# Patient Record
Sex: Female | Born: 1995 | Race: White | Hispanic: No | Marital: Single | Smoking: Never smoker
Health system: Southern US, Community
[De-identification: ages and names within clinical notes are randomized; demographics above are authoritative.]

## PROBLEM LIST (undated history)

## (undated) DIAGNOSIS — J3081 Allergic rhinitis due to animal (cat) (dog) hair and dander: Secondary | ICD-10-CM

## (undated) DIAGNOSIS — E119 Type 2 diabetes mellitus without complications: Secondary | ICD-10-CM

## (undated) DIAGNOSIS — I10 Essential (primary) hypertension: Secondary | ICD-10-CM

## (undated) DIAGNOSIS — J45909 Unspecified asthma, uncomplicated: Secondary | ICD-10-CM

## (undated) DIAGNOSIS — T7840XA Allergy, unspecified, initial encounter: Secondary | ICD-10-CM

## (undated) DIAGNOSIS — F32A Depression, unspecified: Secondary | ICD-10-CM

## (undated) DIAGNOSIS — F329 Major depressive disorder, single episode, unspecified: Secondary | ICD-10-CM

## (undated) HISTORY — DX: Major depressive disorder, single episode, unspecified: F32.9

## (undated) HISTORY — DX: Essential (primary) hypertension: I10

## (undated) HISTORY — DX: Allergy, unspecified, initial encounter: T78.40XA

## (undated) HISTORY — DX: Allergic rhinitis due to animal (cat) (dog) hair and dander: J30.81

## (undated) HISTORY — PX: ADENOIDECTOMY: SUR15

## (undated) HISTORY — DX: Depression, unspecified: F32.A

## (undated) HISTORY — DX: Unspecified asthma, uncomplicated: J45.909

---
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
## 2013-08-05 ENCOUNTER — Ambulatory Visit: Payer: Self-pay | Admitting: Unknown Physician Specialty

## 2013-09-15 ENCOUNTER — Encounter: Payer: Self-pay | Admitting: Internal Medicine

## 2013-09-15 ENCOUNTER — Encounter: Payer: Self-pay | Admitting: *Deleted

## 2013-09-15 ENCOUNTER — Ambulatory Visit (INDEPENDENT_AMBULATORY_CARE_PROVIDER_SITE_OTHER): Payer: BC Managed Care – PPO | Admitting: Internal Medicine

## 2013-09-15 ENCOUNTER — Other Ambulatory Visit (INDEPENDENT_AMBULATORY_CARE_PROVIDER_SITE_OTHER): Payer: BC Managed Care – PPO

## 2013-09-15 VITALS — BP 130/78 | HR 69 | Ht 65.0 in | Wt 236.0 lb

## 2013-09-15 DIAGNOSIS — R05 Cough: Secondary | ICD-10-CM

## 2013-09-15 DIAGNOSIS — J45909 Unspecified asthma, uncomplicated: Secondary | ICD-10-CM

## 2013-09-15 LAB — CBC WITH DIFFERENTIAL/PLATELET
Basophils Relative: 0.6 % (ref 0.0–3.0)
Eosinophils Relative: 1.2 % (ref 0.0–5.0)
HCT: 36.6 % (ref 36.0–46.0)
Lymphocytes Relative: 36.8 % (ref 12.0–46.0)
MCHC: 33.2 g/dL (ref 30.0–36.0)
Monocytes Absolute: 0.3 10*3/uL (ref 0.1–1.0)
Neutro Abs: 3.4 10*3/uL (ref 1.4–7.7)
Neutrophils Relative %: 55.7 % (ref 43.0–77.0)
RBC: 4.91 Mil/uL (ref 3.87–5.11)
RDW: 17.7 % — ABNORMAL HIGH (ref 11.5–14.6)
WBC: 6 10*3/uL (ref 4.5–10.5)

## 2013-09-15 MED ORDER — BECLOMETHASONE DIPROPIONATE 80 MCG/ACT IN AERS: 2.0000 | INHALATION_SPRAY | Freq: Two times a day (BID) | RESPIRATORY_TRACT | Status: DC

## 2013-09-15 NOTE — Progress Notes (Signed)
  Subjective:    [name], female    DOB: Mar 13, 1996  MRN: [id number]  HPI  63 yowf never smoker with lots of cough and sinus problems esp in spring and fall  as infant seen by [facility] allergy ENT pos tests but never shots some better after adenoids out and rx with nasal sprays and eventually stopped them but ever since then in spring and fall lots of problems with nasal congestion and lots of sneezing and fall of 2014  started with shortness of breath, cough and wheezing better on inhalers and referred 09/15/2013   09/15/2013 1st [place] [facility] office visit/ Sherene Sires / on [facility] Chief Complaint  Patient presents with  . Pulmonary Consult    Self referral.  Sob w/ exertion  does ok at rest and lying down but variable doe depending on speed and incline x months, some better p saba  inhalers but has never rechallenged.  Give aerospan but not using consistently  No obvious pattern in  day to day or daytime variabilty or assoc chronic cough or cp or chest tightness, subjective wheeze overt   hb symptoms. No unusual exp hx or h/o childhood pna/ asthma or knowledge of premature birth.  Sleeping ok without nocturnal  or early am exacerbation  of respiratory  c/o's or need for noct saba. Also denies any obvious fluctuation of symptoms with weather or environmental changes or other aggravating or alleviating factors except as outlined above   Current Medications, Allergies, Complete Past Medical History, Past Surgical History, Family History, and Social History were reviewed in [employer] record.   .       Review of Systems  Constitutional: Negative for fever and unexpected weight change.  HENT: Positive for congestion, sinus pressure and sneezing. Negative for dental problem, ear pain, nosebleeds, postnasal drip, rhinorrhea, sore throat and trouble swallowing.   Eyes: Negative for redness and itching.  Respiratory: Positive for cough, chest tightness, shortness  of breath and wheezing.   Cardiovascular: Negative for palpitations and leg swelling.  Gastrointestinal: Negative for nausea and vomiting.  Genitourinary: Negative for dysuria.  Musculoskeletal: Negative for joint swelling.  Skin: Positive for rash.  Neurological: Positive for headaches.  Hematological: Does not bruise/bleed easily.  Psychiatric/Behavioral: Positive for dysphoric mood. The patient is nervous/anxious.        Objective:   Physical Exam  amb obese wf nad Wt Readings from Last 3 Encounters:  09/15/13 236 lb (107.049 kg) (99%*, Z = 2.37)   * Growth percentiles are based on CDC 2-20 Years data.      HEENT: nl dentition, turbinates, and orophanx. Nl external ear canals without cough reflex   NECK :  without JVD/Nodes/TM/ nl carotid upstrokes bilaterally   LUNGS: no acc muscle use, clear to A and P bilaterally without cough on insp or exp maneuvers   CV:  RRR  no s3 or murmur or increase in P2, no edema   ABD:  soft and nontender with nl excursion in the supine position. No bruits or organomegaly, bowel sounds nl  MS:  warm without deformities, calf tenderness, cyanosis or clubbing  SKIN: warm and dry without lesions    NEURO:  alert, approp, no deficits    Spirometry 09/05/13 wnl     Assessment & Plan:

## 2013-09-15 NOTE — Patient Instructions (Signed)
Start Qvar 80 Take 2 puffs first thing in am and then another 2 puffs about 12 hours later.   Only use your albuterol as a rescue medication to be used if you can't catch your breath by resting or doing a relaxed purse lip breathing pattern.  - The less you use it, the better it will work when you need it. - Ok to use up to every 4 hours if you must but call for immediate appointment if use goes up over your usual need - Don't leave home without it !!  (think of it like your spare tire for your car)   You appear to have mild chronic asthma which should respond to appropriate maintenance inhalers like qvar but may need to be adjusted at your next visit in 4 weeks until we get the prescription right for you.  In the meantime you may be limited in terms of exertion especially up hills or in the cold and your teachers need to be aware of your limitations caused by asthma.  Please remember to go to the lab   department downstairs for your tests - we will call you with the results when they are available.  Please schedule a follow up office visit in 4 weeks, sooner if needed

## 2013-09-16 ENCOUNTER — Other Ambulatory Visit: Payer: Self-pay | Admitting: Internal Medicine

## 2013-09-16 ENCOUNTER — Encounter: Payer: Self-pay | Admitting: Internal Medicine

## 2013-09-16 ENCOUNTER — Telehealth: Payer: Self-pay | Admitting: Internal Medicine

## 2013-09-16 ENCOUNTER — Other Ambulatory Visit (INDEPENDENT_AMBULATORY_CARE_PROVIDER_SITE_OTHER): Payer: BC Managed Care – PPO

## 2013-09-16 ENCOUNTER — Ambulatory Visit (INDEPENDENT_AMBULATORY_CARE_PROVIDER_SITE_OTHER)
Admission: RE | Admit: 2013-09-16 | Discharge: 2013-09-16 | Disposition: A | Discharge status: Home / Self Care | Payer: BC Managed Care – PPO | Source: Ambulatory Visit | Attending: Internal Medicine | Admitting: Internal Medicine

## 2013-09-16 DIAGNOSIS — R791 Abnormal coagulation profile: Secondary | ICD-10-CM

## 2013-09-16 DIAGNOSIS — R0600 Dyspnea, unspecified: Secondary | ICD-10-CM | POA: Insufficient documentation

## 2013-09-16 DIAGNOSIS — R0609 Other forms of dyspnea: Secondary | ICD-10-CM

## 2013-09-16 DIAGNOSIS — R7989 Other specified abnormal findings of blood chemistry: Secondary | ICD-10-CM

## 2013-09-16 LAB — ALLERGY PROFILE REGION II-DC, DE, MD, ~~LOC~~, VA
Allergen, D pternoyssinus,d7: 18.1 kU/L — ABNORMAL HIGH
Aspergillus fumigatus, m3: <0.1 kU/L
Bermuda Grass: 12.5 kU/L — ABNORMAL HIGH
Box Elder IgE: <0.1 kU/L
Cladosporium Herbarum: <0.1 kU/L
Cockroach: <0.1 kU/L
Common Ragweed: <0.1 kU/L
Lamb's Quarters: 0.15 kU/L — ABNORMAL HIGH
Meadow Grass: 38.8 kU/L — ABNORMAL HIGH
Oak: <0.1 kU/L

## 2013-09-16 LAB — BASIC METABOLIC PANEL
CO2: 23 mEq/L (ref 19–32)
Chloride: 106 mEq/L (ref 96–112)
Glucose, Bld: 120 mg/dL — ABNORMAL HIGH (ref 70–99)
Potassium: 4.2 mEq/L (ref 3.5–5.1)
Sodium: 136 mEq/L (ref 135–145)

## 2013-09-16 MED ORDER — IOHEXOL 350 MG/ML SOLN
80.0000 mL | Freq: Once | INTRAVENOUS | Status: AC | PRN
  Administered 2013-09-16: 80 mL via INTRAVENOUS

## 2013-09-16 NOTE — Telephone Encounter (Signed)
Letter printed and signed giving to pt. Nothing further needed

## 2013-09-16 NOTE — Telephone Encounter (Signed)
Pt was made aware of normal CT results.

## 2013-09-16 NOTE — Progress Notes (Signed)
Quick Note:  Spoke with the pt's mother and notified of recs per MW  She verbalized understanding  Order sent to [facility]- per Almyra Free the VQ can not be done today, so cta ordered ______

## 2013-09-16 NOTE — Telephone Encounter (Signed)
Chest ct today Amy Olson

## 2013-09-16 NOTE — Progress Notes (Signed)
Quick Note:  LMTCB on pt's cell  Called number listed for mother and it has been d/c'ed  WCB ______

## 2013-09-16 NOTE — Progress Notes (Signed)
Quick Note:  Spoke with pt and notified of results per Dr. Wert. Pt verbalized understanding and denied any questions.  ______ 

## 2013-09-16 NOTE — Telephone Encounter (Signed)
I called and spoke with pt mother. She reports she had a phone call from [place]. She thinks it was to scheduled pt CTa scan. Please advise PCC's thanks

## 2013-09-16 NOTE — Assessment & Plan Note (Addendum)
-   Spirometry wnl 09/15/2013 including fef 25-75 s rx x > 12 hours   Symptoms are markedly disproportionate to objective findings and not clear this is a lung problem but pt does appear to have difficult airway management issues. DDX of  difficult airways managment all start with A and  include Adherence, Ace Inhibitors, Acid Reflux, Active Sinus Disease, Alpha 1 Antitripsin deficiency, Anxiety masquerading as Airways dz,  ABPA,  allergy(esp in young), Aspiration (esp in elderly), Adverse effects of DPI,  Active smokers, plus two Bs  = Bronchiectasis and Beta blocker use..and one C= CHF  In this case Adherence is the biggest issue and starts with  inability to use HFA effectively and also  understand that SABA treats the symptoms but doesn't get to the underlying problem (inflammation).  I used  the analogy of putting steroid cream on a rash to help explain the meaning of topical therapy and the need to get the drug to the target tissue.   - try qvar 80 2bid and see if this reduces saba use - The proper method of use, as well as anticipated side effects, of a metered-dose inhaler are discussed and demonstrated to the patient. Improved effectiveness after extensive coaching during this visit to a level of approximately  75%   ? Anxiety / obesity/ deconditioning all on list also >  Dx of exclusion  BCP use raises issue of occult PE and d dimer is slt elevated so needs to be exlcuded; also puts her at risk of Acid and non - acid gerd  See instructions for specific recommendations which were reviewed directly with the patient who was given a copy with highlighter outlining the key components.

## 2013-10-09 ENCOUNTER — Institutional Professional Consult (permissible substitution): Payer: Self-pay | Admitting: Pulmonary Disease

## 2013-10-13 ENCOUNTER — Ambulatory Visit (INDEPENDENT_AMBULATORY_CARE_PROVIDER_SITE_OTHER): Payer: No Typology Code available for payment source | Admitting: Internal Medicine

## 2013-10-13 ENCOUNTER — Encounter: Payer: Self-pay | Admitting: Internal Medicine

## 2013-10-13 VITALS — BP 126/80 | HR 90 | Temp 99.0°F | Ht 66.0 in | Wt 240.0 lb

## 2013-10-13 DIAGNOSIS — J45909 Unspecified asthma, uncomplicated: Secondary | ICD-10-CM

## 2013-10-13 MED ORDER — BECLOMETHASONE DIPROPIONATE 80 MCG/ACT IN AERS: 2.0000 | INHALATION_SPRAY | Freq: Two times a day (BID) | RESPIRATORY_TRACT | Status: DC

## 2013-10-13 NOTE — Progress Notes (Signed)
Subjective:    [name], female    DOB: Jun 05, 1996  MRN: [id number]  HPI  72 yowf never smoker with lots of cough and sinus problems esp in spring and fall  as infant seen by [facility] allergy ENT pos tests but never shots some better after adenoids out and rx with nasal sprays and eventually stopped them but ever since then in spring and fall lots of problems with nasal congestion and lots of sneezing and fall of 2014  started with shortness of breath, cough and wheezing better on inhalers and referred 09/15/2013   09/15/2013 1st [place] [facility] office visit/ Sherene Sires / on [facility] Chief Complaint  Patient presents with  . Pulmonary Consult    Self referral.  Sob w/ exertion  does ok at rest and lying down but variable doe depending on speed and incline x months, some better p saba  inhalers but has never rechallenged.  Give aerospan but not using consistently. rec Start Qvar 80 Take 2 puffs first thing in am and then another 2 puffs about 12 hours later.  Only use your albuterol   You appear to have mild chronic asthma    10/13/2013 f/u ov/[place] re: asthma Chief Complaint  Patient presents with  . Follow-up    Pt states her breathing is doing much better since last visit. Has only used rescue inhaler x 1 since her last visit. No new co's today.     Now able to jog.  No obvious day to day or daytime variabilty or assoc chronic cough or cp or chest tightness, subjective wheeze overt sinus or hb symptoms. No unusual exp hx or h/o childhood pna/ asthma or knowledge of premature birth.  Sleeping ok without nocturnal  or early am exacerbation  of respiratory  c/o's or need for noct saba. Also denies any obvious fluctuation of symptoms with weather or environmental changes or other aggravating or alleviating factors except as outlined above   Current Medications, Allergies, Complete Past Medical History, Past Surgical History, Family History, and Social History were reviewed in  [employer] record.  ROS  The following are not active complaints unless bolded sore throat, dysphagia, dental problems, itching, sneezing,  nasal congestion or excess/ purulent secretions, ear ache,   fever, chills, sweats, unintended wt loss, pleuritic or exertional cp, hemoptysis,  orthopnea pnd or leg swelling, presyncope, palpitations, heartburn, abdominal pain, anorexia, nausea, vomiting, diarrhea  or change in bowel or urinary habits, change in stools or urine, dysuria,hematuria,  rash, arthralgias, visual complaints, headache, numbness weakness or ataxia or problems with walking or coordination,  change in mood/affect or memory.           .              Objective:   Physical Exam  amb obese wf nad  Wt Readings from Last 3 Encounters:  10/13/13 240 lb (108.863 kg) (99%*, Z = 2.41)  09/15/13 236 lb (107.049 kg) (99%*, Z = 2.37)   * Growth percentiles are based on CDC 2-20 Years data.     HEENT: nl dentition, turbinates, and orophanx. Nl external ear canals without cough reflex   NECK :  without JVD/Nodes/TM/ nl carotid upstrokes bilaterally   LUNGS: no acc muscle use, clear to A and P bilaterally without cough on insp or exp maneuvers   CV:  RRR  no s3 or murmur or increase in P2, no edema   ABD:  soft and nontender with nl excursion  in the supine position. No bruits or organomegaly, bowel sounds nl  MS:  warm without deformities, calf tenderness, cyanosis or clubbing  SKIN: warm and dry without lesions      CTa 09/16/13 Negative for pulmonary embolus.  Trace right pleural effusion.  Fatty infiltration of the liver.     Spirometry 09/05/13 wnl     Assessment & Plan:

## 2013-10-13 NOTE — Patient Instructions (Addendum)
continue qvar 80 Take 2 puffs first thing in am and then another 2 puffs about 12 hours later.    Please schedule a follow up visit in 3 months but call sooner if needed

## 2013-10-13 NOTE — Assessment & Plan Note (Signed)
-   Spirometry wnl 09/15/2013 including fef 25-75 s rx x > 12 hours  - Allergy profile 09/15/13 > IgE 119.7 grass, cat dog dust and pecan trees  The proper method of use, as well as anticipated side effects, of a metered-dose inhaler are discussed and demonstrated to the patient. Improved effectiveness after extensive coaching during this visit to a level of approximately  90%  All goals of chronic asthma control met including optimal function and elimination of symptoms with minimal need for rescue therapy.  Contingencies discussed in full including contacting this office immediately if not controlling the symptoms using the rule of two's.     See instructions for specific recommendations which were reviewed directly with the patient who was given a copy with highlighter outlining the key components.

## 2013-12-14 ENCOUNTER — Encounter (HOSPITAL_COMMUNITY): Payer: Self-pay | Admitting: Emergency Medicine

## 2013-12-14 ENCOUNTER — Emergency Department (HOSPITAL_COMMUNITY)
Admission: EM | Admit: 2013-12-14 | Discharge: 2013-12-14 | Disposition: A | Discharge status: Home / Self Care | Payer: No Typology Code available for payment source | Source: Home / Self Care | Attending: Family Medicine | Admitting: Family Medicine

## 2013-12-14 DIAGNOSIS — J329 Chronic sinusitis, unspecified: Secondary | ICD-10-CM

## 2013-12-14 MED ORDER — IPRATROPIUM BROMIDE 0.06 % NA SOLN: 2.0000 | Freq: Four times a day (QID) | NASAL | Status: DC

## 2013-12-14 MED ORDER — PREDNISONE 10 MG PO TABS: 30.0000 mg | ORAL_TABLET | Freq: Every day | ORAL | Status: DC

## 2013-12-14 MED ORDER — AMOXICILLIN-POT CLAVULANATE 875-125 MG PO TABS: 1.0000 | ORAL_TABLET | Freq: Two times a day (BID) | ORAL | Status: DC

## 2013-12-14 NOTE — Discharge Instructions (Signed)
Thank you for coming in today. Take Augmentin twice daily for one week. Use of prednisone daily for 5 days. Use Atrovent nasal spray as needed. Take up to 2 Aleve twice daily for pain fevers or chills. Call or go to the emergency room if you get worse, have trouble breathing, have chest pains, or palpitations.   Sinusitis Sinusitis is redness, soreness, and swelling (inflammation) of the paranasal sinuses. Paranasal sinuses are air pockets within the bones of your face (beneath the eyes, the middle of the forehead, or above the eyes). In healthy paranasal sinuses, mucus is able to drain out, and air is able to circulate through them by way of your nose. However, when your paranasal sinuses are inflamed, mucus and air can become trapped. This can allow bacteria and other germs to grow and cause infection. Sinusitis can develop quickly and last only a short time (acute) or continue over a long period (chronic). Sinusitis that lasts for more than 12 weeks is considered chronic.  CAUSES  Causes of sinusitis include:  Allergies.  Structural abnormalities, such as displacement of the cartilage that separates your nostrils (deviated septum), which can decrease the air flow through your nose and sinuses and affect sinus drainage.  Functional abnormalities, such as when the small hairs (cilia) that line your sinuses and help remove mucus do not work properly or are not present. SYMPTOMS  Symptoms of acute and chronic sinusitis are the same. The primary symptoms are pain and pressure around the affected sinuses. Other symptoms include:  Upper toothache.  Earache.  Headache.  Bad breath.  Decreased sense of smell and taste.  A cough, which worsens when you are lying flat.  Fatigue.  Fever.  Thick drainage from your nose, which often is green and may contain pus (purulent).  Swelling and warmth over the affected sinuses. DIAGNOSIS  Your caregiver will perform a physical exam. During the  exam, your caregiver may:  Look in your nose for signs of abnormal growths in your nostrils (nasal polyps).  Tap over the affected sinus to check for signs of infection.  View the inside of your sinuses (endoscopy) with a special imaging device with a light attached (endoscope), which is inserted into your sinuses. If your caregiver suspects that you have chronic sinusitis, one or more of the following tests may be recommended:  Allergy tests.  Nasal culture A sample of mucus is taken from your nose and sent to a lab and screened for bacteria.  Nasal cytology A sample of mucus is taken from your nose and examined by your caregiver to determine if your sinusitis is related to an allergy. TREATMENT  Most cases of acute sinusitis are related to a viral infection and will resolve on their own within 10 days. Sometimes medicines are prescribed to help relieve symptoms (pain medicine, decongestants, nasal steroid sprays, or saline sprays).  However, for sinusitis related to a bacterial infection, your caregiver will prescribe antibiotic medicines. These are medicines that will help kill the bacteria causing the infection.  Rarely, sinusitis is caused by a fungal infection. In theses cases, your caregiver will prescribe antifungal medicine. For some cases of chronic sinusitis, surgery is needed. Generally, these are cases in which sinusitis recurs more than 3 times per year, despite other treatments. HOME CARE INSTRUCTIONS   Drink plenty of water. Water helps thin the mucus so your sinuses can drain more easily.  Use a humidifier.  Inhale steam 3 to 4 times a day (for example, sit in  the bathroom with the shower running).  Apply a warm, moist washcloth to your face 3 to 4 times a day, or as directed by your caregiver.  Use saline nasal sprays to help moisten and clean your sinuses.  Take over-the-counter or prescription medicines for pain, discomfort, or fever only as directed by your  caregiver. SEEK IMMEDIATE MEDICAL CARE IF:  You have increasing pain or severe headaches.  You have nausea, vomiting, or drowsiness.  You have swelling around your face.  You have vision problems.  You have a stiff neck.  You have difficulty breathing. MAKE SURE YOU:   Understand these instructions.  Will watch your condition.  Will get help right away if you are not doing well or get worse. Document Released: 10/09/2005 Document Revised: 01/01/2012 Document Reviewed: 10/24/2011 [facility] Patient Information 2014 [employer] [place].

## 2013-12-14 NOTE — ED Provider Notes (Signed)
Amy Olson is a 18 y.o. female who presents to Urgent Care today for left facial pain and pressure with nasal discharge. This is been mildly present for the past 2 weeks however she was improving until about 2 days ago when she became rapidly worse. She's tried some over-the-counter medications which have not helped much. She denies any trouble breathing fever chills nausea vomiting or diarrhea. She has a history of sinusitis in her current symptoms are consistent with prior episodes of sinus infections.   Past Medical History  Diagnosis Date  . allergies    History  Substance Use Topics  . Smoking status: Never Smoker   . Smokeless tobacco: Not on file  . Alcohol Use: No   ROS as above Medications: No current facility-administered medications for this encounter.   Current Outpatient Prescriptions  Medication Sig Dispense Refill  . albuterol (PROVENTIL HFA;VENTOLIN HFA) 108 (90 BASE) MCG/ACT inhaler Inhale 2 puffs into the lungs every 6 (six) hours as needed for wheezing or shortness of breath.      . beclomethasone (QVAR) 80 MCG/ACT inhaler Inhale 2 puffs into the lungs 2 (two) times daily. Take 2 puffs first thing in am and then another 2 puffs about 12 hours later.  1 Inhaler  3  . desogestrel-ethinyl estradiol (APRI) 0.15-30 MG-MCG tablet Take 1 tablet by mouth daily.      Marland Kitchen. escitalopram (LEXAPRO) 10 MG tablet Take 10 mg by mouth daily.      . metFORMIN (GLUCOPHAGE) 500 MG tablet Take 500 mg by mouth 2 (two) times daily with a meal.      . amoxicillin-clavulanate (AUGMENTIN) 875-125 MG per tablet Take 1 tablet by mouth every 12 (twelve) hours.  14 tablet  0  . ipratropium (ATROVENT) 0.06 % nasal spray Place 2 sprays into both nostrils 4 (four) times daily.  15 mL  1  . predniSONE (DELTASONE) 10 MG tablet Take 3 tablets (30 mg total) by mouth daily.  15 tablet  0    Exam:  BP 143/86  Pulse 90  Temp(Src) 98.5 F (36.9 C) (Oral)  Resp 16  SpO2 100%  LMP 12/11/2013 Gen: Well  NAD HEENT: EOMI,  MMM tender palpation left maxillary sinus. Tympanic membranes are normal bilaterally as is posterior pharynx. Clear nasal discharge present Lungs: Normal work of breathing. CTABL Heart: RRR no MRG Abd: NABS, Soft. NT, ND Exts: Brisk capillary refill, warm and well perfused.    Assessment and Plan: 18 y.o. female with sinusitis. Plan to treat with prednisone Augmentin and Atrovent nasal spray. Continue NSAIDs as needed for pain  Discussed warning signs or symptoms. Please see discharge instructions. Patient expresses understanding.    Rodolph BongEvan S Arsal Tappan, MD 12/14/13 [phone]

## 2013-12-14 NOTE — ED Notes (Signed)
C/o sinus pressure and pain.  Post nasal drip.  Fever.  Sneezing.  Green mucus.  And nausea.  Symptoms present x 2 wks.  Denies v/d.     Mild relief with otc meds.

## 2013-12-30 ENCOUNTER — Ambulatory Visit (INDEPENDENT_AMBULATORY_CARE_PROVIDER_SITE_OTHER): Payer: No Typology Code available for payment source | Admitting: Internal Medicine

## 2013-12-30 ENCOUNTER — Encounter: Payer: Self-pay | Admitting: Internal Medicine

## 2013-12-30 VITALS — BP 130/84 | HR 87 | Temp 98.7°F | Ht 66.0 in | Wt 244.8 lb

## 2013-12-30 DIAGNOSIS — J45909 Unspecified asthma, uncomplicated: Secondary | ICD-10-CM

## 2013-12-30 NOTE — Patient Instructions (Addendum)
continue qvar 80 Take 2 puffs first thing in am and then another 2 puffs about 12 hours later- ok to drop off pm doses after spring if doing great.   Please schedule a follow up visit in 12 months but call sooner if needed

## 2013-12-30 NOTE — Assessment & Plan Note (Signed)
-   Spirometry wnl 09/15/2013 including fef 25-75 s rx x > 12 hours  - Allergy profile 09/15/13 > IgE 119.7 grass, cat dog dust and pecan trees - 90% 10/13/2013   Adequate control on present rx, reviewed > no change in rx needed  Except ok to reduce the qvar to 80 2 puffs each am once spring over

## 2013-12-30 NOTE — Progress Notes (Signed)
Subjective:    [name], female    DOB: 19-Sep-1996  MRN: [id number]    Brief patient profile:  [age] yowf never smoker with lots of cough and sinus problems esp in spring and fall  as infant seen by [place] allergy ENT pos tests but never shots some better after adenoids out and rx with nasal sprays and eventually stopped them but ever since then in spring and fall lots of problems with nasal congestion and lots of sneezing and fall of 2014  started with shortness of breath, cough and wheezing better on inhalers and referred 09/15/2013    History of Present Illness  09/15/2013 1st [place] [name]  Patient presents with  . Pulmonary Consult    Self referral.  Sob w/ exertion  does ok at rest and lying down but variable doe depending on speed and incline x months, some better p saba  inhalers but has never rechallenged.  Give aerospan but not using consistently. rec Start Qvar 80 Take 2 puffs first thing in am and then another 2 puffs about 12 hours later.  Only use your albuterol   You appear to have mild chronic asthma    10/13/2013 f/u ov/[place] re: asthma Chief Complaint  Patient presents with  . Follow-up    Pt states her breathing is doing much better since last visit. Has only used rescue inhaler x 1 since her last visit. No new co's today.   Now able to jog rec continue qvar 80 Take 2 puffs first thing in am and then another 2 puffs about 12 hours later    12/30/2013 f/u ov/[facility] re: asthma well controlled on qvar 80 2bid  Chief Complaint  Patient presents with  . Follow-up    Pt states her breathing is doing well. She has used rescue inhaler x 1 only since last visit.      Not limited by breathing from desired activities    No obvious day to day or daytime variabilty or assoc chronic cough or cp or chest tightness, subjective wheeze overt sinus or hb symptoms. No unusual exp hx or h/o childhood pna/ asthma or  knowledge of premature birth.  Sleeping ok without nocturnal  or early am exacerbation  of respiratory  c/o's or need for noct saba. Also denies any obvious fluctuation of symptoms with weather or environmental changes or other aggravating or alleviating factors except as outlined above   Current Medications, Allergies, Complete Past Medical History, Past Surgical History, Family History, and Social History were [facility].  ROS  The following are not active complaints unless bolded sore throat, dysphagia, dental problems, itching, sneezing,  nasal congestion or excess/ purulent secretions, ear ache,   fever, chills, sweats, unintended wt loss, pleuritic or exertional cp, hemoptysis,  orthopnea pnd or leg swelling, presyncope, palpitations, heartburn, abdominal pain, anorexia, nausea, vomiting, diarrhea  or change in bowel or urinary habits, change in stools or urine, dysuria,hematuria,  rash, arthralgias, visual complaints, headache, numbness weakness or ataxia or problems with walking or coordination,  change in mood/affect or memory.           .              Objective:   Physical Exam  amb obese wf nad  12/30/2013      245  Wt Readings from Last 3 Encounters:  10/13/13 240 lb (108.863 kg) (99%*, Z = 2.41)  09/15/13 236 lb (107.049 kg) (99%*, Z = 2.37)   * Growth percentiles are based on CDC 2-20 Years data.     HEENT: nl dentition, turbinates, and orophanx. Nl external ear canals without cough reflex   NECK :  without JVD/Nodes/TM/ nl carotid upstrokes bilaterally   LUNGS: no acc muscle use, clear to A and P bilaterally without cough on insp or exp maneuvers   CV:  RRR  no s3 or murmur or increase in P2, no edema   ABD:  soft and nontender with nl excursion in the supine position. No bruits or organomegaly, bowel sounds nl  MS:  warm without deformities, calf tenderness, cyanosis or clubbing  SKIN: warm and dry without lesions       CTa 09/16/13 Negative for pulmonary embolus.  Trace right pleural effusion.  Fatty infiltration of the liver.     Spirometry 09/05/13 wnl     Assessment & Plan:

## 2014-02-13 ENCOUNTER — Telehealth: Payer: Self-pay | Admitting: Internal Medicine

## 2014-02-13 MED ORDER — BECLOMETHASONE DIPROPIONATE 80 MCG/ACT IN AERS: INHALATION_SPRAY | RESPIRATORY_TRACT | Status: DC

## 2014-02-13 NOTE — Telephone Encounter (Signed)
Spoke with pt mother. Aware QVAR has been sent for 90 day supply. Nothing further needed

## 2014-03-24 ENCOUNTER — Telehealth: Payer: Self-pay | Admitting: Internal Medicine

## 2014-03-24 ENCOUNTER — Encounter: Payer: Self-pay | Admitting: *Deleted

## 2014-03-24 NOTE — Telephone Encounter (Signed)
I'm ok with statement  1) pt is compliant 2) historically has had very poorly controlled asthma (though note she was doing great last ov so can't say anything specific about her status after last ov) 3) She may be prone to missing school when asthma active 4) Her asthma may be triggered with atmospheric changes including living at higher elevations.  Her lawyer will probably want her record so they need to be aware that at her last ov the record reflects she was doing very well which won't help her case from that point forward and no phone calls documented subsequent to that ov to point otherwise.Marland Kitchen

## 2014-03-24 NOTE — Telephone Encounter (Signed)
Letter completed  Spoke with Toniann Fail and notified her of recs per MW She verbalized understanding  I have faxed the letter to Toniann Fail per her request to [phone]  Nothing further needed at this time
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
## 2014-08-11 ENCOUNTER — Telehealth: Payer: Self-pay | Admitting: Internal Medicine

## 2014-08-11 MED ORDER — ALBUTEROL SULFATE HFA 108 (90 BASE) MCG/ACT IN AERS: 2.0000 | INHALATION_SPRAY | Freq: Four times a day (QID) | RESPIRATORY_TRACT | Status: DC | PRN

## 2014-08-11 NOTE — Telephone Encounter (Signed)
Called and spoke to pt's mother. Mother requesting refill for albuterol hfa. Refill sent to preferred pharmacy. Nothing further needed.

## 2015-07-29 ENCOUNTER — Encounter: Payer: Self-pay | Admitting: *Deleted

## 2015-07-29 ENCOUNTER — Ambulatory Visit
Admission: EM | Admit: 2015-07-29 | Discharge: 2015-07-29 | Disposition: A | Discharge status: Home / Self Care | Payer: 59 | Attending: Family Medicine | Admitting: Family Medicine

## 2015-07-29 DIAGNOSIS — H6591 Unspecified nonsuppurative otitis media, right ear: Secondary | ICD-10-CM

## 2015-07-29 DIAGNOSIS — J0100 Acute maxillary sinusitis, unspecified: Secondary | ICD-10-CM

## 2015-07-29 HISTORY — DX: Type 2 diabetes mellitus without complications: E11.9

## 2015-07-29 MED ORDER — AZITHROMYCIN 250 MG PO TABS: ORAL_TABLET | ORAL | Status: DC

## 2015-07-29 NOTE — ED Provider Notes (Signed)
CSN: [id number]     Arrival date & time 07/29/15  1731 History   First MD Initiated Contact with Patient 07/29/15 1812     Chief Complaint  Patient presents with  . Facial Pain  . Headache   (Consider location/radiation/quality/duration/timing/severity/associated sxs/prior Treatment) HPI   This a 19 year old female accompanied by her mother who presents with a week to week and a half history of sinus pressure green mucus headache and a painful clogged right ear. She states that it all started with nasal drainage and now has a sore throat. She denies any fever or chills. She is here for weekend from [facility].  Past Medical History  Diagnosis Date  . allergies   . Diabetes mellitus without complication [facility])    Past Surgical History  Procedure Laterality Date  . Adenoidectomy      1998   Family History  Problem Relation Age of Onset  . Rheum arthritis Maternal Grandmother    Social History  Substance Use Topics  . Smoking status: Never Smoker   . Smokeless tobacco: None  . Alcohol Use: No   OB History    No data available     Review of Systems  Constitutional: Negative for fever, chills, diaphoresis and fatigue.  HENT: Positive for congestion, ear pain, postnasal drip, sinus pressure and sore throat.   Respiratory: Negative for cough, choking and shortness of breath.   Skin: Negative.   Allergic/Immunologic: Positive for environmental allergies and immunocompromised state.  All other systems reviewed and are negative.   Allergies  Augmentin  Home Medications   Prior to Admission medications   Medication Sig Start Date End Date Taking? Authorizing Provider  albuterol (PROVENTIL HFA;VENTOLIN HFA) 108 (90 BASE) MCG/ACT inhaler Inhale 2 puffs into the lungs every 6 (six) hours as needed for wheezing or shortness of breath. 08/11/14  Yes Nyoka Cowden, MD  beclomethasone (QVAR) 80 MCG/ACT inhaler Take 2 puffs first thing in am and then another 2  puffs about 12 hours later. 02/13/14  Yes Nyoka Cowden, MD  desogestrel-ethinyl estradiol (APRI) 0.15-30 MG-MCG tablet Take 1 tablet by mouth daily.   Yes Historical Provider, MD  escitalopram (LEXAPRO) 10 MG tablet Take 20 mg by mouth daily.    Yes Historical Provider, MD  Homeopathic Products (OSCILLOCOCCINUM PO) Take by mouth.   Yes Historical Provider, MD  ipratropium (ATROVENT) 0.06 % nasal spray Place 2 sprays into both nostrils 4 (four) times daily. 12/14/13  Yes Rodolph Bong, MD  lisdexamfetamine (VYVANSE) 20 MG capsule Take 20 mg by mouth daily.   Yes Historical Provider, MD  loratadine (CLARITIN) 10 MG tablet Take 10 mg by mouth daily.   Yes Historical Provider, MD  metFORMIN (GLUCOPHAGE) 500 MG tablet Take 500 mg by mouth 2 (two) times daily with a meal.   Yes Historical Provider, MD  azithromycin (ZITHROMAX Z-PAK) 250 MG tablet Use per package instructions 07/29/15   Lutricia Feil, PA-C   Meds Ordered and Administered this Visit  Medications - No data to display  BP 131/103 mmHg  Pulse 90  Temp(Src) 98.4 F (36.9 C) (Oral)  Ht  (1.676 m)  Wt 252 lb (114.306 kg)  BMI 40.69 kg/m2  SpO2 99%  LMP 04/28/2015 (Approximate) No data found.   Physical Exam  Constitutional: She is oriented to person, place, and time. She appears well-developed and well-nourished. No distress.  HENT:  Head: Normocephalic and atraumatic.  Examination of the left ear shows some injection but  no air-fluid levels. Examination of the right ear shows the right TM to be very erythematous with air-fluid level and some bulging. She has tenderness to 2 percussion over the maxillary sinuses.  Neck: Neck supple.  Pulmonary/Chest: Breath sounds normal. No respiratory distress. She has no wheezes. She has no rales.  Musculoskeletal: Normal range of motion. She exhibits no edema or tenderness.  Lymphadenopathy:    She has no cervical adenopathy.  Neurological: She is oriented to person, place, and time.   Skin: Skin is warm. She is not diaphoretic.  Psychiatric: She has a normal mood and affect. Her behavior is normal. Judgment and thought content normal.  Nursing note and vitals reviewed.   ED Course  Procedures (including critical care time)  Labs Review Labs Reviewed - No data to display  Imaging Review No results found.   Visual Acuity Review  Right Eye Distance:   Left Eye Distance:   Bilateral Distance:    Right Eye Near:   Left Eye Near:    Bilateral Near:         MDM   1. Acute maxillary sinusitis, recurrence not specified   2. OME (otitis media with effusion), right    New Prescriptions   AZITHROMYCIN (ZITHROMAX Z-PAK) 250 MG TABLET    Use per package instructions  Plan: 1. Diagnosis reviewed with patient 2. rx as per orders; risks, benefits, potential side effects reviewed with patient 3. Recommend supportive treatment with fluids,rest 4. F/u prn if symptoms worsen or don't improve     Lutricia Feil, PA-C 07/29/15 1842

## 2015-07-29 NOTE — Discharge Instructions (Signed)
Otitis Media, Adult  Otitis media is redness, soreness, and inflammation of the middle ear. Otitis media may be caused by allergies or, most commonly, by infection. Often it occurs as a complication of the common cold.  SIGNS AND SYMPTOMS  Symptoms of otitis media may include:   Earache.   Fever.   Ringing in your ear.   Headache.   Leakage of fluid from the ear.  DIAGNOSIS  To diagnose otitis media, your health care provider will examine your ear with an otoscope. This is an instrument that allows your health care provider to see into your ear in order to examine your eardrum. Your health care provider also will ask you questions about your symptoms.  TREATMENT   Typically, otitis media resolves on its own within 3-5 days. Your health care provider may prescribe medicine to ease your symptoms of pain. If otitis media does not resolve within 5 days or is recurrent, your health care provider may prescribe antibiotic medicines if he or she suspects that a bacterial infection is the cause.  HOME CARE INSTRUCTIONS    If you were prescribed an antibiotic medicine, finish it all even if you start to feel better.   Take medicines only as directed by your health care provider.   Keep all follow-up visits as directed by your health care provider.  SEEK MEDICAL CARE IF:   You have otitis media only in one ear, or bleeding from your nose, or both.   You notice a lump on your neck.   You are not getting better in 3-5 days.   You feel worse instead of better.  SEEK IMMEDIATE MEDICAL CARE IF:    You have pain that is not controlled with medicine.   You have swelling, redness, or pain around your ear or stiffness in your neck.   You notice that part of your face is paralyzed.   You notice that the bone behind your ear (mastoid) is tender when you touch it.  MAKE SURE YOU:    Understand these instructions.   Will watch your condition.   Will get help right away if you are not doing well or get worse.     This  information is not intended to replace advice given to you by your health care provider. Make sure you discuss any questions you have with your health care provider.     Document Released: 07/14/2004 Document Revised: 10/30/2014 Document Reviewed: 05/06/2013  Elsevier Interactive Patient Education 2016 Elsevier Inc.  Sinusitis, Adult  Sinusitis is redness, soreness, and inflammation of the paranasal sinuses. Paranasal sinuses are air pockets within the bones of your face. They are located beneath your eyes, in the middle of your forehead, and above your eyes. In healthy paranasal sinuses, mucus is able to drain out, and air is able to circulate through them by way of your nose. However, when your paranasal sinuses are inflamed, mucus and air can become trapped. This can allow bacteria and other germs to grow and cause infection.  Sinusitis can develop quickly and last only a short time (acute) or continue over a long period (chronic). Sinusitis that lasts for more than 12 weeks is considered chronic.  CAUSES  Causes of sinusitis include:   Allergies.   Structural abnormalities, such as displacement of the cartilage that separates your nostrils (deviated septum), which can decrease the air flow through your nose and sinuses and affect sinus drainage.   Functional abnormalities, such as when the small hairs (cilia) that   line your sinuses and help remove mucus do not work properly or are not present.  SIGNS AND SYMPTOMS  Symptoms of acute and chronic sinusitis are the same. The primary symptoms are pain and pressure around the affected sinuses. Other symptoms include:   Upper toothache.   Earache.   Headache.   Bad breath.   Decreased sense of smell and taste.   A cough, which worsens when you are lying flat.   Fatigue.   Fever.   Thick drainage from your nose, which often is green and may contain pus (purulent).   Swelling and warmth over the affected sinuses.  DIAGNOSIS  Your health care provider will  perform a physical exam. During your exam, your health care provider may perform any of the following to help determine if you have acute sinusitis or chronic sinusitis:   Look in your nose for signs of abnormal growths in your nostrils (nasal polyps).   Tap over the affected sinus to check for signs of infection.   View the inside of your sinuses using an imaging device that has a light attached (endoscope).  If your health care provider suspects that you have chronic sinusitis, one or more of the following tests may be recommended:   Allergy tests.   Nasal culture. A sample of mucus is taken from your nose, sent to a lab, and screened for bacteria.   Nasal cytology. A sample of mucus is taken from your nose and examined by your health care provider to determine if your sinusitis is related to an allergy.  TREATMENT  Most cases of acute sinusitis are related to a viral infection and will resolve on their own within 10 days. Sometimes, medicines are prescribed to help relieve symptoms of both acute and chronic sinusitis. These may include pain medicines, decongestants, nasal steroid sprays, or saline sprays.  However, for sinusitis related to a bacterial infection, your health care provider will prescribe antibiotic medicines. These are medicines that will help kill the bacteria causing the infection.  Rarely, sinusitis is caused by a fungal infection. In these cases, your health care provider will prescribe antifungal medicine.  For some cases of chronic sinusitis, surgery is needed. Generally, these are cases in which sinusitis recurs more than 3 times per year, despite other treatments.  HOME CARE INSTRUCTIONS   Drink plenty of water. Water helps thin the mucus so your sinuses can drain more easily.   Use a humidifier.   Inhale steam 3-4 times a day (for example, sit in the bathroom with the shower running).   Apply a warm, moist washcloth to your face 3-4 times a day, or as directed by your health care  provider.   Use saline nasal sprays to help moisten and clean your sinuses.   Take medicines only as directed by your health care provider.   If you were prescribed either an antibiotic or antifungal medicine, finish it all even if you start to feel better.  SEEK IMMEDIATE MEDICAL CARE IF:   You have increasing pain or severe headaches.   You have nausea, vomiting, or drowsiness.   You have swelling around your face.   You have vision problems.   You have a stiff neck.   You have difficulty breathing.     This information is not intended to replace advice given to you by your health care provider. Make sure you discuss any questions you have with your health care provider.     Document Released: 10/09/2005 Document Revised:   10/30/2014 Document Reviewed: 10/24/2011  Elsevier Interactive Patient Education 2016 Elsevier Inc.

## 2015-07-29 NOTE — ED Notes (Signed)
Pt states that she is having sinus pressure with headache and clogged right ear.  Started with nasal drainage and congestion last week

## 2015-09-01 ENCOUNTER — Telehealth: Payer: Self-pay | Admitting: Internal Medicine

## 2015-09-01 MED ORDER — ALBUTEROL SULFATE HFA 108 (90 BASE) MCG/ACT IN AERS: 2.0000 | INHALATION_SPRAY | Freq: Four times a day (QID) | RESPIRATORY_TRACT | Status: DC | PRN

## 2015-09-01 MED ORDER — BECLOMETHASONE DIPROPIONATE 80 MCG/ACT IN AERS: INHALATION_SPRAY | RESPIRATORY_TRACT | Status: DC

## 2015-09-01 NOTE — Telephone Encounter (Signed)
Spoke with the pt's mother  She states pt needing rx for Proair and Qvar  Pt has appt pending in Dec 2016  I advised we will refill meds, but must keep appt for refills  She states will make pt aware of this  Rxs sent

## 2015-10-12 ENCOUNTER — Encounter: Payer: Self-pay | Admitting: *Deleted

## 2015-10-12 ENCOUNTER — Encounter: Payer: Self-pay | Admitting: Internal Medicine

## 2015-10-12 ENCOUNTER — Ambulatory Visit (INDEPENDENT_AMBULATORY_CARE_PROVIDER_SITE_OTHER): Payer: No Typology Code available for payment source | Admitting: Internal Medicine

## 2015-10-12 VITALS — BP 126/74 | HR 98 | Ht 66.0 in | Wt 266.0 lb

## 2015-10-12 DIAGNOSIS — J4530 Mild persistent asthma, uncomplicated: Secondary | ICD-10-CM

## 2015-10-12 LAB — NITRIC OXIDE: NITRIC OXIDE: 86

## 2015-10-12 MED ORDER — PREDNISONE 10 MG PO TABS: ORAL_TABLET | ORAL | Status: DC

## 2015-10-12 MED ORDER — MONTELUKAST SODIUM 10 MG PO TABS: 10.0000 mg | ORAL_TABLET | Freq: Every day | ORAL | Status: DC

## 2015-10-12 MED ORDER — CEFDINIR 300 MG PO CAPS: 300.0000 mg | ORAL_CAPSULE | Freq: Two times a day (BID) | ORAL | Status: DC

## 2015-10-12 MED ORDER — BECLOMETHASONE DIPROPIONATE 80 MCG/ACT IN AERS: INHALATION_SPRAY | RESPIRATORY_TRACT | Status: DC
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
## 2015-10-12 NOTE — Patient Instructions (Addendum)
Prednisone 10 mg take  4 each am x 2 days,   2 each am x 2 days,  1 each am x 2 days and stop   singulair 10 mg one daily in pm  clariton 10 mg as needed   If mucus stays yellow rec omnicef 300 mg twice daily x 10 days and if nasal symptoms not better call here for sinus ct or see Dr Jenne CampusMcQueen   Keep the dog out of the bedroom   Please schedule a follow up office visit in 6 weeks, call sooner if needed

## 2015-10-13 DIAGNOSIS — Z6841 Body Mass Index (BMI) 40.0 and over, adult: Secondary | ICD-10-CM

## 2015-10-13 NOTE — Assessment & Plan Note (Addendum)
-   Spirometry wnl 09/15/2013 including fef 25-75 s rx x > 12 hours  - Allergy profile 09/15/13 > IgE 119.7 grass, cat dog dust and pecan trees - spirometry 10/12/2015 w/in one hour of saba FEV1 wnl including mid flows  - NO 10/12/2015 = 86  Symptoms have been difficult to control and are associated with active rhinitis with possible underlying sinusitis that was initially triggered by viral exposure but now may be triggered by additional antigens at home. My concern is that she is overusing albuterol by the rule of twos and we need to step up therapy at this point with multiple options discussed with the patient. Since her airflows are actually normal presently I'm going to recommend adding Singulair 10 mg daily but if this doesn't correct the problem and she needs step up therapy to [facility] or Symbicort.  To treat her acutely rec Prednisone 10 mg take  4 each am x 2 days,   2 each am x 2 days,  1 each am x 2 days and stop   If mucus remains discolored it may suggest underlying sinusitis and she can use Omnicef for 10 days for this and then sinus CT scan next if not cleared.  - The proper method of use, as well as anticipated side effects, of a metered-dose inhaler are discussed and demonstrated to the patient. Improved effectiveness after extensive coaching during this visit to a level of approximately 90 % from a baseline of 75 %   I had an extended discussion with the patient reviewing all relevant studies completed to date and  lasting 15 to 20 minutes of a 25 minute visit    Each maintenance medication was reviewed in detail including most importantly the difference between maintenance and prns and under what circumstances the prns are to be triggered using an action plan format that is not reflected in the computer generated alphabetically organized AVS.    Please see instructions for details which were reviewed in writing and the patient given a copy highlighting the part that I personally  wrote and discussed at today's ov.

## 2015-10-13 NOTE — Assessment & Plan Note (Signed)
Body mass index is 42.95    No results found for: TSH   Contributing to gerd risk / doe/reviewed the need and the process to achieve and maintain neg calorie balance > defer f/u primary care including intermittently monitoring thyroid status

## 2015-11-20 ENCOUNTER — Other Ambulatory Visit: Payer: Self-pay | Admitting: Internal Medicine

## 2015-11-23 ENCOUNTER — Ambulatory Visit: Payer: 59 | Admitting: Internal Medicine

## 2015-11-25 ENCOUNTER — Ambulatory Visit (INDEPENDENT_AMBULATORY_CARE_PROVIDER_SITE_OTHER): Payer: 59 | Admitting: Internal Medicine

## 2015-11-25 ENCOUNTER — Encounter: Payer: Self-pay | Admitting: Internal Medicine

## 2015-11-25 VITALS — BP 130/84 | HR 101 | Ht 66.0 in | Wt 269.6 lb

## 2015-11-25 DIAGNOSIS — J4530 Mild persistent asthma, uncomplicated: Secondary | ICD-10-CM | POA: Diagnosis not present
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
## 2015-11-25 NOTE — Patient Instructions (Signed)
If you are satisfied with your treatment plan,  let your doctor know and he/she can either refill your medications or you can return here when your prescription runs out.     If in any way you are not 100% satisfied,  please tell us.  If 100% better, tell your friends!  Pulmonary follow up is as needed   

## 2015-11-26 ENCOUNTER — Encounter: Payer: Self-pay | Admitting: Internal Medicine

## 2015-11-29 ENCOUNTER — Ambulatory Visit (INDEPENDENT_AMBULATORY_CARE_PROVIDER_SITE_OTHER): Payer: 59 | Admitting: Nurse Practitioner

## 2015-11-29 ENCOUNTER — Encounter: Payer: Self-pay | Admitting: Nurse Practitioner

## 2015-11-29 ENCOUNTER — Encounter: Payer: Self-pay | Admitting: Internal Medicine

## 2015-11-29 VITALS — BP 122/84 | HR 85 | Temp 97.9°F | Resp 14 | Ht 66.0 in | Wt 272.0 lb

## 2015-11-29 DIAGNOSIS — F418 Other specified anxiety disorders: Secondary | ICD-10-CM

## 2015-11-29 DIAGNOSIS — F32A Depression, unspecified: Secondary | ICD-10-CM

## 2015-11-29 DIAGNOSIS — F909 Attention-deficit hyperactivity disorder, unspecified type: Secondary | ICD-10-CM

## 2015-11-29 DIAGNOSIS — F329 Major depressive disorder, single episode, unspecified: Secondary | ICD-10-CM | POA: Diagnosis not present

## 2015-11-29 DIAGNOSIS — Z7689 Persons encountering health services in other specified circumstances: Secondary | ICD-10-CM

## 2015-11-29 DIAGNOSIS — J4530 Mild persistent asthma, uncomplicated: Secondary | ICD-10-CM

## 2015-11-29 DIAGNOSIS — Z7189 Other specified counseling: Secondary | ICD-10-CM | POA: Diagnosis not present

## 2015-11-29 MED ORDER — DESOGESTREL-ETHINYL ESTRADIOL 0.15-30 MG-MCG PO TABS: 1.0000 | ORAL_TABLET | Freq: Every day | ORAL | Status: DC

## 2015-11-29 NOTE — Assessment & Plan Note (Addendum)
I had an extended final summary discussion with the patient reviewing all relevant studies completed to date and  lasting 15 to 20 minutes of a 25 minute visit on the following issues:    All goals of chronic asthma control met including optimal function and elimination of symptoms with minimal need for rescue therapy.on qvar 80 2bid and singulair with atopy also controlled symptomatiically on this regimen  Contingencies discussed in full including contacting this office immediately if not controlling the symptoms using the rule of two's.     Each maintenance medication was reviewed in detail including most importantly the difference between maintenance and as needed and under what circumstances the prns are to be used.  Please see instructions for details which were reviewed in writing and the patient given a copy.

## 2015-11-29 NOTE — Assessment & Plan Note (Signed)
Body mass index is 43.54 trending up  No results found for: TSH   Contributing to gerd tendency/ doe/reviewed the need and the process to achieve and maintain neg calorie balance > defer f/u primary care including intermittently monitoring thyroid status  > Follow up per Primary Care planned

## 2015-11-29 NOTE — Progress Notes (Signed)
[name], female    DOB: 01-14-1996  Age: 20 y.o. MRN: [id number]  CC: Establish Care   HPI Amy Olson presents for establishing care and CC of   1) OCP refills-   May need refills- Apri  LMP- 3 months ago- takes for 3 months then has a period  Had OB/GYN at [facility]- Exercise- Joined a gym recently   Goal- 200 lbs, 3 days a week  Diet- Eats out often   3) ADHD-  Vyvanse- diagnosed in high school Lexapro- 10 mg daily   Depressed- stable, 3 years on lexapro, went to Dr. Dear  Used to see behavioral health   History Amy Olson has a past medical history of allergies; Diabetes mellitus without complication (HCC); Asthma; Depression; and Allergy.   Amy Olson has past surgical history that includes Adenoidectomy.   Amy Olson family history includes Arthritis in Amy Olson maternal grandmother; Diabetes in Amy Olson brother; Hyperlipidemia in Amy Olson maternal grandfather, maternal grandmother, and mother; Hypertension in Amy Olson maternal grandfather and maternal grandmother; Rheum arthritis in Amy Olson maternal grandmother.Amy Olson reports that Amy Olson has never smoked. Amy Olson does not have any smokeless tobacco history on file. Amy Olson reports that Amy Olson drinks alcohol. Amy Olson reports that Amy Olson does not use illicit drugs.  Outpatient Prescriptions Prior to Visit  Medication Sig Dispense Refill  . albuterol (PROAIR HFA) 108 (90 BASE) MCG/ACT inhaler Inhale 2 puffs into the lungs every 6 (six) hours as needed for wheezing or shortness of breath. 1 Inhaler 1  . beclomethasone (QVAR) 80 MCG/ACT inhaler Take 2 puffs first thing in am and then another 2 puffs about 12 hours later. 3 Inhaler 3  . escitalopram (LEXAPRO) 10 MG tablet Take 20 mg by mouth daily.     Marland Kitchen ipratropium (ATROVENT) 0.06 % nasal spray Place 2 sprays into both nostrils 4 (four) times daily. 15 mL 1  . lisdexamfetamine (VYVANSE) 20 MG capsule Take 20 mg by mouth daily.    Marland Kitchen loratadine (CLARITIN) 10 MG tablet Take 10 mg by mouth daily.    . metFORMIN  (GLUCOPHAGE) 500 MG tablet Take 500 mg by mouth 2 (two) times daily with a meal.    . montelukast (SINGULAIR) 10 MG tablet Take 1 tablet (10 mg total) by mouth at bedtime. 30 tablet 11  . desogestrel-ethinyl estradiol (APRI) 0.15-30 MG-MCG tablet Take 1 tablet by mouth daily.     No facility-administered medications prior to visit.    ROS Review of Systems  Constitutional: Negative for fever, chills, diaphoresis and fatigue.  Respiratory: Negative for chest tightness, shortness of breath and wheezing.   Cardiovascular: Negative for chest pain, palpitations and leg swelling.  Gastrointestinal: Negative for nausea, vomiting and diarrhea.  Skin: Negative for rash.  Neurological: Negative for dizziness, weakness, numbness and headaches.  Psychiatric/Behavioral: Positive for decreased concentration. Negative for suicidal ideas and sleep disturbance. The patient is nervous/anxious.     Objective:  BP 122/84 mmHg  Pulse 85  Temp(Src) 97.9 F (36.6 C) (Oral)  Resp 14  Ht  (1.676 m)  Wt 272 lb (123.378 kg)  BMI 43.92 kg/m2  SpO2 97%  LMP 08/30/2015  Physical Exam  Constitutional: Amy Olson is oriented to person, place, and time. Amy Olson appears well-developed and well-nourished. No distress.  HENT:  Head: Normocephalic and atraumatic.  Right Ear: External ear normal.  Left Ear: External ear normal.  Cardiovascular: Normal rate, regular rhythm and normal heart sounds.  Exam reveals no gallop and no friction rub.   No murmur  heard. Pulmonary/Chest: Effort normal and breath sounds normal. No respiratory distress. Amy Olson has no wheezes. Amy Olson has no rales. Amy Olson exhibits no tenderness.  Abdominal:  Obese  Neurological: Amy Olson is alert and oriented to person, place, and time. No cranial nerve deficit. Amy Olson exhibits normal muscle tone. Coordination normal.  Skin: Skin is warm and dry. No rash noted. Amy Olson is not diaphoretic.  Psychiatric: Amy Olson has a normal mood and affect. Amy Olson behavior is normal. Judgment  and thought content normal.   Assessment & Plan:   Amy Olson was seen today for establish care.  Diagnoses and all orders for this visit:  Depression -     Ambulatory referral to Psychology  Extrinsic asthma, mild persistent, uncomplicated  Morbid obesity, unspecified obesity type (HCC)  Encounter to establish care  Attention deficit hyperactivity disorder (ADHD), unspecified ADHD type  Depression with anxiety  Other orders -     desogestrel-ethinyl estradiol (APRI) 0.15-30 MG-MCG tablet; Take 1 tablet by mouth daily.   I am having Amy Olson maintain Amy Olson metFORMIN, escitalopram, ipratropium, lisdexamfetamine, loratadine, albuterol, montelukast, beclomethasone, and desogestrel-ethinyl estradiol.  Meds ordered this encounter  Medications  . desogestrel-ethinyl estradiol (APRI) 0.15-30 MG-MCG tablet    Sig: Take 1 tablet by mouth daily.    Dispense:  3 Package    Refill:  3    Order Specific Question:  Supervising Provider    Answer:  Sherlene Shams [[id number]]     Follow-up: Return in about 2 months (around 01/27/2016) for follow up .

## 2015-11-29 NOTE — Patient Instructions (Signed)
We will call you to get scheduled for a counselor.   Welcome to [facility]! Nice to meet you!

## 2015-12-04 DIAGNOSIS — F909 Attention-deficit hyperactivity disorder, unspecified type: Secondary | ICD-10-CM | POA: Insufficient documentation

## 2015-12-04 DIAGNOSIS — F418 Other specified anxiety disorders: Secondary | ICD-10-CM | POA: Insufficient documentation

## 2015-12-04 DIAGNOSIS — F32A Depression, unspecified: Secondary | ICD-10-CM | POA: Insufficient documentation

## 2015-12-04 DIAGNOSIS — Z0000 Encounter for general adult medical examination without abnormal findings: Secondary | ICD-10-CM | POA: Insufficient documentation

## 2015-12-04 NOTE — Assessment & Plan Note (Signed)
Discussed acute and chronic issues. Reviewed health maintenance measures, PFSHx, and immunizations. Obtain records from previous PCP

## 2015-12-04 NOTE — Assessment & Plan Note (Signed)
Need records prior to prescribing Stable on Vyvanse 20 mg daily currently  Referral to psychology in place FU prn worsening/failure to improve.

## 2015-12-04 NOTE — Assessment & Plan Note (Signed)
Patient stable, followed by Dr. Sherene Sires

## 2015-12-04 NOTE — Assessment & Plan Note (Signed)
We'll check thyroid level at next visit Saw and reviewed Dr. Thurston Hole note on her weight.  Agreed and spoke with pt regarding this Will get full panel of labs at next visit Pt has a current goal of 200 lbs  Working out has not begun, but pt has joined a gym Goal is 3 days a week to work out  [employer] to start low and increase time and effort each week Eating out often- asked her to try new recipes or work on being mindful when eating.  FU in 2 months

## 2015-12-04 NOTE — Assessment & Plan Note (Signed)
Will set up with psychology for counseling Obtain records prior to prescribing medications Pt stable on Lexapro 10 mg  FU in 2 months

## 2015-12-14 ENCOUNTER — Other Ambulatory Visit: Payer: Self-pay | Admitting: Internal Medicine

## 2015-12-14 MED ORDER — MONTELUKAST SODIUM 10 MG PO TABS: 10.0000 mg | ORAL_TABLET | Freq: Every day | ORAL | Status: DC

## 2016-01-25 LAB — HM DIABETES FOOT EXAM

## 2016-01-25 LAB — HM DIABETES EYE EXAM

## 2016-01-27 ENCOUNTER — Ambulatory Visit (INDEPENDENT_AMBULATORY_CARE_PROVIDER_SITE_OTHER): Payer: 59 | Admitting: Psychology

## 2016-01-27 DIAGNOSIS — F411 Generalized anxiety disorder: Secondary | ICD-10-CM | POA: Diagnosis not present

## 2016-01-27 DIAGNOSIS — F330 Major depressive disorder, recurrent, mild: Secondary | ICD-10-CM

## 2016-01-28 ENCOUNTER — Ambulatory Visit: Payer: 59 | Admitting: Nurse Practitioner

## 2016-02-01 ENCOUNTER — Encounter: Payer: Self-pay | Admitting: Nurse Practitioner

## 2016-02-04 ENCOUNTER — Ambulatory Visit: Payer: 59 | Admitting: Psychology

## 2016-02-09 ENCOUNTER — Ambulatory Visit: Payer: 59 | Admitting: Nurse Practitioner

## 2016-02-14 ENCOUNTER — Ambulatory Visit (INDEPENDENT_AMBULATORY_CARE_PROVIDER_SITE_OTHER): Payer: 59 | Admitting: Psychology

## 2016-02-14 DIAGNOSIS — F330 Major depressive disorder, recurrent, mild: Secondary | ICD-10-CM | POA: Diagnosis not present

## 2016-02-14 DIAGNOSIS — F411 Generalized anxiety disorder: Secondary | ICD-10-CM

## 2016-02-17 ENCOUNTER — Encounter: Payer: Self-pay | Admitting: Nurse Practitioner

## 2016-02-17 ENCOUNTER — Ambulatory Visit (INDEPENDENT_AMBULATORY_CARE_PROVIDER_SITE_OTHER): Payer: 59 | Admitting: Nurse Practitioner

## 2016-02-17 VITALS — BP 114/88 | HR 90 | Temp 98.4°F | Resp 16 | Ht 66.0 in | Wt 270.8 lb

## 2016-02-17 DIAGNOSIS — F909 Attention-deficit hyperactivity disorder, unspecified type: Secondary | ICD-10-CM

## 2016-02-17 DIAGNOSIS — F418 Other specified anxiety disorders: Secondary | ICD-10-CM

## 2016-02-17 MED ORDER — DESOGESTREL-ETHINYL ESTRADIOL 0.15-30 MG-MCG PO TABS: 1.0000 | ORAL_TABLET | Freq: Every day | ORAL | Status: DC

## 2016-02-17 NOTE — Progress Notes (Signed)
[name], female    DOB: Jun 12, 1996  Age: 20 y.o. MRN: [id number]  CC: Follow-up   HPI Amy Olson presents for follow up of depression with anxiety and ADHD.   1) Lexapro 20 mg- helpful  Vyvanse- taking as needed and it works when it is taken   Started seeing therapist at [facility]  2 x went and trying to realize stress and anxiety triggers.   [facility]- recent labs   History Amy Olson has a past medical history of allergies; Diabetes mellitus without complication (HCC); Asthma; Depression; and Allergy.   She has past surgical history that includes Adenoidectomy.   Her family history includes Arthritis in her maternal grandmother; Diabetes in her brother; Hyperlipidemia in her maternal grandfather, maternal grandmother, and mother; Hypertension in her maternal grandfather and maternal grandmother; Rheum arthritis in her maternal grandmother.She reports that she has never smoked. She does not have any smokeless tobacco history on file. She reports that she drinks alcohol. She reports that she does not use illicit drugs.  Outpatient Prescriptions Prior to Visit  Medication Sig Dispense Refill  . albuterol (PROAIR HFA) 108 (90 BASE) MCG/ACT inhaler Inhale 2 puffs into the lungs every 6 (six) hours as needed for wheezing or shortness of breath. 1 Inhaler 1  . beclomethasone (QVAR) 80 MCG/ACT inhaler Take 2 puffs first thing in am and then another 2 puffs about 12 hours later. 3 Inhaler 3  . ipratropium (ATROVENT) 0.06 % nasal spray Place 2 sprays into both nostrils 4 (four) times daily. 15 mL 1  . lisdexamfetamine (VYVANSE) 20 MG capsule Take 20 mg by mouth daily.    Marland Kitchen loratadine (CLARITIN) 10 MG tablet Take 10 mg by mouth daily.    . metFORMIN (GLUCOPHAGE) 500 MG tablet Take 500 mg by mouth 2 (two) times daily with a meal.    . montelukast (SINGULAIR) 10 MG tablet Take 1 tablet (10 mg total) by mouth at bedtime. 90 tablet 3  . desogestrel-ethinyl estradiol  (APRI) 0.15-30 MG-MCG tablet Take 1 tablet by mouth daily. 3 Package 3  . escitalopram (LEXAPRO) 10 MG tablet Take 20 mg by mouth daily.      No facility-administered medications prior to visit.    ROS Review of Systems  Constitutional: Negative for fever, chills, diaphoresis, activity change, appetite change, fatigue and unexpected weight change.  Eyes: Negative for visual disturbance.  Respiratory: Negative for chest tightness and shortness of breath.   Cardiovascular: Negative for chest pain.  Gastrointestinal: Negative for nausea, vomiting and diarrhea.  Neurological: Negative for headaches.  Psychiatric/Behavioral: Positive for decreased concentration. Negative for suicidal ideas and sleep disturbance. The patient is nervous/anxious.     Objective:  BP 114/88 mmHg  Pulse 90  Temp(Src) 98.4 F (36.9 C) (Oral)  Resp 16  Ht  (1.676 m)  Wt 270 lb 12.8 oz (122.834 kg)  BMI 43.73 kg/m2  SpO2 97%  LMP 11/01/2015  Physical Exam  Constitutional: She is oriented to person, place, and time. She appears well-developed and well-nourished. No distress.  HENT:  Head: Normocephalic and atraumatic.  Right Ear: External ear normal.  Left Ear: External ear normal.  Cardiovascular: Normal rate, regular rhythm and normal heart sounds.   Pulmonary/Chest: Effort normal and breath sounds normal. No respiratory distress. She has no wheezes. She has no rales. She exhibits no tenderness.  Neurological: She is alert and oriented to person, place, and time. No cranial nerve deficit. She exhibits normal muscle tone. Coordination normal.  Skin: Skin is warm and dry. No rash noted. She is not diaphoretic.  Psychiatric: She has a normal mood and affect. Her behavior is normal. Judgment and thought content normal.   Assessment & Plan:   Amy Olson was seen today for follow-up.  Diagnoses and all orders for this visit:  Attention deficit hyperactivity disorder (ADHD), unspecified ADHD type -      Ambulatory referral to Psychiatry  Depression with anxiety  Other orders -     desogestrel-ethinyl estradiol (APRI) 0.15-30 MG-MCG tablet; Take 1 tablet by mouth daily.   I am having Amy Olson maintain her metFORMIN, ipratropium, lisdexamfetamine, loratadine, albuterol, beclomethasone, montelukast, escitalopram, and desogestrel-ethinyl estradiol.  Meds ordered this encounter  Medications  . escitalopram (LEXAPRO) 20 MG tablet    Sig:   . desogestrel-ethinyl estradiol (APRI) 0.15-30 MG-MCG tablet    Sig: Take 1 tablet by mouth daily.    Dispense:  3 Package    Refill:  11    Order Specific Question:  Supervising Provider    Answer:  Sherlene ShamsULLO, TERESA L [[id number]]     Follow-up: Return if symptoms worsen or fail to improve.

## 2016-02-17 NOTE — Patient Instructions (Signed)
We will call you about your referral to [facility].

## 2016-02-20 NOTE — Assessment & Plan Note (Signed)
Referral to psychiatry to further prescribe her ADHD medication

## 2016-02-20 NOTE — Assessment & Plan Note (Addendum)
Seeing therapist to help with this as well Stable on lexapro 20 mg currently

## 2016-03-01 ENCOUNTER — Ambulatory Visit: Payer: 59 | Admitting: Psychology

## 2016-07-04 ENCOUNTER — Ambulatory Visit (INDEPENDENT_AMBULATORY_CARE_PROVIDER_SITE_OTHER): Payer: 59 | Admitting: Family Medicine

## 2016-07-04 VITALS — BP 129/86 | HR 86 | Temp 98.6°F | Wt 275.0 lb

## 2016-07-04 DIAGNOSIS — J4530 Mild persistent asthma, uncomplicated: Secondary | ICD-10-CM | POA: Diagnosis not present

## 2016-07-04 DIAGNOSIS — Z6841 Body Mass Index (BMI) 40.0 and over, adult: Secondary | ICD-10-CM | POA: Diagnosis not present

## 2016-07-04 DIAGNOSIS — E119 Type 2 diabetes mellitus without complications: Secondary | ICD-10-CM | POA: Insufficient documentation

## 2016-07-04 DIAGNOSIS — Z130 Encounter for screening for diseases of the blood and blood-forming organs and certain disorders involving the immune mechanism: Secondary | ICD-10-CM

## 2016-07-04 DIAGNOSIS — R7303 Prediabetes: Secondary | ICD-10-CM | POA: Diagnosis not present

## 2016-07-04 DIAGNOSIS — R7989 Other specified abnormal findings of blood chemistry: Secondary | ICD-10-CM | POA: Diagnosis not present

## 2016-07-04 DIAGNOSIS — R040 Epistaxis: Secondary | ICD-10-CM

## 2016-07-04 DIAGNOSIS — F418 Other specified anxiety disorders: Secondary | ICD-10-CM

## 2016-07-04 LAB — CBC
HEMATOCRIT: 40.8 % (ref 36.0–46.0)
Hemoglobin: 13.8 g/dL (ref 12.0–15.0)
MCHC: 33.8 g/dL (ref 30.0–36.0)
MCV: 87.1 fl (ref 78.0–100.0)
Platelets: 298 10*3/uL (ref 150.0–400.0)
RBC: 4.68 Mil/uL (ref 3.87–5.11)
RDW: 14.7 % (ref 11.5–15.5)
WBC: 8.5 10*3/uL (ref 4.0–10.5)

## 2016-07-04 LAB — COMPREHENSIVE METABOLIC PANEL
ALK PHOS: 48 U/L (ref 39–117)
ALT: 19 U/L (ref 0–35)
AST: 20 U/L (ref 0–37)
Albumin: 3.7 g/dL (ref 3.5–5.2)
BUN: 10 mg/dL (ref 6–23)
CO2: 26 meq/L (ref 19–32)
Calcium: 9 mg/dL (ref 8.4–10.5)
Chloride: 105 mEq/L (ref 96–112)
Creatinine, Ser: 0.77 mg/dL (ref 0.40–1.20)
GFR: 99.63 mL/min (ref >60.00)
GLUCOSE: 116 mg/dL — AB (ref 70–99)
POTASSIUM: 4.3 meq/L (ref 3.5–5.1)
Sodium: 136 mEq/L (ref 135–145)
Total Bilirubin: 0.4 mg/dL (ref 0.2–1.2)
Total Protein: 7.3 g/dL (ref 6.0–8.3)

## 2016-07-04 LAB — LIPID PANEL
CHOL/HDL RATIO: 3
Cholesterol: 135 mg/dL (ref 0–200)
HDL: 51.8 mg/dL (ref >39.00)
NONHDL: 83.43
TRIGLYCERIDES: 210 mg/dL — AB (ref 0.0–149.0)
VLDL: 42 mg/dL — AB (ref 0.0–40.0)

## 2016-07-04 LAB — LDL CHOLESTEROL, DIRECT: Direct LDL: 55 mg/dL

## 2016-07-04 LAB — HEMOGLOBIN A1C: Hgb A1c MFr Bld: 6.5 % (ref 4.6–6.5)

## 2016-07-04 MED ORDER — DESOGESTREL-ETHINYL ESTRADIOL 0.15-30 MG-MCG PO TABS: 1.0000 | ORAL_TABLET | Freq: Every day | ORAL | 4 refills | Status: DC

## 2016-07-04 MED ORDER — MONTELUKAST SODIUM 10 MG PO TABS: 10.0000 mg | ORAL_TABLET | Freq: Every day | ORAL | 3 refills | Status: DC

## 2016-07-04 MED ORDER — ESCITALOPRAM OXALATE 20 MG PO TABS: 20.0000 mg | ORAL_TABLET | Freq: Every day | ORAL | 3 refills | Status: DC

## 2016-07-04 NOTE — Patient Instructions (Signed)
We will call with your lab results.  Use the saline 1-2 times daily.  Follow up in 1 year or sooner if needed.  Take care  Dr. Adriana Simasook

## 2016-07-04 NOTE — Assessment & Plan Note (Signed)
Stable. Refilled Lexapro.

## 2016-07-04 NOTE — Assessment & Plan Note (Signed)
Unclear of control. A1C today. Will wait on labs to fill metformin.

## 2016-07-04 NOTE — Assessment & Plan Note (Signed)
New problem. No current bleeding. Advised use of OTC nasal saline.

## 2016-07-04 NOTE — Assessment & Plan Note (Signed)
Stable. Continue QVAR and PRN Albuterol.

## 2016-07-04 NOTE — Progress Notes (Signed)
Subjective:  [name], female    DOB: [birth year]  Age: 20 y.o. MRN: [id number]  CC: Follow up  HPI:  20 year old female with asthma, morbid obesity, depression/anxiety, prediabetes presents for follow-up.  Prediabetes  Currently on metformin.  No A1c is available in the chart.  Needs labs. Requesting refill on metformin.  Asthma  Stable on QVAR and Albuterol.  Depression/anxiety  Stable on Lexapro.  Needs refill.  Bloody nose  Patient reports that for the past 2 weeks she's had periods of time where she was blowing blood out of her left nostril.  No associated sinus congestion or pain.  No purulent nasal discharge.  No known exacerbating or relieving factors.  No medications or interventions tried.  No other complaints.  Social Hx   Social History   Social History  . Marital status: Single    Spouse name: N/A  . Number of children: N/A  . Years of education: N/A   Occupational History  . school    Social History Main Topics  . Smoking status: Never Smoker  . Smokeless tobacco: Not on file  . Alcohol use 0.0 oz/week     Comment: rare  . Drug use: No  . Sexual activity: Not Currently   Other Topics Concern  . Not on file   Social History Narrative   Single   College- Student [employer]    Caffeine- no soda, 1 cup tea, rare coffee       Review of Systems  Constitutional: Negative.   HENT:       Blood when blowing nose.  Respiratory: Positive for cough.    Objective:  BP 129/86 (BP Location: Right Arm)   Pulse 86   Temp 98.6 F (37 C) (Oral)   Wt 275 lb (124.7 kg)   SpO2 99%   BMI 44.39 kg/m   BP/Weight 07/04/2016 0/30/0923 3/0/0762  Systolic BP 263 335 456  Diastolic BP 86 88 84  Wt. (Lbs) 275 270.8 272  BMI 44.39 43.73 43.92   Physical Exam  Constitutional: She is oriented to person, place, and time.  Morbidly obese female in no acute distress.  HENT:  Head: Normocephalic and atraumatic.  Nose: Nose normal.    Mouth/Throat: Oropharynx is clear and moist.  Cardiovascular: Normal rate and regular rhythm.   Pulmonary/Chest: Effort normal. She has no wheezes. She has no rales.  Neurological: She is alert and oriented to person, place, and time.  Psychiatric: She has a normal mood and affect.  Vitals reviewed.  Lab Results  Component Value Date   WBC 6.0 09/15/2013   HGB 12.1 09/15/2013   HCT 36.6 09/15/2013   PLT 284.0 09/15/2013   GLUCOSE 120 (H) 09/16/2013   NA 136 09/16/2013   K 4.2 09/16/2013   CL 106 09/16/2013   CREATININE 1.0 09/16/2013   BUN 10 09/16/2013   CO2 23 09/16/2013   Assessment & Plan:   Problem List Items Addressed This Visit    Extrinsic asthma    Stable. Continue QVAR and PRN Albuterol.       Relevant Medications   montelukast (SINGULAIR) 10 MG tablet   Morbid obesity with BMI of 40.0-44.9, adult (HCC)   Relevant Orders   Lipid Profile   Depression with anxiety    Stable. Refilled Lexapro.      Bleeding from the nose    New problem. No current bleeding. Advised use of OTC nasal saline.      Prediabetes - Primary  Unclear of control. A1C today. Will wait on labs to fill metformin.      Relevant Orders   Comp Met (CMET)   HgB A1c    Other Visit Diagnoses    Screening for deficiency anemia       Relevant Orders   CBC     Meds ordered this encounter  Medications  . escitalopram (LEXAPRO) 20 MG tablet    Sig: Take 1 tablet (20 mg total) by mouth daily.    Dispense:  90 tablet    Refill:  3  . montelukast (SINGULAIR) 10 MG tablet    Sig: Take 1 tablet (10 mg total) by mouth at bedtime.    Dispense:  90 tablet    Refill:  3  . desogestrel-ethinyl estradiol (APRI) 0.15-30 MG-MCG tablet    Sig: Take 1 tablet by mouth daily.    Dispense:  3 Package    Refill:  4   Follow-up: Annually  [place]

## 2016-07-10 ENCOUNTER — Encounter: Payer: Self-pay | Admitting: Family Medicine

## 2016-07-11 ENCOUNTER — Other Ambulatory Visit: Payer: Self-pay | Admitting: Family Medicine

## 2016-07-11 MED ORDER — METFORMIN HCL 500 MG PO TABS: ORAL_TABLET | ORAL | 1 refills | Status: DC

## 2016-08-11 ENCOUNTER — Telehealth: Payer: Self-pay | Admitting: Family Medicine

## 2016-08-11 NOTE — Telephone Encounter (Signed)
Pt mom called and was requesting to not be charged for a no show fee for DOS 4/19. Daughter had something come up with and was unable to make the appointment and rescheduled for another day. Daughter was unaware of the no show fee, mom would like to not pay this.  Call [phone]

## 2016-09-18 NOTE — Telephone Encounter (Signed)
Information sent to charge correction

## 2016-10-27 ENCOUNTER — Encounter: Payer: Self-pay | Admitting: Family Medicine

## 2016-10-27 ENCOUNTER — Ambulatory Visit (INDEPENDENT_AMBULATORY_CARE_PROVIDER_SITE_OTHER): Payer: 59 | Admitting: Family Medicine

## 2016-10-27 VITALS — BP 110/80 | HR 104 | Temp 98.8°F | Wt 265.0 lb

## 2016-10-27 DIAGNOSIS — J101 Influenza due to other identified influenza virus with other respiratory manifestations: Secondary | ICD-10-CM | POA: Diagnosis not present

## 2016-10-27 LAB — POCT INFLUENZA A/B
INFLUENZA B, POC: NEGATIVE
Influenza A, POC: POSITIVE — AB

## 2016-10-27 MED ORDER — OSELTAMIVIR PHOSPHATE 75 MG PO CAPS: 75.0000 mg | ORAL_CAPSULE | Freq: Two times a day (BID) | ORAL | 0 refills | Status: DC

## 2016-10-27 NOTE — Progress Notes (Signed)
Pre visit review using our clinic review tool, if applicable. No additional management support is needed unless otherwise documented below in the visit note. 

## 2016-10-27 NOTE — Progress Notes (Signed)
   Subjective:    Patient ID: Marvis RepressJenny E Magana, female    DOB: 1996-07-31, 21 y.o.   MRN: [id number]  HPI This is a 21 yo female who presents today with 2 days of cough, body aches, runny nose, headache. Fever to 100.3. Taking acetaminophen with some relief of fever. Some SOB and wheeze, cough occasionally productive of sputum. Has not tried albuterol inhaler. Family member with positive flu recently.   Past Medical History:  Diagnosis Date  . allergies   . Allergy   . Asthma   . Depression   . Diabetes mellitus without complication [facility](HCC)    Past Surgical History:  Procedure Laterality Date  . ADENOIDECTOMY     1998- only adenoids   Family History  Problem Relation Age of Onset  . Rheum arthritis Maternal Grandmother   . Hyperlipidemia Maternal Grandmother   . Hypertension Maternal Grandmother   . Arthritis Maternal Grandmother   . Hyperlipidemia Mother   . Diabetes Brother   . Hyperlipidemia Maternal Grandfather   . Hypertension Maternal Grandfather    Social History  Substance Use Topics  . Smoking status: Never Smoker  . Smokeless tobacco: Not on file  . Alcohol use 0.0 oz/week     Comment: rare      Review of Systems Per HPI    Objective:   Physical Exam  Constitutional: She is oriented to person, place, and time. She appears well-developed and well-nourished. She appears ill. No distress.  HENT:  Head: Normocephalic and atraumatic.  Right Ear: External ear normal.  Left Ear: External ear normal.  Nose: Nose normal.  Mouth/Throat: Oropharynx is clear and moist. No oropharyngeal exudate.  Eyes: Pupils are equal, round, and reactive to light. Right eye exhibits discharge (clear).  Neck: Normal range of motion. Neck supple.  Cardiovascular: Normal rate, regular rhythm and normal heart sounds.   Pulmonary/Chest: Effort normal and breath sounds normal.  Neurological: She is alert and oriented to person, place, and time.  Skin: Skin is warm and dry. She is not  diaphoretic.  Psychiatric: She has a normal mood and affect. Her behavior is normal. Judgment and thought content normal.  Vitals reviewed.     BP 110/80 (BP Location: Left Arm, Patient Position: Sitting, Cuff Size: Large)   Pulse (!) 104   Temp 98.8 F (37.1 C) (Oral)   Wt 265 lb (120.2 kg)   SpO2 97%   BMI 42.77 kg/m  Wt Readings from Last 3 Encounters:  10/27/16 265 lb (120.2 kg)  07/04/16 275 lb (124.7 kg)  02/17/16 270 lb 12.8 oz (122.8 kg)   Results for orders placed or performed in visit on 10/27/16  POCT Influenza A/B  Result Value Ref Range   Influenza A, POC Positive (A) Negative   Influenza B, POC Negative Negative       Assessment & Plan:  1. Influenza A - Provided written and verbal information regarding diagnosis and treatment. Discussed risks and benefits of Tamiflu and patient wishes to take. - RTC precautions reviewed - fluids, rest, acetaminophen, can use albuterol inhaler for cough/wheeze - POCT Influenza A/B - oseltamivir (TAMIFLU) 75 MG capsule; Take 1 capsule (75 mg total) by mouth 2 (two) times daily.  Dispense: 10 capsule; Refill: 0   Olean Reeeborah Isabel Ardila, FNP-BC  [facility]  10/27/2016 3:35 PM

## 2016-10-27 NOTE — Patient Instructions (Signed)

## 2016-11-06 DIAGNOSIS — J040 Acute laryngitis: Secondary | ICD-10-CM | POA: Diagnosis not present

## 2016-11-11 ENCOUNTER — Other Ambulatory Visit: Payer: Self-pay | Admitting: Internal Medicine

## 2016-12-14 ENCOUNTER — Ambulatory Visit (INDEPENDENT_AMBULATORY_CARE_PROVIDER_SITE_OTHER): Payer: 59

## 2016-12-14 DIAGNOSIS — Z23 Encounter for immunization: Secondary | ICD-10-CM

## 2017-02-27 DIAGNOSIS — B349 Viral infection, unspecified: Secondary | ICD-10-CM | POA: Diagnosis not present

## 2017-02-27 DIAGNOSIS — R05 Cough: Secondary | ICD-10-CM | POA: Diagnosis not present

## 2017-02-28 ENCOUNTER — Ambulatory Visit: Payer: Self-pay | Admitting: Family Medicine

## 2017-02-28 ENCOUNTER — Telehealth: Payer: Self-pay | Admitting: Family Medicine

## 2017-02-28 NOTE — Telephone Encounter (Signed)
FYI - Pt mom called and stated that pt went to Urgent care last night.

## 2017-02-28 NOTE — Telephone Encounter (Signed)
Provider aware

## 2017-04-17 DIAGNOSIS — H10503 Unspecified blepharoconjunctivitis, bilateral: Secondary | ICD-10-CM | POA: Diagnosis not present

## 2017-05-07 ENCOUNTER — Encounter: Payer: Self-pay | Admitting: Emergency Medicine

## 2017-05-07 ENCOUNTER — Emergency Department: Payer: Worker's Compensation

## 2017-05-07 ENCOUNTER — Emergency Department
Admission: EM | Admit: 2017-05-07 | Discharge: 2017-05-07 | Disposition: A | Discharge status: Home / Self Care | Payer: Worker's Compensation | Attending: Emergency Medicine | Admitting: Emergency Medicine

## 2017-05-07 DIAGNOSIS — J45909 Unspecified asthma, uncomplicated: Secondary | ICD-10-CM | POA: Insufficient documentation

## 2017-05-07 DIAGNOSIS — R1084 Generalized abdominal pain: Secondary | ICD-10-CM | POA: Diagnosis present

## 2017-05-07 DIAGNOSIS — E119 Type 2 diabetes mellitus without complications: Secondary | ICD-10-CM | POA: Insufficient documentation

## 2017-05-07 DIAGNOSIS — M25562 Pain in left knee: Secondary | ICD-10-CM | POA: Diagnosis not present

## 2017-05-07 DIAGNOSIS — Z7984 Long term (current) use of oral hypoglycemic drugs: Secondary | ICD-10-CM | POA: Insufficient documentation

## 2017-05-07 DIAGNOSIS — Z79899 Other long term (current) drug therapy: Secondary | ICD-10-CM | POA: Insufficient documentation

## 2017-05-07 DIAGNOSIS — W19XXXA Unspecified fall, initial encounter: Secondary | ICD-10-CM

## 2017-05-07 LAB — POCT PREGNANCY, URINE: PREG TEST UR: NEGATIVE

## 2017-05-07 MED ORDER — TRAMADOL HCL 50 MG PO TABS: 50.0000 mg | ORAL_TABLET | Freq: Two times a day (BID) | ORAL | 0 refills | Status: AC | PRN

## 2017-05-07 NOTE — ED Provider Notes (Signed)
[facility] Emergency Department Provider Note  ____________________________________________  Time seen: Approximately 3:41 PM  I have reviewed the triage vital signs and the nursing notes.   HISTORY  Chief Complaint Fall    HPI Amy Olson is a 21 y.o. female presenting to the emergency department with 8 out of 10 left knee pain and right upper quadrant and left upper quadrant abdominal discomfort. Patient states that she was walking, tripped and fell against a cash register that she was carrying. Fall occurred yesterday. Patient denies associated radiculopathy, weakness or changes in sensation of the lower extremities. Patient has had some nausea but no vomiting. She denies chest tightness, chest pain and shortness of breath. She did not hit her head. Patient has been ambulating at baseline.   Past Medical History:  Diagnosis Date  . allergies   . Allergy   . Asthma   . Depression   . Diabetes mellitus without complication [facility])    prediabetic    Patient Active Problem List   Diagnosis Date Noted  . Bleeding from the nose 07/04/2016  . Prediabetes 07/04/2016  . Attention deficit hyperactivity disorder (ADHD) 12/04/2015  . Depression with anxiety 12/04/2015  . Morbid obesity with BMI of 40.0-44.9, adult (HCC) 10/13/2015  . Extrinsic asthma 09/15/2013    Past Surgical History:  Procedure Laterality Date  . ADENOIDECTOMY     1998- only adenoids    Prior to Admission medications   Medication Sig Start Date End Date Taking? Authorizing Provider  albuterol (PROAIR HFA) 108 (90 BASE) MCG/ACT inhaler Inhale 2 puffs into the lungs every 6 (six) hours as needed for wheezing or shortness of breath. 09/01/15   Nyoka Cowden, MD  desogestrel-ethinyl estradiol (APRI) 0.15-30 MG-MCG tablet Take 1 tablet by mouth daily. 07/04/16   Tommie Sams, DO  escitalopram (LEXAPRO) 20 MG tablet Take 1 tablet (20 mg total) by mouth daily. 07/04/16   Everlene Other G, DO   ipratropium (ATROVENT) 0.06 % nasal spray Place 2 sprays into both nostrils 4 (four) times daily. 12/14/13   Rodolph Bong, MD  loratadine (CLARITIN) 10 MG tablet Take 10 mg by mouth daily.    [provider]  metFORMIN (GLUCOPHAGE) 500 MG tablet Take 2 tablets (1,000 mg) in the morning with a meal and 2 tablets (1,000 mg) at night with a meal. 07/11/16   Cook, Jayce G, DO  montelukast (SINGULAIR) 10 MG tablet Take 1 tablet (10 mg total) by mouth at bedtime. 07/04/16   Tommie Sams, DO  oseltamivir (TAMIFLU) 75 MG capsule Take 1 capsule (75 mg total) by mouth 2 (two) times daily. 10/27/16   Emi Belfast, FNP  QVAR 80 MCG/ACT inhaler TAKE 2 PUFFS FIRST THING IN THE MORNING AND THEN  ANOTHER 2 PUFFS ABOUT 12  HOURS LATER. 11/13/16   Nyoka Cowden, MD  traMADol (ULTRAM) 50 MG tablet Take 1 tablet (50 mg total) by mouth every 12 (twelve) hours as needed. 05/07/17 05/12/17  Orvil Feil, PA-C    Allergies Augmentin [amoxicillin-pot clavulanate]  Family History  Problem Relation Age of Onset  . Rheum arthritis Maternal Grandmother   . Hyperlipidemia Maternal Grandmother   . Hypertension Maternal Grandmother   . Arthritis Maternal Grandmother   . Hyperlipidemia Mother   . Diabetes Brother   . Hyperlipidemia Maternal Grandfather   . Hypertension Maternal Grandfather     Social History Social History  Substance Use Topics  . Smoking status: Never Smoker  .  Smokeless tobacco: Not on file  . Alcohol use 0.0 oz/week     Comment: rare     Review of Systems  Constitutional: No fever/chills Eyes: No visual changes. No discharge ENT: No upper respiratory complaints. Cardiovascular: no chest pain. Respiratory: no cough. No SOB. Gastrointestinal: RUQ and LUQ abdominal discomfort. Musculoskeletal: Patient has left knee pain.  Skin: Negative for rash, abrasions, lacerations, ecchymosis. Neurological: Negative for headaches, focal weakness or  numbness.   ____________________________________________   PHYSICAL EXAM:  VITAL SIGNS: ED Triage Vitals  Enc Vitals Group     BP 05/07/17 1444 123/87     Pulse Rate 05/07/17 1444 82     Resp 05/07/17 1444 18     Temp 05/07/17 1444 98.3 F (36.8 C)     Temp Source 05/07/17 1444 Oral     SpO2 05/07/17 1444 100 %     Weight 05/07/17 1445 270 lb (122.5 kg)     Height 05/07/17 1445 5\' 6"  (1.676 m)     Head Circumference --      Peak Flow --      Pain Score 05/07/17 1443 8     Pain Loc --      Pain Edu? --      Excl. in GC? --      Constitutional: Alert and oriented. Patient is talkative and engaged.  Eyes: Palpebral and bulbar conjunctiva are nonerythematous bilaterally. PERRL. EOMI.  Head: Atraumatic. ENT:      Ears: Tympanic membranes are pearly bilaterally without bloody effusion visualized.       Nose: Nasal septum is midline without evidence of blood or septal hematoma.      Mouth/Throat: Mucous membranes are moist. Uvula is midline. Neck: Full range of motion. No pain with neck flexion. No pain with palpation of the cervical spine.  Cardiovascular: No pain with palpation over the anterior and posterior chest wall. Normal rate, regular rhythm. Normal S1 and S2. No murmurs, gallops or rubs auscultated.  Respiratory: Trachea is midline. Resonant and symmetric percussion tones bilaterally. On auscultation, adventitious sounds are absent.  Gastrointestinal:Abdomen is symmetric. Bowel sounds positive in all 4 quadrants. Musculature soft and relaxed to light palpation. Patient has some mild tenderness along the left and right upper abdominal quadrants. No costovertebral angle tenderness bilaterally.  Musculoskeletal: Patient has 5/5 strength in the upper and lower extremities bilaterally. Full range of motion at the shoulder, elbow and wrist bilaterally. Full range of motion at the hip, knee and ankle bilaterally. Left knee: Negative anterior and posterior drawer sign. No laxity  with MCL or LCL testing. Negative ballottement. Patient has pain with apprehension. No changes in gait. Palpable radial, ulnar and dorsalis pedis pulses bilaterally and symmetrically. Neurologic: Normal speech and language. No gross focal neurologic deficits are appreciated. Cranial nerves: 2-10 normal as tested. Cerebellar: Finger-nose-finger WNL, heel to shin WNL. Sensorimotor: No sensory loss or abnormal reflexes. Vision: No visual field deficts noted to confrontation.  Speech: No dysarthria or expressive aphasia.  Skin:  Skin is warm, dry and intact. No rash or bruising noted.  Psychiatric: Mood and affect are normal for age. Speech and behavior are normal.    ____________________________________________   LABS (all labs ordered are listed, but only abnormal results are displayed)  Labs Reviewed  POC URINE PREG, ED  POCT PREGNANCY, URINE   ____________________________________________  EKG   ____________________________________________  RADIOLOGY Geraldo Pitter, personally viewed and evaluated these images (plain radiographs) as part of my medical decision making, as well  as reviewing the written report by the radiologist.   Dg Abdomen 1 View  Result Date: 05/07/2017 CLINICAL DATA:  Fall EXAM: ABDOMEN - 1 VIEW COMPARISON:  None. FINDINGS: The bowel gas pattern is normal. No radio-opaque calculi or other significant radiographic abnormality are seen. IMPRESSION: Normal abdominal radiograph. Electronically Signed   By: Deatra RobinsonKevin  Herman M.D.   On: 05/07/2017 16:23   Dg Knee Complete 4 Views Left  Result Date: 05/07/2017 CLINICAL DATA:  Fall last night at work. Left knee pain. Initial encounter. EXAM: LEFT KNEE - COMPLETE 4+ VIEW COMPARISON:  None. FINDINGS: No evidence of fracture, dislocation, or joint effusion. No evidence of arthropathy or other focal bone abnormality. Soft tissues are unremarkable. IMPRESSION: Negative. Electronically Signed   By: Marnee SpringJonathon  Watts M.D.   On:  05/07/2017 16:23    ____________________________________________    PROCEDURES  Procedure(s) performed:    Procedures    Medications - No data to display   ____________________________________________   INITIAL IMPRESSION / ASSESSMENT AND PLAN / ED COURSE  Pertinent labs & imaging results that were available during my care of the patient were reviewed by me and considered in my medical decision making (see chart for details).  Review of the [place] CSRS was performed in accordance of the [facility] prior to dispensing any controlled drugs.     Assessment and plan: Fall, initial encounter Patient presents to the emergency department with left knee pain and right upper quadrant/left upper quadrant abdominal discomfort. On physical exam, abdomen was soft without guarding or rigidity. KUB revealed no free air. Given reassuring physical exam, CT abdomen is not warranted at this time. DG left knee revealed no acute fractures or bony abnormalities. Patient was discharged with tramadol for pain. Patient was advised to follow-up with Dr. Martha ClanKrasinski as needed. All patient questions were answered.  ____________________________________________  FINAL CLINICAL IMPRESSION(S) / ED DIAGNOSES  Final diagnoses:  Fall, initial encounter      NEW MEDICATIONS STARTED DURING THIS VISIT:  New Prescriptions   TRAMADOL (ULTRAM) 50 MG TABLET    Take 1 tablet (50 mg total) by mouth every 12 (twelve) hours as needed.        This chart was dictated using voice recognition software/Dragon. Despite best efforts to proofread, errors can occur which can change the meaning. Any change was purely unintentional.    Gasper LloydWoods, Jaclyn M, PA-C 05/07/17 1710    Sharman CheekStafford, Phillip, MD 05/08/17 2324

## 2017-05-07 NOTE — ED Triage Notes (Signed)
States tripped and fell last night at work. Had cash tray in hand and landed in it. Pain upper abdomen and L knee. No LOC. Denies use of blood thinners.

## 2017-06-28 ENCOUNTER — Other Ambulatory Visit: Payer: Self-pay | Admitting: Family Medicine

## 2017-07-05 ENCOUNTER — Encounter: Payer: Self-pay | Admitting: Emergency Medicine

## 2017-07-05 ENCOUNTER — Emergency Department
Admission: EM | Admit: 2017-07-05 | Discharge: 2017-07-05 | Disposition: A | Discharge status: Home / Self Care | Payer: Commercial Managed Care - HMO | Attending: Emergency Medicine | Admitting: Emergency Medicine

## 2017-07-05 ENCOUNTER — Ambulatory Visit (INDEPENDENT_AMBULATORY_CARE_PROVIDER_SITE_OTHER): Payer: 59 | Admitting: Family Medicine

## 2017-07-05 ENCOUNTER — Emergency Department: Payer: Commercial Managed Care - HMO

## 2017-07-05 VITALS — BP 110/90 | HR 91 | Temp 98.5°F | Resp 16 | Ht 66.0 in | Wt 270.0 lb

## 2017-07-05 DIAGNOSIS — Z79899 Other long term (current) drug therapy: Secondary | ICD-10-CM | POA: Diagnosis not present

## 2017-07-05 DIAGNOSIS — Z0000 Encounter for general adult medical examination without abnormal findings: Secondary | ICD-10-CM

## 2017-07-05 DIAGNOSIS — Z7984 Long term (current) use of oral hypoglycemic drugs: Secondary | ICD-10-CM | POA: Diagnosis not present

## 2017-07-05 DIAGNOSIS — J45909 Unspecified asthma, uncomplicated: Secondary | ICD-10-CM | POA: Insufficient documentation

## 2017-07-05 DIAGNOSIS — R1011 Right upper quadrant pain: Secondary | ICD-10-CM | POA: Diagnosis not present

## 2017-07-05 DIAGNOSIS — E119 Type 2 diabetes mellitus without complications: Secondary | ICD-10-CM | POA: Diagnosis not present

## 2017-07-05 DIAGNOSIS — R1013 Epigastric pain: Secondary | ICD-10-CM | POA: Diagnosis not present

## 2017-07-05 DIAGNOSIS — R109 Unspecified abdominal pain: Secondary | ICD-10-CM | POA: Diagnosis not present

## 2017-07-05 LAB — URINALYSIS, COMPLETE (UACMP) WITH MICROSCOPIC
BILIRUBIN URINE: NEGATIVE
Glucose, UA: NEGATIVE mg/dL
Hgb urine dipstick: NEGATIVE
Nitrite: NEGATIVE
Protein, ur: NEGATIVE mg/dL
RBC / HPF: NONE SEEN RBC/hpf (ref 0–5)
Specific Gravity, Urine: >1.03 — ABNORMAL HIGH (ref 1.005–1.030)
pH: 5 (ref 5.0–8.0)

## 2017-07-05 LAB — COMPREHENSIVE METABOLIC PANEL
ALBUMIN: 3.3 g/dL — AB (ref 3.5–5.0)
ALT: 15 U/L (ref 14–54)
ANION GAP: 10 (ref 5–15)
AST: 22 U/L (ref 15–41)
Alkaline Phosphatase: 58 U/L (ref 38–126)
BUN: 10 mg/dL (ref 6–20)
CHLORIDE: 106 mmol/L (ref 101–111)
CO2: 22 mmol/L (ref 22–32)
Calcium: 8.6 mg/dL — ABNORMAL LOW (ref 8.9–10.3)
Creatinine, Ser: 0.81 mg/dL (ref 0.44–1.00)
GFR calc Af Amer: >60 mL/min (ref >60)
GFR calc non Af Amer: >60 mL/min (ref >60)
GLUCOSE: 133 mg/dL — AB (ref 65–99)
POTASSIUM: 3.9 mmol/L (ref 3.5–5.1)
Sodium: 138 mmol/L (ref 135–145)
Total Bilirubin: 0.3 mg/dL (ref 0.3–1.2)
Total Protein: 7.2 g/dL (ref 6.5–8.1)

## 2017-07-05 LAB — CBC
HEMATOCRIT: 39 % (ref 35.0–47.0)
HEMOGLOBIN: 13.3 g/dL (ref 12.0–16.0)
MCH: 29 pg (ref 26.0–34.0)
MCHC: 34 g/dL (ref 32.0–36.0)
MCV: 85.3 fL (ref 80.0–100.0)
Platelets: 305 10*3/uL (ref 150–440)
RBC: 4.58 MIL/uL (ref 3.80–5.20)
RDW: 15 % — ABNORMAL HIGH (ref 11.5–14.5)
WBC: 9.5 10*3/uL (ref 3.6–11.0)

## 2017-07-05 LAB — LIPASE, BLOOD: LIPASE: 20 U/L (ref 11–51)

## 2017-07-05 LAB — POCT PREGNANCY, URINE: Preg Test, Ur: NEGATIVE

## 2017-07-05 MED ORDER — FOSFOMYCIN TROMETHAMINE 3 G PO PACK
3.0000 g | PACK | Freq: Once | ORAL | Status: AC
  Administered 2017-07-05: 3 g via ORAL
  Filled 2017-07-05: qty 3

## 2017-07-05 MED ORDER — IOPAMIDOL (ISOVUE-300) INJECTION 61%
30.0000 mL | Freq: Once | INTRAVENOUS | Status: AC | PRN
  Administered 2017-07-05: 30 mL via ORAL

## 2017-07-05 MED ORDER — SODIUM CHLORIDE 0.9 % IV BOLUS (SEPSIS)
1000.0000 mL | Freq: Once | INTRAVENOUS | Status: AC
  Administered 2017-07-05: 1000 mL via INTRAVENOUS

## 2017-07-05 MED ORDER — ONDANSETRON HCL 4 MG/2ML IJ SOLN
4.0000 mg | Freq: Once | INTRAMUSCULAR | Status: AC
  Administered 2017-07-05: 4 mg via INTRAVENOUS
  Filled 2017-07-05: qty 2

## 2017-07-05 MED ORDER — IOPAMIDOL (ISOVUE-300) INJECTION 61%
100.0000 mL | Freq: Once | INTRAVENOUS | Status: AC | PRN
  Administered 2017-07-05: 100 mL via INTRAVENOUS

## 2017-07-05 MED ORDER — MORPHINE SULFATE (PF) 2 MG/ML IV SOLN
2.0000 mg | Freq: Once | INTRAVENOUS | Status: AC
  Administered 2017-07-05: 2 mg via INTRAVENOUS
  Filled 2017-07-05: qty 1

## 2017-07-05 NOTE — ED Provider Notes (Signed)
[facility] Emergency Department Provider Note    First MD Initiated Contact with Patient 07/05/17 [phone]     (approximate)  I have reviewed the triage vital signs and the nursing notes.   HISTORY  Chief Complaint Abdominal Pain    HPI Amy Olson is a 21 y.o. female presents with acute onset of epigastric/upper abdominal pain last night at approximately 7:00 which is accompanied by nausea and one episode of diarrhea. Patient states that her pain increases when laying down. Patient states last food ingestion was lunchtime yesterday. Patient states that she attempted to eat at about 10:00 last night however this worsened her pain. Patient denies any fever no urinary symptoms.   Past Medical History:  Diagnosis Date  . allergies   . Allergy   . Asthma   . Depression   . Diabetes mellitus without complication [facility](HCC)    prediabetic    Patient Active Problem List   Diagnosis Date Noted  . Bleeding from the nose 07/04/2016  . Prediabetes 07/04/2016  . Attention deficit hyperactivity disorder (ADHD) 12/04/2015  . Depression with anxiety 12/04/2015  . Morbid obesity with BMI of 40.0-44.9, adult (HCC) 10/13/2015  . Extrinsic asthma 09/15/2013    Past Surgical History:  Procedure Laterality Date  . ADENOIDECTOMY     1998- only adenoids    Prior to Admission medications   Medication Sig Start Date End Date Taking? Authorizing Provider  albuterol (PROAIR HFA) 108 (90 BASE) MCG/ACT inhaler Inhale 2 puffs into the lungs every 6 (six) hours as needed for wheezing or shortness of breath. 09/01/15   Nyoka CowdenWert, Michael B, MD  ENSKYCE 0.15-30 MG-MCG tablet TAKE 1 TABLET BY MOUTH  DAILY 06/29/17   Everlene Otherook, Jayce G, DO  escitalopram (LEXAPRO) 20 MG tablet Take 1 tablet (20 mg total) by mouth daily. 07/04/16   Everlene Otherook, Jayce G, DO  ipratropium (ATROVENT) 0.06 % nasal spray Place 2 sprays into both nostrils 4 (four) times daily. 12/14/13   Rodolph Bongorey, Evan S, MD  loratadine (CLARITIN)  10 MG tablet Take 10 mg by mouth daily.    [provider]  metFORMIN (GLUCOPHAGE) 500 MG tablet Take 2 tablets (1,000 mg) in the morning with a meal and 2 tablets (1,000 mg) at night with a meal. 07/11/16   Cook, Jayce G, DO  montelukast (SINGULAIR) 10 MG tablet Take 1 tablet (10 mg total) by mouth at bedtime. 07/04/16   Tommie Samsook, Jayce G, DO  oseltamivir (TAMIFLU) 75 MG capsule Take 1 capsule (75 mg total) by mouth 2 (two) times daily. 10/27/16   Emi BelfastGessner, Deborah B, FNP  QVAR 80 MCG/ACT inhaler TAKE 2 PUFFS FIRST THING IN THE MORNING AND THEN  ANOTHER 2 PUFFS ABOUT 12  HOURS LATER. 11/13/16   Nyoka CowdenWert, Michael B, MD    Allergies Augmentin [amoxicillin-pot clavulanate]  Family History  Problem Relation Age of Onset  . Rheum arthritis Maternal Grandmother   . Hyperlipidemia Maternal Grandmother   . Hypertension Maternal Grandmother   . Arthritis Maternal Grandmother   . Hyperlipidemia Mother   . Diabetes Brother   . Hyperlipidemia Maternal Grandfather   . Hypertension Maternal Grandfather   Father has a history of gallstones  Social History Social History  Substance Use Topics  . Smoking status: Never Smoker  . Smokeless tobacco: Not on file  . Alcohol use 0.0 oz/week     Comment: rare    Review of Systems Constitutional: No fever/chills Eyes: No visual changes. ENT: No sore throat. Cardiovascular:  Denies chest pain. Respiratory: Denies shortness of breath. Gastrointestinal:Positive for abdominal pain and nausea, no vomiting.  No diarrhea.  No constipation. Genitourinary: Negative for dysuria. Musculoskeletal: Negative for neck pain.  Negative for back pain. Integumentary: Negative for rash. Neurological: Negative for headaches, focal weakness or numbness.   ____________________________________________   PHYSICAL EXAM:  VITAL SIGNS: ED Triage Vitals  Enc Vitals Group     BP 07/05/17 0330 (!) 153/106     Pulse Rate 07/05/17 0330 81     Resp 07/05/17 0330 18     Temp  07/05/17 0330 98 F (36.7 C)     Temp Source 07/05/17 0330 Oral     SpO2 07/05/17 0330 97 %     Weight 07/05/17 0331 120.7 kg (266 lb)     Height 07/05/17 0331 1.676 m ( )     Head Circumference --      Peak Flow --      Pain Score 07/05/17 0330 8     Pain Loc --      Pain Edu? --      Excl. in GC? --     Constitutional: Alert and oriented. Well appearing and in no acute distress. Eyes: Conjunctivae are normal. Head: Atraumatic. Mouth/Throat: Mucous membranes are moist.  Oropharynx non-erythematous. Neck: No stridor.   Cardiovascular: Normal rate, regular rhythm. Good peripheral circulation. Grossly normal heart sounds. Respiratory: Normal respiratory effort.  No retractions. Lungs CTAB. Gastrointestinal: Right upper quadrant/right lower quadrant tenderness to palpation No distention.  Musculoskeletal: No lower extremity tenderness nor edema. No gross deformities of extremities. Neurologic:  Normal speech and language. No gross focal neurologic deficits are appreciated.  Skin:  Skin is warm, dry and intact. No rash noted. Psychiatric: Mood and affect are normal. Speech and behavior are normal.  ____________________________________________   LABS (all labs ordered are listed, but only abnormal results are displayed)  Labs Reviewed  COMPREHENSIVE METABOLIC PANEL - Abnormal; Notable for the following:       Result Value   Glucose, Bld 133 (*)    Calcium 8.6 (*)    Albumin 3.3 (*)    All other components within normal limits  CBC - Abnormal; Notable for the following:    RDW 15.0 (*)    All other components within normal limits  URINALYSIS, COMPLETE (UACMP) WITH MICROSCOPIC - Abnormal; Notable for the following:    Specific Gravity, Urine >1.030 (*)    Ketones, ur TRACE (*)    Leukocytes, UA TRACE (*)    Squamous Epithelial / LPF 6-30 (*)    Bacteria, UA MANY (*)    All other components within normal limits  LIPASE, BLOOD  POC URINE PREG, ED  POCT PREGNANCY, URINE       RADIOLOGY I, [place] BROWN, personally viewed and evaluated these images (plain radiographs) as part of my medical decision making, as well as reviewing the written report by the radiologist.  Ct Abdomen Pelvis W Contrast  Result Date: 07/05/2017 CLINICAL DATA:  Right upper quadrant and right lower quadrant tenderness. Abdominal pain. EXAM: CT ABDOMEN AND PELVIS WITH CONTRAST TECHNIQUE: Multidetector CT imaging of the abdomen and pelvis was performed using the standard protocol following bolus administration of intravenous contrast. CONTRAST:  ISOVUE-300 IOPAMIDOL (ISOVUE-300) INJECTION 61% COMPARISON:  None. FINDINGS: Lower chest: Trace right pleural thickening or effusion. Hepatobiliary: Diffusely decreased hepatic density consistent with steatosis. Mild fatty sparing adjacent gallbladder fossa. No focal hepatic lesion. Gallbladder physiologically distended, no calcified stone. No biliary dilatation. Pancreas:  No ductal dilatation or inflammation. Spleen: Normal in size without focal abnormality. Splenules anteriorly. Adrenals/Urinary Tract: Adrenal glands are unremarkable. Kidneys are normal, without renal calculi, focal lesion, or hydronephrosis. Bladder is unremarkable. Stomach/Bowel: Stomach is within normal limits. Appendix appears normal. No evidence of bowel wall thickening, distention, or inflammatory changes. Vascular/Lymphatic: No significant vascular findings are present. No enlarged abdominal or pelvic lymph nodes. Reproductive: Uterus and bilateral adnexa are unremarkable. Other: No free air, free fluid, or intra-abdominal fluid collection. Musculoskeletal: There are no acute or suspicious osseous abnormalities. IMPRESSION: 1. No acute abnormality. 2. Hepatic steatosis. Electronically Signed   By: Rubye Oaks M.D.   On: 07/05/2017 06:40    Procedures   ____________________________________________   INITIAL IMPRESSION / ASSESSMENT AND PLAN / ED COURSE  Pertinent  labs & imaging results that were available during my care of the patient were reviewed by me and considered in my medical decision making (see chart for details).  21 year old female presenting with history of physical exam concerning for possible gallbladder disease versus appendicitis given right lower quadrant tenderness as well however I suspect gallbladder disease more likely. CT scan of the abdomen revealed no acute pathology. We'll obtain ultrasound of the right upper quadrant. Spoke with the patient and her family at length and advised them that she would be discharged home with the ultrasound were negative with follow-up with gastroenterology.      ____________________________________________  FINAL CLINICAL IMPRESSION(S) / ED DIAGNOSES  Final diagnoses:  RUQ pain  Epigastric pain     MEDICATIONS GIVEN DURING THIS VISIT:  Medications  morphine 2 MG/ML injection 2 mg (2 mg Intravenous Given 07/05/17 0501)  ondansetron (ZOFRAN) injection 4 mg (4 mg Intravenous Given 07/05/17 0501)  sodium chloride 0.9 % bolus 1,000 mL (1,000 mLs Intravenous New Bag/Given 07/05/17 0501)  iopamidol (ISOVUE-300) 61 % injection 30 mL (30 mLs Oral Contrast Given 07/05/17 0508)  iopamidol (ISOVUE-300) 61 % injection 100 mL (100 mLs Intravenous Contrast Given 07/05/17 0610)     NEW OUTPATIENT MEDICATIONS STARTED DURING THIS VISIT:  New Prescriptions   No medications on file    Modified Medications   No medications on file    Discontinued Medications   No medications on file     Note:  This document was prepared using Dragon voice recognition software and may include unintentional dictation errors.    Darci Current, MD 07/05/17 [phone]

## 2017-07-05 NOTE — ED Notes (Signed)
Pt states pain in upper abdomin since 6pm last night. Feels nauseous. Family at bedside.

## 2017-07-05 NOTE — ED Notes (Signed)
Pt out of room at this time at ultrasound. Will assess when patient returns

## 2017-07-05 NOTE — ED Notes (Signed)
Dr. Brown at bedside

## 2017-07-05 NOTE — ED Triage Notes (Addendum)
Pt presents to ED with sudden onset of "dull" epigastric abd pain. Pt reports having nausea and diarrhea (X1); denies vomiting. Pt states her pain increases when laying down. denies similar symptoms previously.

## 2017-07-05 NOTE — ED Provider Notes (Signed)
-----------------------------------------   8:30 AM on 07/05/2017 -----------------------------------------  Patient's workup has been largely normal. Urinalysis does show many bacteria but otherwise normal. Labs are normal. CT scan shows no acute findings. Ultrasound shows no acute findings. We will cover with fosfomycin for possible urinary tract infection, urine culture has been added on to the patient's workup. I discussed this with the patient is agreeable to this plan. Currently she denies any pain. On my examination she has a completely nontender abdomen at this time. Patient will be discharged home with PCP follow-up.   Minna AntisPaduchowski, Joice Nazario, MD 07/05/17 [phone]

## 2017-07-06 LAB — URINE CULTURE: Culture: NO GROWTH

## 2017-07-06 NOTE — Progress Notes (Signed)
Appt cancelled.  Amy Other DO [facility]

## 2017-07-12 ENCOUNTER — Other Ambulatory Visit: Payer: Self-pay | Admitting: Family Medicine

## 2017-08-09 ENCOUNTER — Encounter: Payer: Self-pay | Admitting: Family

## 2017-08-09 ENCOUNTER — Telehealth: Payer: Self-pay | Admitting: Family Medicine

## 2017-08-09 NOTE — Telephone Encounter (Signed)
FYI, Pt called she got called into work unexpectedly and cannot make her appt. Pt did reschedule another appt. Do you want me to cancel old appt? Thank you!

## 2017-08-09 NOTE — Telephone Encounter (Signed)
Please advise 

## 2017-08-10 NOTE — Telephone Encounter (Signed)
We do NOT need to no show

## 2017-08-10 NOTE — Telephone Encounter (Signed)
Please advise 

## 2017-08-10 NOTE — Telephone Encounter (Signed)
Ok

## 2017-08-22 ENCOUNTER — Encounter: Payer: Self-pay | Admitting: Family

## 2017-08-22 ENCOUNTER — Other Ambulatory Visit (HOSPITAL_COMMUNITY)
Admission: RE | Admit: 2017-08-22 | Discharge: 2017-08-22 | Disposition: A | Discharge status: Home / Self Care | Payer: 59 | Source: Ambulatory Visit | Attending: Surgery | Admitting: Surgery

## 2017-08-22 ENCOUNTER — Ambulatory Visit (INDEPENDENT_AMBULATORY_CARE_PROVIDER_SITE_OTHER): Payer: 59 | Admitting: Family

## 2017-08-22 VITALS — BP 126/76 | HR 98 | Temp 98.1°F | Ht 66.0 in | Wt 267.8 lb

## 2017-08-22 DIAGNOSIS — R7303 Prediabetes: Secondary | ICD-10-CM | POA: Diagnosis not present

## 2017-08-22 DIAGNOSIS — Z7984 Long term (current) use of oral hypoglycemic drugs: Secondary | ICD-10-CM | POA: Insufficient documentation

## 2017-08-22 DIAGNOSIS — F329 Major depressive disorder, single episode, unspecified: Secondary | ICD-10-CM | POA: Diagnosis not present

## 2017-08-22 DIAGNOSIS — F418 Other specified anxiety disorders: Secondary | ICD-10-CM | POA: Diagnosis not present

## 2017-08-22 DIAGNOSIS — J4520 Mild intermittent asthma, uncomplicated: Secondary | ICD-10-CM | POA: Insufficient documentation

## 2017-08-22 DIAGNOSIS — Z0000 Encounter for general adult medical examination without abnormal findings: Secondary | ICD-10-CM | POA: Insufficient documentation

## 2017-08-22 LAB — COMPREHENSIVE METABOLIC PANEL
ALK PHOS: 59 U/L (ref 39–117)
ALT: 11 U/L (ref 0–35)
AST: 12 U/L (ref 0–37)
Albumin: 3.7 g/dL (ref 3.5–5.2)
BILIRUBIN TOTAL: 0.4 mg/dL (ref 0.2–1.2)
BUN: 13 mg/dL (ref 6–23)
CALCIUM: 9.3 mg/dL (ref 8.4–10.5)
CO2: 25 meq/L (ref 19–32)
CREATININE: 0.76 mg/dL (ref 0.40–1.20)
Chloride: 103 mEq/L (ref 96–112)
GFR: 100.12 mL/min (ref >60.00)
GLUCOSE: 125 mg/dL — AB (ref 70–99)
Potassium: 4.2 mEq/L (ref 3.5–5.1)
Sodium: 136 mEq/L (ref 135–145)
TOTAL PROTEIN: 7.6 g/dL (ref 6.0–8.3)

## 2017-08-22 LAB — CBC WITH DIFFERENTIAL/PLATELET
BASOS ABS: 0 10*3/uL (ref 0.0–0.1)
Basophils Relative: 0.6 % (ref 0.0–3.0)
EOS ABS: 0.5 10*3/uL (ref 0.0–0.7)
Eosinophils Relative: 5.7 % — ABNORMAL HIGH (ref 0.0–5.0)
HEMATOCRIT: 40 % (ref 36.0–46.0)
HEMOGLOBIN: 13.3 g/dL (ref 12.0–15.0)
LYMPHS PCT: 24.8 % (ref 12.0–46.0)
Lymphs Abs: 2.1 10*3/uL (ref 0.7–4.0)
MCHC: 33.2 g/dL (ref 30.0–36.0)
MCV: 87.7 fl (ref 78.0–100.0)
MONOS PCT: 5.6 % (ref 3.0–12.0)
Monocytes Absolute: 0.5 10*3/uL (ref 0.1–1.0)
NEUTROS ABS: 5.5 10*3/uL (ref 1.4–7.7)
Neutrophils Relative %: 63.3 % (ref 43.0–77.0)
PLATELETS: 326 10*3/uL (ref 150.0–400.0)
RBC: 4.56 Mil/uL (ref 3.87–5.11)
RDW: 15.1 % (ref 11.5–15.5)
WBC: 8.6 10*3/uL (ref 4.0–10.5)

## 2017-08-22 LAB — LDL CHOLESTEROL, DIRECT: LDL DIRECT: 59 mg/dL

## 2017-08-22 LAB — VITAMIN D 25 HYDROXY (VIT D DEFICIENCY, FRACTURES): VITD: 26.43 ng/mL — ABNORMAL LOW (ref 30.00–100.00)

## 2017-08-22 LAB — LIPID PANEL
CHOL/HDL RATIO: 3
CHOLESTEROL: 137 mg/dL (ref 0–200)
HDL: 47 mg/dL (ref >39.00)
NONHDL: 90.22
Triglycerides: 277 mg/dL — ABNORMAL HIGH (ref 0.0–149.0)
VLDL: 55.4 mg/dL — ABNORMAL HIGH (ref 0.0–40.0)

## 2017-08-22 LAB — HEMOGLOBIN A1C: Hgb A1c MFr Bld: 6.6 % — ABNORMAL HIGH (ref 4.6–6.5)

## 2017-08-22 LAB — TSH: TSH: 2.93 u[IU]/mL (ref 0.35–4.50)

## 2017-08-22 MED ORDER — ESCITALOPRAM OXALATE 10 MG PO TABS: 10.0000 mg | ORAL_TABLET | Freq: Every day | ORAL | 1 refills | Status: DC

## 2017-08-22 MED ORDER — MONTELUKAST SODIUM 10 MG PO TABS: 10.0000 mg | ORAL_TABLET | Freq: Every day | ORAL | 3 refills | Status: DC

## 2017-08-22 MED ORDER — METFORMIN HCL ER 500 MG PO TB24: 1000.0000 mg | ORAL_TABLET | Freq: Every day | ORAL | 1 refills | Status: DC

## 2017-08-22 NOTE — Progress Notes (Signed)
Pre visit review using our clinic review tool, if applicable. No additional management support is needed unless otherwise documented below in the visit note. 

## 2017-08-22 NOTE — Assessment & Plan Note (Signed)
Clinical breast exam and Pap smear performed today. Encouraged exercise. Declines STD testing.

## 2017-08-22 NOTE — Progress Notes (Signed)
Subjective:    [name], female    DOB: June 03, 1996, 21 y.o.   MRN: [id number]  CC: Amy Olson is a 21 y.o. female who presents today for physical exam.    HPI: ashtma- follows with Dr Elige KoWort; no wheezing, noctural coughing, sob.   Depression- has been on lexapro and feels well. Had been at 10mg  in the past and would like to go back to smaller dose, or even stop medication all together. No thoughts of hurting herself or anyone else.  Prediabetes- compliant with metformin; taking 1000mg  at night.     Colorectal Cancer Screening: no early family history Breast Cancer Screening: no early family history Cervical Cancer Screening: due   Bone Health screening/DEXA for 65+: No increased fracture risk. Defer screening at this time. Lung Cancer Screening: Doesn't have 30 year pack year history and age > 55 years.       Tetanus - due         HIV Screening- Candidate for ; declines STD testing including hiv Labs: Screening labs today. Exercise: Gets regular exercise.  Alcohol use: Occasional Smoking/tobacco use: Nonsmoker.  Regular dental exams: UTD Wears seat belt: Yes. Skin: no new concerning lesion  HISTORY:  Past Medical History:  Diagnosis Date  . allergies   . Allergy   . Asthma   . Depression   . Diabetes mellitus without complication (HCC)    prediabetic    Past Surgical History:  Procedure Laterality Date  . ADENOIDECTOMY     1998- only adenoids   Family History  Problem Relation Age of Onset  . Rheum arthritis Maternal Grandmother   . Hyperlipidemia Maternal Grandmother   . Hypertension Maternal Grandmother   . Arthritis Maternal Grandmother   . Hyperlipidemia Mother   . Diabetes Brother   . Hyperlipidemia Maternal Grandfather   . Hypertension Maternal Grandfather   . Colon cancer Neg Hx   . Breast cancer Neg Hx       ALLERGIES: Augmentin [amoxicillin-pot clavulanate]  Current Outpatient Prescriptions on File Prior to Visit    Medication Sig Dispense Refill  . albuterol (PROAIR HFA) 108 (90 BASE) MCG/ACT inhaler Inhale 2 puffs into the lungs every 6 (six) hours as needed for wheezing or shortness of breath. 1 Inhaler 1  . ENSKYCE 0.15-30 MG-MCG tablet TAKE 1 TABLET BY MOUTH  DAILY 84 tablet 3  . QVAR 80 MCG/ACT inhaler TAKE 2 PUFFS FIRST THING IN THE MORNING AND THEN  ANOTHER 2 PUFFS ABOUT 12  HOURS LATER. 3 Inhaler 0   No current facility-administered medications on file prior to visit.     Social History  Substance Use Topics  . Smoking status: Never Smoker  . Smokeless tobacco: Not on file  . Alcohol use 0.0 oz/week     Comment: rare    Review of Systems  Constitutional: Negative for chills and fever.  Respiratory: Negative for cough, shortness of breath and wheezing.   Cardiovascular: Negative for chest pain and palpitations.  Gastrointestinal: Negative for nausea and vomiting.  Genitourinary: Negative for dysuria, vaginal discharge and vaginal pain.      Objective:    BP 126/76   Pulse 98   Temp 98.1 F (36.7 C) (Oral)   Ht 5\' 6"  (1.676 m)   Wt 267 lb 12.8 oz (121.5 kg)   SpO2 96%   BMI 43.22 kg/m   BP Readings from Last 3 Encounters:  08/22/17 126/76  07/05/17 110/90  07/05/17 129/88   Wt  Readings from Last 3 Encounters:  08/22/17 267 lb 12.8 oz (121.5 kg)  07/05/17 270 lb (122.5 kg)  07/05/17 266 lb (120.7 kg)    Physical Exam  Constitutional: She appears well-developed and well-nourished.  Eyes: Conjunctivae are normal.  Neck: No thyroid mass and no thyromegaly present.  Cardiovascular: Normal rate, regular rhythm, normal heart sounds and normal pulses.   Pulmonary/Chest: Effort normal and breath sounds normal. She has no wheezes. She has no rhonchi. She has no rales. Right breast exhibits no inverted nipple, no mass, no nipple discharge, no skin change and no tenderness. Left breast exhibits no inverted nipple, no mass, no nipple discharge, no skin change and no tenderness.  Breasts are symmetrical.  No masses or asymmetry appreciated during CBE.  Genitourinary: Uterus is not enlarged, not fixed and not tender. Cervix exhibits no motion tenderness, no discharge and no friability. Right adnexum displays no mass, no tenderness and no fullness. Left adnexum displays no mass, no tenderness and no fullness.  Genitourinary Comments: Pap performed. No CMT. Unable to appreciated ovaries.  Lymphadenopathy:       Head (right side): No submental, no submandibular, no tonsillar, no preauricular, no posterior auricular and no occipital adenopathy present.       Head (left side): No submental, no submandibular, no tonsillar, no preauricular, no posterior auricular and no occipital adenopathy present.       Right cervical: No superficial cervical, no deep cervical and no posterior cervical adenopathy present.      Left cervical: No superficial cervical, no deep cervical and no posterior cervical adenopathy present.    She has no axillary adenopathy.       Right axillary: No pectoral and no lateral adenopathy present.       Left axillary: No pectoral and no lateral adenopathy present. Neurological: She is alert.  Skin: Skin is warm and dry.  Psychiatric: She has a normal mood and affect. Her speech is normal and behavior is normal. Thought content normal.  Vitals reviewed.      Assessment & Plan:   Problem List Items Addressed This Visit      Respiratory   Extrinsic asthma    Stable. Well-controlled. Continue current regimen      Relevant Medications   montelukast (SINGULAIR) 10 MG tablet     Other   Routine physical examination - Primary    Clinical breast exam and Pap smear performed today. Encouraged exercise. Declines STD testing.      Relevant Medications   metFORMIN (GLUCOPHAGE-XR) 500 MG 24 hr tablet   Other Relevant Orders   Ambulatory referral to Dermatology   CBC with Differential/Platelet   Comprehensive metabolic panel   Hemoglobin A1c   Lipid  panel   Cytology - PAP   VITAMIN D 25 Hydroxy (Vit-D Deficiency, Fractures)   TSH   Depression with anxiety    Doing well. Prefers to decrease dose of Lexapro and ultimately would like to come off medication. We will start by decreasing medication.      Relevant Medications   escitalopram (LEXAPRO) 10 MG tablet   Prediabetes    Pending A1c. Change metformin to extended release since patient was only remembering to take medication once a day.      Relevant Medications   metFORMIN (GLUCOPHAGE-XR) 500 MG 24 hr tablet   escitalopram (LEXAPRO) 10 MG tablet       I have discontinued Amy Olson metFORMIN and escitalopram. I am also having her start on metFORMIN and escitalopram. Additionally,  I am having her maintain her albuterol, QVAR, ENSKYCE, and montelukast.   Meds ordered this encounter  Medications  . montelukast (SINGULAIR) 10 MG tablet    Sig: Take 1 tablet (10 mg total) by mouth at bedtime.    Dispense:  90 tablet    Refill:  3  . metFORMIN (GLUCOPHAGE-XR) 500 MG 24 hr tablet    Sig: Take 2 tablets (1,000 mg total) by mouth daily.    Dispense:  90 tablet    Refill:  1    Order Specific Question:   Supervising Provider    Answer:   Duncan Dull L [[id number]]  . escitalopram (LEXAPRO) 10 MG tablet    Sig: Take 1 tablet (10 mg total) by mouth daily.    Dispense:  90 tablet    Refill:  1    Order Specific Question:   Supervising Provider    Answer:   Sherlene Shams [[id number]]    Return precautions given.   Risks, benefits, and alternatives of the medications and treatment plan prescribed today were discussed, and patient expressed understanding.   Education regarding symptom management and diagnosis given to patient on AVS.   Continue to follow with Allegra Grana, FNP for routine health maintenance.   Amy Repress and I agreed with plan.   Rennie Plowman, FNP

## 2017-08-22 NOTE — Assessment & Plan Note (Signed)
Pending A1c. Change metformin to extended release since patient was only remembering to take medication once a day.

## 2017-08-22 NOTE — Patient Instructions (Addendum)
Tdap ( tetanus vaccine) at local pharmacy.   Trial metformin XR 1000 once per day  Trial lexapro '10mg'$ ; I would advise you to alternate Lexapro 20 mg one day with Lexapro 10 mg the next day- do this for ONE week prior to starting lexapro '10mg'$  every day.  I want you to wean down slowly.   Labs   Health Maintenance, Female Adopting a healthy lifestyle and getting preventive care can go a long way to promote health and wellness. Talk with your health care provider about what schedule of regular examinations is right for you. This is a good chance for you to check in with your provider about disease prevention and staying healthy. In between checkups, there are plenty of things you can do on your own. Experts have done a lot of research about which lifestyle changes and preventive measures are most likely to keep you healthy. Ask your health care provider for more information. Weight and diet Eat a healthy diet  Be sure to include plenty of vegetables, fruits, low-fat dairy products, and lean protein.  Do not eat a lot of foods high in solid fats, added sugars, or salt.  Get regular exercise. This is one of the most important things you can do for your health. ? Most adults should exercise for at least 150 minutes each week. The exercise should increase your heart rate and make you sweat (moderate-intensity exercise). ? Most adults should also do strengthening exercises at least twice a week. This is in addition to the moderate-intensity exercise.  Maintain a healthy weight  Body mass index (BMI) is a measurement that can be used to identify possible weight problems. It estimates body fat based on height and weight. Your health care provider can help determine your BMI and help you achieve or maintain a healthy weight.  For females 44 years of age and older: ? A BMI below 18.5 is considered underweight. ? A BMI of 18.5 to 24.9 is normal. ? A BMI of 25 to 29.9 is considered overweight. ? A  BMI of 30 and above is considered obese.  Watch levels of cholesterol and blood lipids  You should start having your blood tested for lipids and cholesterol at 23 years of age, then have this test every 5 years.  You may need to have your cholesterol levels checked more often if: ? Your lipid or cholesterol levels are high. ? You are older than 23 years of age. ? You are at high risk for heart disease.  Cancer screening Lung Cancer  Lung cancer screening is recommended for adults 58-2 years old who are at high risk for lung cancer because of a history of smoking.  A yearly low-dose CT scan of the lungs is recommended for people who: ? Currently smoke. ? Have quit within the past 15 years. ? Have at least a 30-pack-year history of smoking. A pack year is smoking an average of one pack of cigarettes a day for 1 year.  Yearly screening should continue until it has been 15 years since you quit.  Yearly screening should stop if you develop a health problem that would prevent you from having lung cancer treatment.  Breast Cancer  Practice breast self-awareness. This means understanding how your breasts normally appear and feel.  It also means doing regular breast self-exams. Let your health care provider know about any changes, no matter how small.  If you are in your 20s or 30s, you should have a clinical breast exam (  CBE) by a health care provider every 1-3 years as part of a regular health exam.  If you are 68 or older, have a CBE every year. Also consider having a breast X-ray (mammogram) every year.  If you have a family history of breast cancer, talk to your health care provider about genetic screening.  If you are at high risk for breast cancer, talk to your health care provider about having an MRI and a mammogram every year.  Breast cancer gene (BRCA) assessment is recommended for women who have family members with BRCA-related cancers. BRCA-related cancers  include: ? Breast. ? Ovarian. ? Tubal. ? Peritoneal cancers.  Results of the assessment will determine the need for genetic counseling and BRCA1 and BRCA2 testing.  Cervical Cancer Your health care provider may recommend that you be screened regularly for cancer of the pelvic organs (ovaries, uterus, and vagina). This screening involves a pelvic examination, including checking for microscopic changes to the surface of your cervix (Pap test). You may be encouraged to have this screening done every 3 years, beginning at age 70.  For women ages 56-65, health care providers may recommend pelvic exams and Pap testing every 3 years, or they may recommend the Pap and pelvic exam, combined with testing for human papilloma virus (HPV), every 5 years. Some types of HPV increase your risk of cervical cancer. Testing for HPV may also be done on women of any age with unclear Pap test results.  Other health care providers may not recommend any screening for nonpregnant women who are considered low risk for pelvic cancer and who do not have symptoms. Ask your health care provider if a screening pelvic exam is right for you.  If you have had past treatment for cervical cancer or a condition that could lead to cancer, you need Pap tests and screening for cancer for at least 20 years after your treatment. If Pap tests have been discontinued, your risk factors (such as having a new sexual partner) need to be reassessed to determine if screening should resume. Some women have medical problems that increase the chance of getting cervical cancer. In these cases, your health care provider may recommend more frequent screening and Pap tests.  Colorectal Cancer  This type of cancer can be detected and often prevented.  Routine colorectal cancer screening usually begins at 23 years of age and continues through 23 years of age.  Your health care provider may recommend screening at an earlier age if you have risk factors  for colon cancer.  Your health care provider may also recommend using home test kits to check for hidden blood in the stool.  A small camera at the end of a tube can be used to examine your colon directly (sigmoidoscopy or colonoscopy). This is done to check for the earliest forms of colorectal cancer.  Routine screening usually begins at age 58.  Direct examination of the colon should be repeated every 5-10 years through 23 years of age. However, you may need to be screened more often if early forms of precancerous polyps or small growths are found.  Skin Cancer  Check your skin from head to toe regularly.  Tell your health care provider about any new moles or changes in moles, especially if there is a change in a mole's shape or color.  Also tell your health care provider if you have a mole that is larger than the size of a pencil eraser.  Always use sunscreen. Apply sunscreen liberally  and repeatedly throughout the day.  Protect yourself by wearing long sleeves, pants, a wide-brimmed hat, and sunglasses whenever you are outside.  Heart disease, diabetes, and high blood pressure  High blood pressure causes heart disease and increases the risk of stroke. High blood pressure is more likely to develop in: ? People who have blood pressure in the high end of the normal range (130-139/85-89 mm Hg). ? People who are overweight or obese. ? People who are African American.  If you are 46-16 years of age, have your blood pressure checked every 3-5 years. If you are 10 years of age or older, have your blood pressure checked every year. You should have your blood pressure measured twice-once when you are at a hospital or clinic, and once when you are not at a hospital or clinic. Record the average of the two measurements. To check your blood pressure when you are not at a hospital or clinic, you can use: ? An automated blood pressure machine at a pharmacy. ? A home blood pressure monitor.  If  you are between 28 years and 79 years old, ask your health care provider if you should take aspirin to prevent strokes.  Have regular diabetes screenings. This involves taking a blood sample to check your fasting blood sugar level. ? If you are at a normal weight and have a low risk for diabetes, have this test once every three years after 23 years of age. ? If you are overweight and have a high risk for diabetes, consider being tested at a younger age or more often. Preventing infection Hepatitis B  If you have a higher risk for hepatitis B, you should be screened for this virus. You are considered at high risk for hepatitis B if: ? You were born in a country where hepatitis B is common. Ask your health care provider which countries are considered high risk. ? Your parents were born in a high-risk country, and you have not been immunized against hepatitis B (hepatitis B vaccine). ? You have HIV or AIDS. ? You use needles to inject street drugs. ? You live with someone who has hepatitis B. ? You have had sex with someone who has hepatitis B. ? You get hemodialysis treatment. ? You take certain medicines for conditions, including cancer, organ transplantation, and autoimmune conditions.  Hepatitis C  Blood testing is recommended for: ? Everyone born from [birth year] through 1967. ? Anyone with known risk factors for hepatitis C.  Sexually transmitted infections (STIs)  You should be screened for sexually transmitted infections (STIs) including gonorrhea and chlamydia if: ? You are sexually active and are younger than 23 years of age. ? You are older than 23 years of age and your health care provider tells you that you are at risk for this type of infection. ? Your sexual activity has changed since you were last screened and you are at an increased risk for chlamydia or gonorrhea. Ask your health care provider if you are at risk.  If you do not have HIV, but are at risk, it may be recommended  that you take a prescription medicine daily to prevent HIV infection. This is called pre-exposure prophylaxis (PrEP). You are considered at risk if: ? You are sexually active and do not regularly use condoms or know the HIV status of your partner(s). ? You take drugs by injection. ? You are sexually active with a partner who has HIV.  Talk with your health care provider about whether  you are at high risk of being infected with HIV. If you choose to begin PrEP, you should first be tested for HIV. You should then be tested every 3 months for as long as you are taking PrEP. Pregnancy  If you are premenopausal and you may become pregnant, ask your health care provider about preconception counseling.  If you may become pregnant, take 400 to 800 micrograms (mcg) of folic acid every day.  If you want to prevent pregnancy, talk to your health care provider about birth control (contraception). Osteoporosis and menopause  Osteoporosis is a disease in which the bones lose minerals and strength with aging. This can result in serious bone fractures. Your risk for osteoporosis can be identified using a bone density scan.  If you are 43 years of age or older, or if you are at risk for osteoporosis and fractures, ask your health care provider if you should be screened.  Ask your health care provider whether you should take a calcium or vitamin D supplement to lower your risk for osteoporosis.  Menopause may have certain physical symptoms and risks.  Hormone replacement therapy may reduce some of these symptoms and risks. Talk to your health care provider about whether hormone replacement therapy is right for you. Follow these instructions at home:  Schedule regular health, dental, and eye exams.  Stay current with your immunizations.  Do not use any tobacco products including cigarettes, chewing tobacco, or electronic cigarettes.  If you are pregnant, do not drink alcohol.  If you are  breastfeeding, limit how much and how often you drink alcohol.  Limit alcohol intake to no more than 1 drink per day for nonpregnant women. One drink equals 12 ounces of beer, 5 ounces of wine, or 1 ounces of hard liquor.  Do not use street drugs.  Do not share needles.  Ask your health care provider for help if you need support or information about quitting drugs.  Tell your health care provider if you often feel depressed.  Tell your health care provider if you have ever been abused or do not feel safe at home. This information is not intended to replace advice given to you by your health care provider. Make sure you discuss any questions you have with your health care provider. Document Released: 04/24/2011 Document Revised: 03/16/2016 Document Reviewed: 07/13/2015 Elsevier Interactive Patient Education  [employer].

## 2017-08-22 NOTE — Assessment & Plan Note (Signed)
Doing well. Prefers to decrease dose of Lexapro and ultimately would like to come off medication. We will start by decreasing medication.

## 2017-08-22 NOTE — Assessment & Plan Note (Signed)
Stable. Well-controlled. Continue current regimen. 

## 2017-08-23 LAB — CYTOLOGY - PAP: DIAGNOSIS: NEGATIVE

## 2017-10-05 DIAGNOSIS — E119 Type 2 diabetes mellitus without complications: Secondary | ICD-10-CM | POA: Diagnosis not present

## 2017-10-05 LAB — HM DIABETES EYE EXAM

## 2017-10-24 ENCOUNTER — Encounter: Payer: Self-pay | Admitting: Internal Medicine

## 2017-11-06 DIAGNOSIS — J0100 Acute maxillary sinusitis, unspecified: Secondary | ICD-10-CM | POA: Diagnosis not present

## 2017-11-06 DIAGNOSIS — J4531 Mild persistent asthma with (acute) exacerbation: Secondary | ICD-10-CM | POA: Diagnosis not present

## 2017-12-10 ENCOUNTER — Other Ambulatory Visit: Payer: Self-pay | Admitting: Family

## 2017-12-10 DIAGNOSIS — R7303 Prediabetes: Secondary | ICD-10-CM

## 2017-12-10 DIAGNOSIS — Z0000 Encounter for general adult medical examination without abnormal findings: Secondary | ICD-10-CM

## 2017-12-18 DIAGNOSIS — J040 Acute laryngitis: Secondary | ICD-10-CM | POA: Diagnosis not present

## 2018-01-04 ENCOUNTER — Other Ambulatory Visit: Payer: Self-pay | Admitting: Unknown Physician Specialty

## 2018-01-04 ENCOUNTER — Ambulatory Visit
Admission: RE | Admit: 2018-01-04 | Discharge: 2018-01-04 | Disposition: A | Discharge status: Home / Self Care | Payer: 59 | Source: Ambulatory Visit | Attending: Unknown Physician Specialty | Admitting: Unknown Physician Specialty

## 2018-01-04 DIAGNOSIS — J45991 Cough variant asthma: Secondary | ICD-10-CM | POA: Diagnosis not present

## 2018-01-04 DIAGNOSIS — R059 Cough, unspecified: Secondary | ICD-10-CM

## 2018-01-04 DIAGNOSIS — R05 Cough: Secondary | ICD-10-CM | POA: Diagnosis not present

## 2018-01-04 DIAGNOSIS — J301 Allergic rhinitis due to pollen: Secondary | ICD-10-CM | POA: Diagnosis not present

## 2018-02-04 DIAGNOSIS — J301 Allergic rhinitis due to pollen: Secondary | ICD-10-CM | POA: Diagnosis not present

## 2018-02-05 DIAGNOSIS — J301 Allergic rhinitis due to pollen: Secondary | ICD-10-CM | POA: Diagnosis not present

## 2018-02-11 DIAGNOSIS — J301 Allergic rhinitis due to pollen: Secondary | ICD-10-CM | POA: Diagnosis not present

## 2018-02-14 DIAGNOSIS — J301 Allergic rhinitis due to pollen: Secondary | ICD-10-CM | POA: Diagnosis not present

## 2018-02-18 DIAGNOSIS — J301 Allergic rhinitis due to pollen: Secondary | ICD-10-CM | POA: Diagnosis not present

## 2018-02-21 DIAGNOSIS — J301 Allergic rhinitis due to pollen: Secondary | ICD-10-CM | POA: Diagnosis not present

## 2018-02-25 DIAGNOSIS — J301 Allergic rhinitis due to pollen: Secondary | ICD-10-CM | POA: Diagnosis not present

## 2018-02-28 DIAGNOSIS — J301 Allergic rhinitis due to pollen: Secondary | ICD-10-CM | POA: Diagnosis not present

## 2018-03-04 DIAGNOSIS — J301 Allergic rhinitis due to pollen: Secondary | ICD-10-CM | POA: Diagnosis not present

## 2018-03-11 DIAGNOSIS — J301 Allergic rhinitis due to pollen: Secondary | ICD-10-CM | POA: Diagnosis not present

## 2018-03-14 DIAGNOSIS — J301 Allergic rhinitis due to pollen: Secondary | ICD-10-CM | POA: Diagnosis not present

## 2018-03-21 DIAGNOSIS — J301 Allergic rhinitis due to pollen: Secondary | ICD-10-CM | POA: Diagnosis not present

## 2018-03-25 DIAGNOSIS — J301 Allergic rhinitis due to pollen: Secondary | ICD-10-CM | POA: Diagnosis not present

## 2018-03-28 DIAGNOSIS — J301 Allergic rhinitis due to pollen: Secondary | ICD-10-CM | POA: Diagnosis not present

## 2018-04-01 DIAGNOSIS — J301 Allergic rhinitis due to pollen: Secondary | ICD-10-CM | POA: Diagnosis not present

## 2018-04-04 DIAGNOSIS — J301 Allergic rhinitis due to pollen: Secondary | ICD-10-CM | POA: Diagnosis not present

## 2018-04-08 DIAGNOSIS — J301 Allergic rhinitis due to pollen: Secondary | ICD-10-CM | POA: Diagnosis not present

## 2018-04-10 ENCOUNTER — Other Ambulatory Visit: Payer: Self-pay | Admitting: Family

## 2018-04-10 DIAGNOSIS — R7303 Prediabetes: Secondary | ICD-10-CM

## 2018-04-11 DIAGNOSIS — J301 Allergic rhinitis due to pollen: Secondary | ICD-10-CM | POA: Diagnosis not present

## 2018-04-11 NOTE — Telephone Encounter (Signed)
FYI

## 2018-04-11 NOTE — Telephone Encounter (Signed)
Script sent  

## 2018-04-11 NOTE — Telephone Encounter (Signed)
The patient says she's taking 10mg  of Lexapreo.

## 2018-04-11 NOTE — Telephone Encounter (Addendum)
Left voice mail for patient to call back ok for [facility] to speak to patient  Want to confirm dose patient is taking , last office visit patient wanting to come off medication, looks like she is taking daily

## 2018-04-15 DIAGNOSIS — J301 Allergic rhinitis due to pollen: Secondary | ICD-10-CM | POA: Diagnosis not present

## 2018-04-18 DIAGNOSIS — J301 Allergic rhinitis due to pollen: Secondary | ICD-10-CM | POA: Diagnosis not present

## 2018-04-27 ENCOUNTER — Other Ambulatory Visit: Payer: Self-pay | Admitting: Family Medicine

## 2018-04-29 DIAGNOSIS — J301 Allergic rhinitis due to pollen: Secondary | ICD-10-CM | POA: Diagnosis not present

## 2018-05-01 DIAGNOSIS — M25552 Pain in left hip: Secondary | ICD-10-CM | POA: Diagnosis not present

## 2018-05-01 DIAGNOSIS — M25562 Pain in left knee: Secondary | ICD-10-CM | POA: Diagnosis not present

## 2018-05-02 DIAGNOSIS — J301 Allergic rhinitis due to pollen: Secondary | ICD-10-CM | POA: Diagnosis not present

## 2018-05-13 DIAGNOSIS — J301 Allergic rhinitis due to pollen: Secondary | ICD-10-CM | POA: Diagnosis not present

## 2018-05-16 DIAGNOSIS — J301 Allergic rhinitis due to pollen: Secondary | ICD-10-CM | POA: Diagnosis not present

## 2018-05-20 DIAGNOSIS — J301 Allergic rhinitis due to pollen: Secondary | ICD-10-CM | POA: Diagnosis not present

## 2018-05-23 DIAGNOSIS — J301 Allergic rhinitis due to pollen: Secondary | ICD-10-CM | POA: Diagnosis not present

## 2018-05-27 DIAGNOSIS — J301 Allergic rhinitis due to pollen: Secondary | ICD-10-CM | POA: Diagnosis not present

## 2018-05-30 DIAGNOSIS — J301 Allergic rhinitis due to pollen: Secondary | ICD-10-CM | POA: Diagnosis not present

## 2018-06-04 DIAGNOSIS — M25562 Pain in left knee: Secondary | ICD-10-CM | POA: Diagnosis not present

## 2018-06-04 DIAGNOSIS — M25552 Pain in left hip: Secondary | ICD-10-CM | POA: Diagnosis not present

## 2018-06-06 DIAGNOSIS — J301 Allergic rhinitis due to pollen: Secondary | ICD-10-CM | POA: Diagnosis not present

## 2018-06-10 DIAGNOSIS — J301 Allergic rhinitis due to pollen: Secondary | ICD-10-CM | POA: Diagnosis not present

## 2018-06-27 DIAGNOSIS — J301 Allergic rhinitis due to pollen: Secondary | ICD-10-CM | POA: Diagnosis not present

## 2018-07-01 DIAGNOSIS — J301 Allergic rhinitis due to pollen: Secondary | ICD-10-CM | POA: Diagnosis not present

## 2018-07-05 DIAGNOSIS — J301 Allergic rhinitis due to pollen: Secondary | ICD-10-CM | POA: Diagnosis not present

## 2018-07-08 DIAGNOSIS — J301 Allergic rhinitis due to pollen: Secondary | ICD-10-CM | POA: Diagnosis not present

## 2018-07-10 DIAGNOSIS — J301 Allergic rhinitis due to pollen: Secondary | ICD-10-CM | POA: Diagnosis not present

## 2018-07-12 DIAGNOSIS — J301 Allergic rhinitis due to pollen: Secondary | ICD-10-CM | POA: Diagnosis not present

## 2018-07-16 DIAGNOSIS — J301 Allergic rhinitis due to pollen: Secondary | ICD-10-CM | POA: Diagnosis not present

## 2018-07-19 DIAGNOSIS — J301 Allergic rhinitis due to pollen: Secondary | ICD-10-CM | POA: Diagnosis not present

## 2018-07-22 DIAGNOSIS — J301 Allergic rhinitis due to pollen: Secondary | ICD-10-CM | POA: Diagnosis not present

## 2018-07-26 DIAGNOSIS — J301 Allergic rhinitis due to pollen: Secondary | ICD-10-CM | POA: Diagnosis not present

## 2018-07-29 ENCOUNTER — Other Ambulatory Visit: Payer: Self-pay | Admitting: Family

## 2018-07-29 DIAGNOSIS — J301 Allergic rhinitis due to pollen: Secondary | ICD-10-CM | POA: Diagnosis not present

## 2018-07-29 DIAGNOSIS — Z0000 Encounter for general adult medical examination without abnormal findings: Secondary | ICD-10-CM

## 2018-07-29 DIAGNOSIS — R7303 Prediabetes: Secondary | ICD-10-CM

## 2018-08-02 DIAGNOSIS — J301 Allergic rhinitis due to pollen: Secondary | ICD-10-CM | POA: Diagnosis not present

## 2018-08-05 DIAGNOSIS — J301 Allergic rhinitis due to pollen: Secondary | ICD-10-CM | POA: Diagnosis not present

## 2018-08-11 ENCOUNTER — Other Ambulatory Visit: Payer: Self-pay | Admitting: Family

## 2018-08-12 ENCOUNTER — Encounter: Payer: Self-pay | Admitting: Family

## 2018-08-12 DIAGNOSIS — J301 Allergic rhinitis due to pollen: Secondary | ICD-10-CM | POA: Diagnosis not present

## 2018-08-12 MED ORDER — DESOGESTREL-ETHINYL ESTRADIOL 0.15-30 MG-MCG PO TABS: 1.0000 | ORAL_TABLET | Freq: Every day | ORAL | 0 refills | Status: DC

## 2018-08-12 NOTE — Telephone Encounter (Signed)
Last office visit 08/22/17 Next office visit 08/26/18 Previously prescribed by Dr Adriana Simas

## 2018-08-16 DIAGNOSIS — J301 Allergic rhinitis due to pollen: Secondary | ICD-10-CM | POA: Diagnosis not present

## 2018-08-19 DIAGNOSIS — R040 Epistaxis: Secondary | ICD-10-CM | POA: Diagnosis not present

## 2018-08-19 DIAGNOSIS — J301 Allergic rhinitis due to pollen: Secondary | ICD-10-CM | POA: Diagnosis not present

## 2018-08-19 DIAGNOSIS — J340 Abscess, furuncle and carbuncle of nose: Secondary | ICD-10-CM | POA: Diagnosis not present

## 2018-08-23 DIAGNOSIS — J301 Allergic rhinitis due to pollen: Secondary | ICD-10-CM | POA: Diagnosis not present

## 2018-08-26 ENCOUNTER — Ambulatory Visit (INDEPENDENT_AMBULATORY_CARE_PROVIDER_SITE_OTHER): Payer: 59 | Admitting: Family

## 2018-08-26 ENCOUNTER — Encounter: Payer: Self-pay | Admitting: Family

## 2018-08-26 VITALS — BP 118/90 | HR 88 | Temp 97.9°F | Resp 16 | Ht 66.0 in | Wt 266.5 lb

## 2018-08-26 DIAGNOSIS — F418 Other specified anxiety disorders: Secondary | ICD-10-CM

## 2018-08-26 DIAGNOSIS — R7303 Prediabetes: Secondary | ICD-10-CM | POA: Diagnosis not present

## 2018-08-26 DIAGNOSIS — J4520 Mild intermittent asthma, uncomplicated: Secondary | ICD-10-CM

## 2018-08-26 DIAGNOSIS — Z23 Encounter for immunization: Secondary | ICD-10-CM | POA: Diagnosis not present

## 2018-08-26 DIAGNOSIS — Z0000 Encounter for general adult medical examination without abnormal findings: Secondary | ICD-10-CM | POA: Diagnosis not present

## 2018-08-26 DIAGNOSIS — R0683 Snoring: Secondary | ICD-10-CM | POA: Diagnosis not present

## 2018-08-26 LAB — COMPREHENSIVE METABOLIC PANEL
ALT: 20 U/L (ref 0–35)
AST: 33 U/L (ref 0–37)
Albumin: 3.8 g/dL (ref 3.5–5.2)
Alkaline Phosphatase: 65 U/L (ref 39–117)
BUN: 9 mg/dL (ref 6–23)
CO2: 24 meq/L (ref 19–32)
CREATININE: 0.82 mg/dL (ref 0.40–1.20)
Calcium: 9.2 mg/dL (ref 8.4–10.5)
Chloride: 103 mEq/L (ref 96–112)
GFR: 90.92 mL/min (ref >60.00)
GLUCOSE: 153 mg/dL — AB (ref 70–99)
Potassium: 4 mEq/L (ref 3.5–5.1)
SODIUM: 138 meq/L (ref 135–145)
Total Bilirubin: 0.4 mg/dL (ref 0.2–1.2)
Total Protein: 7.4 g/dL (ref 6.0–8.3)

## 2018-08-26 LAB — LIPID PANEL
CHOL/HDL RATIO: 3
Cholesterol: 115 mg/dL (ref 0–200)
HDL: 42.8 mg/dL (ref >39.00)
NONHDL: 72.08
TRIGLYCERIDES: 245 mg/dL — AB (ref 0.0–149.0)
VLDL: 49 mg/dL — ABNORMAL HIGH (ref 0.0–40.0)

## 2018-08-26 LAB — CBC WITH DIFFERENTIAL/PLATELET
BASOS ABS: 0 10*3/uL (ref 0.0–0.1)
Basophils Relative: 0.6 % (ref 0.0–3.0)
EOS ABS: 0.3 10*3/uL (ref 0.0–0.7)
Eosinophils Relative: 3.6 % (ref 0.0–5.0)
HCT: 39.8 % (ref 36.0–46.0)
Hemoglobin: 13.3 g/dL (ref 12.0–15.0)
LYMPHS ABS: 2 10*3/uL (ref 0.7–4.0)
Lymphocytes Relative: 25.2 % (ref 12.0–46.0)
MCHC: 33.4 g/dL (ref 30.0–36.0)
MCV: 88.4 fl (ref 78.0–100.0)
Monocytes Absolute: 0.4 10*3/uL (ref 0.1–1.0)
Monocytes Relative: 5.6 % (ref 3.0–12.0)
NEUTROS PCT: 65 % (ref 43.0–77.0)
Neutro Abs: 5.1 10*3/uL (ref 1.4–7.7)
PLATELETS: 305 10*3/uL (ref 150.0–400.0)
RBC: 4.5 Mil/uL (ref 3.87–5.11)
RDW: 14.5 % (ref 11.5–15.5)
WBC: 7.8 10*3/uL (ref 4.0–10.5)

## 2018-08-26 LAB — VITAMIN D 25 HYDROXY (VIT D DEFICIENCY, FRACTURES): VITD: 28.79 ng/mL — ABNORMAL LOW (ref 30.00–100.00)

## 2018-08-26 LAB — TSH: TSH: 1.86 u[IU]/mL (ref 0.35–4.50)

## 2018-08-26 LAB — HEMOGLOBIN A1C: Hgb A1c MFr Bld: 7.6 % — ABNORMAL HIGH (ref 4.6–6.5)

## 2018-08-26 LAB — LDL CHOLESTEROL, DIRECT: LDL DIRECT: 53 mg/dL

## 2018-08-26 MED ORDER — MONTELUKAST SODIUM 10 MG PO TABS: 10.0000 mg | ORAL_TABLET | Freq: Every day | ORAL | 3 refills | Status: DC

## 2018-08-26 MED ORDER — METFORMIN HCL ER 500 MG PO TB24: ORAL_TABLET | ORAL | 1 refills | Status: DC

## 2018-08-26 NOTE — Assessment & Plan Note (Signed)
Clinical breast exam performed today.  Deferred pelvic exam in the absence of complaints, Pap smear is up-to-date. 

## 2018-08-26 NOTE — Assessment & Plan Note (Signed)
Pending A1c.  We will decide at that juncture, if patient needs different agent other than the metformin.

## 2018-08-26 NOTE — Assessment & Plan Note (Signed)
Stable. Requested refills today.she will continue to follow with allergist

## 2018-08-26 NOTE — Assessment & Plan Note (Signed)
Pending sleep study

## 2018-08-26 NOTE — Patient Instructions (Addendum)
Wean off lexapro, take 73m every other day for one week; and then the following take every 3rd day for one week, and then stop.   Make a nurse visit for tetanus ( Tdap) vaccine.   Let me age of grandmother with colon cancer.   Today we discussed referrals, orders. Sleep study   I have placed these orders in the system for you.  Please be sure to give [facility] call if you have not heard from our office regarding this. We should hear from [facility] ONE week with information regarding your appointment. If not, please let me know immediately.      Health Maintenance, Female Adopting a healthy lifestyle and getting preventive care can go a long way to promote health and wellness. Talk with your health care provider about what schedule of regular examinations is right for you. This is a good chance for you to check in with your provider about disease prevention and staying healthy. In between checkups, there are plenty of things you can do on your own. Experts have done a lot of research about which lifestyle changes and preventive measures are most likely to keep you healthy. Ask your health care provider for more information. Weight and diet Eat a healthy diet  Be sure to include plenty of vegetables, fruits, low-fat dairy products, and lean protein.  Do not eat a lot of foods high in solid fats, added sugars, or salt.  Get regular exercise. This is one of the most important things you can do for your health. ? Most adults should exercise for at least 150 minutes each week. The exercise should increase your heart rate and make you sweat (moderate-intensity exercise). ? Most adults should also do strengthening exercises at least twice a week. This is in addition to the moderate-intensity exercise.  Maintain a healthy weight  Body mass index (BMI) is a measurement that can be used to identify possible weight problems. It estimates body fat based on height and weight. Your health care provider can  help determine your BMI and help you achieve or maintain a healthy weight.  For females 213years of age and older: ? A BMI below 18.5 is considered underweight. ? A BMI of 18.5 to 24.9 is normal. ? A BMI of 25 to 29.9 is considered overweight. ? A BMI of 30 and above is considered obese.  Watch levels of cholesterol and blood lipids  You should start having your blood tested for lipids and cholesterol at 24years of age, then have this test every 5 years.  You may need to have your cholesterol levels checked more often if: ? Your lipid or cholesterol levels are high. ? You are older than 24years of age. ? You are at high risk for heart disease.  Cancer screening Lung Cancer  Lung cancer screening is recommended for adults 510873years old who are at high risk for lung cancer because of a history of smoking.  A yearly low-dose CT scan of the lungs is recommended for people who: ? Currently smoke. ? Have quit within the past 15 years. ? Have at least a 30-pack-year history of smoking. A pack year is smoking an average of one pack of cigarettes a day for 1 year.  Yearly screening should continue until it has been 15 years since you quit.  Yearly screening should stop if you develop a health problem that would prevent you from having lung cancer treatment.  Breast Cancer  Practice breast  self-awareness. This means understanding how your breasts normally appear and feel.  It also means doing regular breast self-exams. Let your health care provider know about any changes, no matter how small.  If you are in your 20s or 30s, you should have a clinical breast exam (CBE) by a health care provider every 1-3 years as part of a regular health exam.  If you are 17 or older, have a CBE every year. Also consider having a breast X-ray (mammogram) every year.  If you have a family history of breast cancer, talk to your health care provider about genetic screening.  If you are at high risk  for breast cancer, talk to your health care provider about having an MRI and a mammogram every year.  Breast cancer gene (BRCA) assessment is recommended for women who have family members with BRCA-related cancers. BRCA-related cancers include: ? Breast. ? Ovarian. ? Tubal. ? Peritoneal cancers.  Results of the assessment will determine the need for genetic counseling and BRCA1 and BRCA2 testing.  Cervical Cancer Your health care provider may recommend that you be screened regularly for cancer of the pelvic organs (ovaries, uterus, and vagina). This screening involves a pelvic examination, including checking for microscopic changes to the surface of your cervix (Pap test). You may be encouraged to have this screening done every 3 years, beginning at age 60.  For women ages 80-65, health care providers may recommend pelvic exams and Pap testing every 3 years, or they may recommend the Pap and pelvic exam, combined with testing for human papilloma virus (HPV), every 5 years. Some types of HPV increase your risk of cervical cancer. Testing for HPV may also be done on women of any age with unclear Pap test results.  Other health care providers may not recommend any screening for nonpregnant women who are considered low risk for pelvic cancer and who do not have symptoms. Ask your health care provider if a screening pelvic exam is right for you.  If you have had past treatment for cervical cancer or a condition that could lead to cancer, you need Pap tests and screening for cancer for at least 20 years after your treatment. If Pap tests have been discontinued, your risk factors (such as having a new sexual partner) need to be reassessed to determine if screening should resume. Some women have medical problems that increase the chance of getting cervical cancer. In these cases, your health care provider may recommend more frequent screening and Pap tests.  Colorectal Cancer  This type of cancer can be  detected and often prevented.  Routine colorectal cancer screening usually begins at 24 years of age and continues through 24 years of age.  Your health care provider may recommend screening at an earlier age if you have risk factors for colon cancer.  Your health care provider may also recommend using home test kits to check for hidden blood in the stool.  A small camera at the end of a tube can be used to examine your colon directly (sigmoidoscopy or colonoscopy). This is done to check for the earliest forms of colorectal cancer.  Routine screening usually begins at age 42.  Direct examination of the colon should be repeated every 5-10 years through 24 years of age. However, you may need to be screened more often if early forms of precancerous polyps or small growths are found.  Skin Cancer  Check your skin from head to toe regularly.  Tell your health care provider about  any new moles or changes in moles, especially if there is a change in a mole's shape or color.  Also tell your health care provider if you have a mole that is larger than the size of a pencil eraser.  Always use sunscreen. Apply sunscreen liberally and repeatedly throughout the day.  Protect yourself by wearing long sleeves, pants, a wide-brimmed hat, and sunglasses whenever you are outside.  Heart disease, diabetes, and high blood pressure  High blood pressure causes heart disease and increases the risk of stroke. High blood pressure is more likely to develop in: ? People who have blood pressure in the high end of the normal range (130-139/85-89 mm Hg). ? People who are overweight or obese. ? People who are African American.  If you are 42-84 years of age, have your blood pressure checked every 3-5 years. If you are 23 years of age or older, have your blood pressure checked every year. You should have your blood pressure measured twice-once when you are at a hospital or clinic, and once when you are not at a  hospital or clinic. Record the average of the two measurements. To check your blood pressure when you are not at a hospital or clinic, you can use: ? An automated blood pressure machine at a pharmacy. ? A home blood pressure monitor.  If you are between 78 years and 54 years old, ask your health care provider if you should take aspirin to prevent strokes.  Have regular diabetes screenings. This involves taking a blood sample to check your fasting blood sugar level. ? If you are at a normal weight and have a low risk for diabetes, have this test once every three years after 24 years of age. ? If you are overweight and have a high risk for diabetes, consider being tested at a younger age or more often. Preventing infection Hepatitis B  If you have a higher risk for hepatitis B, you should be screened for this virus. You are considered at high risk for hepatitis B if: ? You were born in a country where hepatitis B is common. Ask your health care provider which countries are considered high risk. ? Your parents were born in a high-risk country, and you have not been immunized against hepatitis B (hepatitis B vaccine). ? You have HIV or AIDS. ? You use needles to inject street drugs. ? You live with someone who has hepatitis B. ? You have had sex with someone who has hepatitis B. ? You get hemodialysis treatment. ? You take certain medicines for conditions, including cancer, organ transplantation, and autoimmune conditions.  Hepatitis C  Blood testing is recommended for: ? Everyone born from [birth year] through 1967. ? Anyone with known risk factors for hepatitis C.  Sexually transmitted infections (STIs)  You should be screened for sexually transmitted infections (STIs) including gonorrhea and chlamydia if: ? You are sexually active and are younger than 24 years of age. ? You are older than 24 years of age and your health care provider tells you that you are at risk for this type of  infection. ? Your sexual activity has changed since you were last screened and you are at an increased risk for chlamydia or gonorrhea. Ask your health care provider if you are at risk.  If you do not have HIV, but are at risk, it may be recommended that you take a prescription medicine daily to prevent HIV infection. This is called pre-exposure prophylaxis (PrEP). You are considered  at risk if: ? You are sexually active and do not regularly use condoms or know the HIV status of your partner(s). ? You take drugs by injection. ? You are sexually active with a partner who has HIV.  Talk with your health care provider about whether you are at high risk of being infected with HIV. If you choose to begin PrEP, you should first be tested for HIV. You should then be tested every 3 months for as long as you are taking PrEP. Pregnancy  If you are premenopausal and you may become pregnant, ask your health care provider about preconception counseling.  If you may become pregnant, take 400 to 800 micrograms (mcg) of folic acid every day.  If you want to prevent pregnancy, talk to your health care provider about birth control (contraception). Osteoporosis and menopause  Osteoporosis is a disease in which the bones lose minerals and strength with aging. This can result in serious bone fractures. Your risk for osteoporosis can be identified using a bone density scan.  If you are 44 years of age or older, or if you are at risk for osteoporosis and fractures, ask your health care provider if you should be screened.  Ask your health care provider whether you should take a calcium or vitamin D supplement to lower your risk for osteoporosis.  Menopause may have certain physical symptoms and risks.  Hormone replacement therapy may reduce some of these symptoms and risks. Talk to your health care provider about whether hormone replacement therapy is right for you. Follow these instructions at home:  Schedule  regular health, dental, and eye exams.  Stay current with your immunizations.  Do not use any tobacco products including cigarettes, chewing tobacco, or electronic cigarettes.  If you are pregnant, do not drink alcohol.  If you are breastfeeding, limit how much and how often you drink alcohol.  Limit alcohol intake to no more than 1 drink per day for nonpregnant women. One drink equals 12 ounces of beer, 5 ounces of wine, or 1 ounces of hard liquor.  Do not use street drugs.  Do not share needles.  Ask your health care provider for help if you need support or information about quitting drugs.  Tell your health care provider if you often feel depressed.  Tell your health care provider if you have ever been abused or do not feel safe at home. This information is not intended to replace advice given to you by your health care provider. Make sure you discuss any questions you have with your health care provider. Document Released: 04/24/2011 Document Revised: 03/16/2016 Document Reviewed: 07/13/2015 Elsevier Interactive Patient Education  [employer].

## 2018-08-26 NOTE — Progress Notes (Signed)
Subjective:    [name], female    DOB: [birth year], 22 y.o.   MRN: [id number]  CC: Amy Olson is a 22 y.o. female who presents today for physical exam.    HPI: Feels well today.    Notes concern for sleep apnea, mom noticed that she stopped breathing. No HA.   Prediabetes- metformin causes diarhea and has not improved with XR version.   depression- doing well. Would like to come off of the lexapro. Not depressed. No si/hi.   Asthma -symptoms improved at this time.  She is on allergy shots now. Would  Like refills of Singulair, Qvar.      Colorectal Cancer Screening:  No early family history Breast Cancer Screening: No first degree relative. Parents no polyps to her knowledge.  Cervical Cancer Screening: UTD        Tetanus - due      Labs: Screening labs today. Exercise: Gets regular exercise.  Alcohol use: occasional Smoking/tobacco use: Nonsmoker.  Regular dental exams: UTD Wears seat belt: Yes.  HISTORY:  Past Medical History:  Diagnosis Date  . allergies   . Allergy   . Asthma   . Depression   . Diabetes mellitus without complication (HCC)    prediabetic    Past Surgical History:  Procedure Laterality Date  . ADENOIDECTOMY     1998- only adenoids   Family History  Problem Relation Age of Onset  . Rheum arthritis Maternal Grandmother   . Hyperlipidemia Maternal Grandmother   . Hypertension Maternal Grandmother   . Arthritis Maternal Grandmother   . Hyperlipidemia Mother   . Diabetes Brother   . Hyperlipidemia Maternal Grandfather   . Hypertension Maternal Grandfather   . Colon cancer Paternal Grandmother        age unknown  . Breast cancer Neg Hx       ALLERGIES: Augmentin [amoxicillin-pot clavulanate]  Current Outpatient Medications on File Prior to Visit  Medication Sig Dispense Refill  . albuterol (PROAIR HFA) 108 (90 BASE) MCG/ACT inhaler Inhale 2 puffs into the lungs every 6 (six) hours as needed for wheezing or  shortness of breath. 1 Inhaler 1  . beclomethasone (QVAR) 80 MCG/ACT inhaler Inhale 80 mcg into the lungs.    Marland Kitchen desogestrel-ethinyl estradiol (APRI) 0.15-30 MG-MCG tablet Take 1 tablet by mouth daily. 84 tablet 0  . EPINEPHrine 0.3 mg/0.3 mL IJ SOAJ injection epinephrine 0.3 mg/0.3 mL injection, auto-injector  INJECT INTRAMUSCULARLY AS DIRECTED    . escitalopram (LEXAPRO) 10 MG tablet TAKE 1 TABLET(10 MG) BY MOUTH DAILY 90 tablet 1  . QVAR 80 MCG/ACT inhaler TAKE 2 PUFFS FIRST THING IN THE MORNING AND THEN  ANOTHER 2 PUFFS ABOUT 12  HOURS LATER. 3 Inhaler 0  . gentamicin ointment (GARAMYCIN) 0.1 % APP TO NOSE TID  15   No current facility-administered medications on file prior to visit.     Social History   Tobacco Use  . Smoking status: Never Smoker  . Smokeless tobacco: Never Used  Substance Use Topics  . Alcohol use: Yes    Alcohol/week: 0.0 standard drinks    Comment: rare  . Drug use: No    Review of Systems  Constitutional: Negative for chills, fever and unexpected weight change.  HENT: Negative for congestion.   Respiratory: Negative for cough, shortness of breath and wheezing.   Cardiovascular: Negative for chest pain, palpitations and leg swelling.  Gastrointestinal: Negative for nausea and vomiting.  Genitourinary: Negative for vaginal pain.  Musculoskeletal: Negative for arthralgias and myalgias.  Skin: Negative for rash.  Neurological: Negative for headaches.  Hematological: Negative for adenopathy.  Psychiatric/Behavioral: Negative for confusion, sleep disturbance and suicidal ideas.      Objective:    BP 118/90 (BP Location: Left Arm, Patient Position: Sitting, Cuff Size: Large)   Pulse 88   Temp 97.9 F (36.6 C) (Oral)   Resp 16   Ht 5\' 6"  (1.676 m)   Wt 266 lb 8 oz (120.9 kg)   LMP 06/26/2018   SpO2 97%   BMI 43.01 kg/m   BP Readings from Last 3 Encounters:  08/26/18 118/90  08/22/17 126/76  07/05/17 110/90   Wt Readings from Last 3 Encounters:   08/26/18 266 lb 8 oz (120.9 kg)  08/22/17 267 lb 12.8 oz (121.5 kg)  07/05/17 270 lb (122.5 kg)    Physical Exam  Constitutional: She appears well-developed and well-nourished.  Eyes: Conjunctivae are normal.  Neck: No thyroid mass and no thyromegaly present.  Cardiovascular: Normal rate, regular rhythm, normal heart sounds and normal pulses.  Pulmonary/Chest: Effort normal and breath sounds normal. She has no wheezes. She has no rhonchi. She has no rales. Right breast exhibits no inverted nipple, no mass, no nipple discharge, no skin change and no tenderness. Left breast exhibits no inverted nipple, no mass, no nipple discharge, no skin change and no tenderness. Breasts are symmetrical.  CBE performed.   Lymphadenopathy:       Head (right side): No submental, no submandibular, no tonsillar, no preauricular, no posterior auricular and no occipital adenopathy present.       Head (left side): No submental, no submandibular, no tonsillar, no preauricular, no posterior auricular and no occipital adenopathy present.    She has no cervical adenopathy.       Right cervical: No superficial cervical, no deep cervical and no posterior cervical adenopathy present.      Left cervical: No superficial cervical, no deep cervical and no posterior cervical adenopathy present.    She has no axillary adenopathy.  Neurological: She is alert.  Skin: Skin is warm and dry.  Psychiatric: She has a normal mood and affect. Her speech is normal and behavior is normal. Thought content normal.  Vitals reviewed.      Assessment & Plan:   Problem List Items Addressed This Visit      Respiratory   Extrinsic asthma    Stable. Requested refills today.she will continue to follow with allergist      Relevant Medications   montelukast (SINGULAIR) 10 MG tablet   beclomethasone (QVAR) 80 MCG/ACT inhaler     Other   Routine physical examination - Primary    Clinical breast exam performed today.  Deferred pelvic  exam in the absence of complaints, Pap smear is up-to-date.      Relevant Medications   montelukast (SINGULAIR) 10 MG tablet   metFORMIN (GLUCOPHAGE-XR) 500 MG 24 hr tablet   Other Relevant Orders   CBC with Differential/Platelet   Comprehensive metabolic panel   Hemoglobin A1c   Lipid panel   TSH   VITAMIN D 25 Hydroxy (Vit-D Deficiency, Fractures)   Ambulatory referral to Sleep Studies   Depression with anxiety    Improved.  Patient desires to come off Lexapro.  Given her information how to wean from this medication.  Advised that she will let me know if it turns out that she needs this medication.      Prediabetes    Pending A1c.  We will decide at that juncture, if patient needs different agent other than the metformin.      Relevant Medications   metFORMIN (GLUCOPHAGE-XR) 500 MG 24 hr tablet   Snores    Pending sleep study.       Other Visit Diagnoses    Need for immunization against influenza       Relevant Orders   Flu Vaccine QUAD 36+ mos IM (Completed)       I am having Marvis Repress maintain her albuterol, QVAR, escitalopram, desogestrel-ethinyl estradiol, EPINEPHrine, montelukast, metFORMIN, beclomethasone, and gentamicin ointment.   Meds ordered this encounter  Medications  . montelukast (SINGULAIR) 10 MG tablet    Sig: Take 1 tablet (10 mg total) by mouth at bedtime.    Dispense:  90 tablet    Refill:  3  . metFORMIN (GLUCOPHAGE-XR) 500 MG 24 hr tablet    Sig: TAKE 2 TABLETS(1000 MG) BY MOUTH DAILY    Dispense:  180 tablet    Refill:  1    Return precautions given.   Risks, benefits, and alternatives of the medications and treatment plan prescribed today were discussed, and patient expressed understanding.   Education regarding symptom management and diagnosis given to patient on AVS.   Continue to follow with Allegra Grana, FNP for routine health maintenance.   Marvis Repress and I agreed with plan.   Rennie Plowman, FNP

## 2018-08-26 NOTE — Assessment & Plan Note (Signed)
Improved.  Patient desires to come off Lexapro.  Given her information how to wean from this medication.  Advised that she will let me know if it turns out that she needs this medication.

## 2018-08-27 DIAGNOSIS — J301 Allergic rhinitis due to pollen: Secondary | ICD-10-CM | POA: Diagnosis not present

## 2018-08-28 ENCOUNTER — Other Ambulatory Visit: Payer: Self-pay | Admitting: Family

## 2018-08-28 ENCOUNTER — Encounter: Payer: Self-pay | Admitting: Family

## 2018-08-28 DIAGNOSIS — R7303 Prediabetes: Secondary | ICD-10-CM

## 2018-08-28 DIAGNOSIS — J4520 Mild intermittent asthma, uncomplicated: Secondary | ICD-10-CM

## 2018-08-28 DIAGNOSIS — Z0000 Encounter for general adult medical examination without abnormal findings: Secondary | ICD-10-CM

## 2018-08-28 MED ORDER — METFORMIN HCL ER (MOD) 1000 MG PO TB24: 2000.0000 mg | ORAL_TABLET | Freq: Every evening | ORAL | 1 refills | Status: DC

## 2018-08-30 DIAGNOSIS — J301 Allergic rhinitis due to pollen: Secondary | ICD-10-CM | POA: Diagnosis not present

## 2018-09-04 ENCOUNTER — Telehealth: Payer: Self-pay | Admitting: Family

## 2018-09-04 NOTE — Telephone Encounter (Signed)
Call pharmacy  Which metformin will they cover?

## 2018-09-05 ENCOUNTER — Ambulatory Visit (INDEPENDENT_AMBULATORY_CARE_PROVIDER_SITE_OTHER): Payer: 59 | Admitting: *Deleted

## 2018-09-05 DIAGNOSIS — Z23 Encounter for immunization: Secondary | ICD-10-CM | POA: Diagnosis not present

## 2018-09-06 NOTE — Telephone Encounter (Signed)
Left message for patient , ok for [facility] to speak with patient  Please advise patient working on prior authorization and changing to medication they will cover  Insurance will not cover glucophage XR 4 tablets daily  . They won't cover metformin XR due to it goes in as [employer].

## 2018-09-09 DIAGNOSIS — J301 Allergic rhinitis due to pollen: Secondary | ICD-10-CM | POA: Diagnosis not present

## 2018-09-09 MED ORDER — METFORMIN HCL 1000 MG PO TABS: 1000.0000 mg | ORAL_TABLET | Freq: Two times a day (BID) | ORAL | 1 refills | Status: DC

## 2018-09-09 NOTE — Telephone Encounter (Signed)
Call pt  I sent in glucophage which is metformin . This should be a generic ; she will take 1000mg  BID with meals  Please ensure covered by insurance

## 2018-09-09 NOTE — Telephone Encounter (Signed)
Spoke with patient and advised her of below.

## 2018-09-13 DIAGNOSIS — J301 Allergic rhinitis due to pollen: Secondary | ICD-10-CM | POA: Diagnosis not present

## 2018-09-16 DIAGNOSIS — J301 Allergic rhinitis due to pollen: Secondary | ICD-10-CM | POA: Diagnosis not present

## 2018-09-23 DIAGNOSIS — J301 Allergic rhinitis due to pollen: Secondary | ICD-10-CM | POA: Diagnosis not present

## 2018-09-27 DIAGNOSIS — J301 Allergic rhinitis due to pollen: Secondary | ICD-10-CM | POA: Diagnosis not present

## 2018-09-30 DIAGNOSIS — J301 Allergic rhinitis due to pollen: Secondary | ICD-10-CM | POA: Diagnosis not present

## 2018-10-04 DIAGNOSIS — J301 Allergic rhinitis due to pollen: Secondary | ICD-10-CM | POA: Diagnosis not present

## 2018-10-07 DIAGNOSIS — J301 Allergic rhinitis due to pollen: Secondary | ICD-10-CM | POA: Diagnosis not present

## 2018-10-11 DIAGNOSIS — J301 Allergic rhinitis due to pollen: Secondary | ICD-10-CM | POA: Diagnosis not present

## 2018-10-25 DIAGNOSIS — J301 Allergic rhinitis due to pollen: Secondary | ICD-10-CM | POA: Diagnosis not present

## 2018-10-28 ENCOUNTER — Encounter: Payer: Self-pay | Admitting: Neurology

## 2018-10-28 ENCOUNTER — Ambulatory Visit: Payer: 59 | Admitting: Neurology

## 2018-10-28 VITALS — BP 132/92 | HR 88 | Ht 66.0 in | Wt 267.0 lb

## 2018-10-28 DIAGNOSIS — R351 Nocturia: Secondary | ICD-10-CM

## 2018-10-28 DIAGNOSIS — R0681 Apnea, not elsewhere classified: Secondary | ICD-10-CM | POA: Diagnosis not present

## 2018-10-28 DIAGNOSIS — G4719 Other hypersomnia: Secondary | ICD-10-CM | POA: Diagnosis not present

## 2018-10-28 DIAGNOSIS — G2581 Restless legs syndrome: Secondary | ICD-10-CM

## 2018-10-28 DIAGNOSIS — J45909 Unspecified asthma, uncomplicated: Secondary | ICD-10-CM

## 2018-10-28 DIAGNOSIS — R0683 Snoring: Secondary | ICD-10-CM | POA: Diagnosis not present

## 2018-10-28 NOTE — Patient Instructions (Signed)

## 2018-10-28 NOTE — Progress Notes (Signed)
Subjective:    [name] is a 23 y.o. female.  HPI     Huston FoleySaima Kynslei Art, MD, PhD [facility] [address], [address] [address] [place], [place] [address]  Dear Amy CheMargaret,  I saw your patient, Amy Olson, upon your kind request in my sleep clinic today for initial consultation of her sleep disorder, in particular, concern for underlying obstructive sleep apnea. The patient is unaccompanied today. As you know, Amy Olson is a 23 year old right-handed woman with an underlying medical history diabetes, asthma, allergies, depression, and morbid obesity with BMI of over 40, who reports snoring and excessive daytime somnolence as well as witnessed apneas. I reviewed your office note from 08/26/2018. Her Epworth sleepiness score is 12 out of 24 today, fatigue score is 31 out of 63. His she is single and lives alone, no children. She is a nonsmoker and drinks alcohol occasionally, drinks caffeine in the form of soda and tea, daily.  She works in [employer] and her sleep schedule varies depending on her work schedule but generally she is in bed by 1 AM and rise time is around 11. She reports that she requires about 10 hours of sleep to be fully rested. She gained weight while in college, weight has been stable since then. She has occasional restless leg symptoms affecting the right leg more than left. She had an adenoidectomy as a toddler. Her maternal grandmother has sleep apnea and uses a CPAP machine. She denies morning headaches but has nocturia about twice per average night. Her mom has noticed pauses and gasping sounds while she is asleep. She has had night sweats and feels hot at night, sleeps with a ceiling fan on. She was diagnosed with asthma when she was in college.  Her Past Medical History Is Significant For: Past Medical History:  Diagnosis Date  . allergies   . Allergy   . Asthma   . Depression   . Diabetes mellitus without complication (HCC)    prediabetic     Her Past Surgical History Is Significant For: Past Surgical History:  Procedure Laterality Date  . ADENOIDECTOMY     1998- only adenoids    Her Family History Is Significant For: Family History  Problem Relation Age of Onset  . Rheum arthritis Maternal Grandmother   . Hyperlipidemia Maternal Grandmother   . Hypertension Maternal Grandmother   . Arthritis Maternal Grandmother   . Hyperlipidemia Mother   . Diabetes Brother   . Hyperlipidemia Maternal Grandfather   . Hypertension Maternal Grandfather   . Colon cancer Paternal Grandmother [id number]  . Breast cancer Neg Hx     Her Social History Is Significant For: Social History   Socioeconomic History  . Marital status: Single    Spouse name: Not on file  . Number of children: Not on file  . Years of education: Not on file  . Highest education level: Not on file  Occupational History  . Occupation: school  Social Needs  . Financial resource strain: Not on file  . Food insecurity:    Worry: Not on file    Inability: Not on file  . Transportation needs:    Medical: Not on file    Non-medical: Not on file  Tobacco Use  . Smoking status: Olson Smoker  . Smokeless tobacco: Olson Used  Substance and Sexual Activity  . Alcohol use: Yes    Alcohol/week: 0.0 standard drinks    Comment: rare  . Drug use: No  .  Sexual activity: Not Currently  Lifestyle  . Physical activity:    Days per week: Not on file    Minutes per session: Not on file  . Stress: Not on file  Relationships  . Social connections:    Talks on phone: Not on file    Gets together: Not on file    Attends religious service: Not on file    Active member of club or organization: Not on file    Attends meetings of clubs or organizations: Not on file    Relationship status: Not on file  Other Topics Concern  . Not on file  Social History Narrative   Single   College- Student [employer]    Caffeine- no soda, 1 cup tea, rare coffee    Her Allergies Are:   Allergies  Allergen Reactions  . Augmentin [Amoxicillin-Pot Clavulanate] Nausea Only  :   Her Current Medications Are:  Outpatient Encounter Medications as of 10/28/2018  Medication Sig  . albuterol (PROAIR HFA) 108 (90 BASE) MCG/ACT inhaler Inhale 2 puffs into the lungs every 6 (six) hours as needed for wheezing or shortness of breath.  . desogestrel-ethinyl estradiol (APRI) 0.15-30 MG-MCG tablet Take 1 tablet by mouth daily.  Marland Kitchen EPINEPHrine 0.3 mg/0.3 mL IJ SOAJ injection epinephrine 0.3 mg/0.3 mL injection, auto-injector  INJECT INTRAMUSCULARLY AS DIRECTED  . escitalopram (LEXAPRO) 10 MG tablet TAKE 1 TABLET(10 MG) BY MOUTH DAILY  . gentamicin ointment (GARAMYCIN) 0.1 % APP TO NOSE TID  . metFORMIN (GLUCOPHAGE) 1000 MG tablet Take 1 tablet (1,000 mg total) by mouth 2 (two) times daily with a meal.  . montelukast (SINGULAIR) 10 MG tablet Take 1 tablet (10 mg total) by mouth at bedtime.  Marland Kitchen QVAR 80 MCG/ACT inhaler TAKE 2 PUFFS FIRST THING IN THE MORNING AND THEN  ANOTHER 2 PUFFS ABOUT 12  HOURS LATER.  . [DISCONTINUED] beclomethasone (QVAR) 80 MCG/ACT inhaler Inhale 80 mcg into the lungs.  . [DISCONTINUED] montelukast (SINGULAIR) 10 MG tablet TAKE 1 TABLET(10 MG) BY MOUTH AT BEDTIME   No facility-administered encounter medications on file as of 10/28/2018.   :  Review of Systems:  Out of a complete 14 point review of systems, all are reviewed and negative with the exception of these symptoms as listed below: Review of Systems  Neurological:       Pt presents today to discuss her sleep. She has Olson had a sleep study and does not know if she snores. Pt wakes up during the night because she sweats profusely.  Epworth Sleepiness Scale 0= would Olson doze 1= slight chance of dozing 2= moderate chance of dozing 3= high chance of dozing  Sitting and reading: 1 Watching TV: 2 Sitting inactive in a public place (ex. Theater or meeting): 0 As a passenger in a car for an hour without a  break: 3 Lying down to rest in the afternoon: 3 Sitting and talking to someone: 0 Sitting quietly after lunch (no alcohol): 3 In a car, while stopped in traffic: 0 Total: 12     Objective:  Neurological Exam  Physical Exam Physical Examination:   Vitals:   10/28/18 0853  BP: (!) 183/129  Pulse: 88    General Examination: The patient is a very pleasant 23 y.o. female in no acute distress. She appears well-developed and well-nourished and well groomed.   HEENT: Normocephalic, atraumatic, pupils are equal, round and reactive to light and accommodation. Extraocular tracking is good without limitation to gaze excursion or nystagmus noted. Normal  smooth pursuit is noted. Hearing is grossly intact. Face is symmetric with normal facial animation and normal facial sensation. Speech is clear with no dysarthria noted. There is no hypophonia. There is no lip, neck/head, jaw or voice tremor. Neck is supple with full range of passive and active motion. There are no carotid bruits on auscultation. Oropharynx exam reveals: mild mouth dryness, good dental hygiene and mild airway crowding, due to smaller airway entry and tonsils in place about 1-2+ bilaterally. Mallampati is class II. Neck circumference is 16-1/4 inches. She has a minimal to no overbite  Chest: Clear to auscultation without wheezing, rhonchi or crackles noted.  Heart: S1+S2+0, regular and normal without murmurs, rubs or gallops noted.   Abdomen: Soft, non-tender and non-distended with normal bowel sounds appreciated on auscultation.  Extremities: There is no pitting edema in the distal lower extremities bilaterally. Pedal pulses are intact.  Skin: Warm and dry without trophic changes noted.  Musculoskeletal: exam reveals no obvious joint deformities, tenderness or joint swelling or erythema.   Neurologically:  Mental status: The patient is awake, alert and oriented in all 4 spheres. Her immediate and remote memory, attention,  language skills and fund of knowledge are appropriate. There is no evidence of aphasia, agnosia, apraxia or anomia. Speech is clear with normal prosody and enunciation. Thought process is linear. Mood is normal and affect is normal.  Cranial nerves II - XII are as described above under HEENT exam. In addition: shoulder shrug is normal with equal shoulder height noted. Motor exam: Normal bulk, strength and tone is noted. There is no drift, tremor or rebound. Romberg is negative. Fine motor skills and coordination: grossly intact.  Cerebellar testing: No dysmetria or intention tremor on finger to nose testing. Heel to shin is unremarkable bilaterally. There is no truncal or gait ataxia.  Sensory exam: intact to light touch in the upper and lower extremities.  Gait, station and balance: She stands easily. No veering to one side is noted. No leaning to one side is noted. Posture is age-appropriate and stance is narrow based. Gait shows normal stride length and normal pace. No problems turning are noted. Tandem walk is unremarkable.   Assessment and Plan:    In summary, Amy Olson is a very pleasant 23 y.o.-year old female with a history and physical exam concerning for obstructive sleep apnea (OSA). I had a long chat with the patient about my findings and the diagnosis of OSA, its prognosis and treatment options. We talked about medical treatments, surgical interventions and non-pharmacological approaches. I explained in particular the risks and ramifications of untreated moderate to severe OSA, especially with respect to developing cardiovascular disease down the Road, including congestive heart failure, difficult to treat hypertension, cardiac arrhythmias, or stroke. Even type 2 diabetes has, in part, been linked to untreated OSA. Symptoms of untreated OSA include daytime sleepiness, memory problems, mood irritability and mood disorder such as depression and anxiety, lack of energy, as well as recurrent  headaches, especially morning headaches. We talked about trying to maintain a healthy lifestyle in general, as well as the importance of weight control. I encouraged the patient to eat healthy, exercise daily and keep well hydrated, to keep a scheduled bedtime and wake time routine, to not skip any meals and eat healthy snacks in between meals. I advised the patient not to drive when feeling sleepy. I recommended the following at this time: sleep study with potential positive airway pressure titration. (We will score hypopneas at 4%).  I explained the sleep test procedure to the patient and also outlined possible surgical and non-surgical treatment options of OSA, including the use of a custom-made dental device (which would require a referral to a specialist dentist or oral surgeon), upper airway surgical options, such as pillar implants, radiofrequency surgery, tongue base surgery, and UPPP (which would involve a referral to an ENT surgeon). Rarely, jaw surgery such as mandibular advancement may be considered.  I also explained the CPAP treatment option to the patient, who indicated that she would be willing to try CPAP if the need arises. I explained the importance of being compliant with PAP treatment, not only for insurance purposes but primarily to improve Her symptoms, and for the patient's long term health benefit, including to reduce Her cardiovascular risks. I answered all her questions today and the patient was in agreement. I plan to see her back after the sleep study is completed and encouraged her to call with any interim questions, concerns, problems or updates.   Thank you very much for allowing me to participate in the care of this nice patient. If I can be of any further assistance to you please do not hesitate to call me at [phone].  Sincerely,   Huston Foley, MD, PhD

## 2018-10-29 DIAGNOSIS — J301 Allergic rhinitis due to pollen: Secondary | ICD-10-CM | POA: Diagnosis not present

## 2018-11-04 ENCOUNTER — Ambulatory Visit (INDEPENDENT_AMBULATORY_CARE_PROVIDER_SITE_OTHER): Payer: Self-pay

## 2018-11-04 ENCOUNTER — Encounter (INDEPENDENT_AMBULATORY_CARE_PROVIDER_SITE_OTHER): Payer: Self-pay | Admitting: Orthopedic Surgery

## 2018-11-04 ENCOUNTER — Ambulatory Visit (INDEPENDENT_AMBULATORY_CARE_PROVIDER_SITE_OTHER): Payer: 59 | Admitting: Orthopedic Surgery

## 2018-11-04 VITALS — Ht 66.0 in | Wt 263.0 lb

## 2018-11-04 DIAGNOSIS — M545 Low back pain, unspecified: Secondary | ICD-10-CM

## 2018-11-04 DIAGNOSIS — G8929 Other chronic pain: Secondary | ICD-10-CM

## 2018-11-04 NOTE — Progress Notes (Signed)
Office Visit Note   Patient: Amy Olson           Date of Birth: May 24, 1996           MRN: [id number] Visit Date: 11/04/2018 Requested by: Allegra Grana, FNP [address] [place], [place] [address] PCP: Allegra Grana, FNP  Subjective: Chief Complaint  Patient presents with  . Lower Back - Pain  . Left Leg - Pain    HPI: Amy Olson is a patient with low back pain.  Had fall in June 2018.  This happened at work.  She is had symptoms since then.  Typically is on a daily basis.  She describes pain radiating into the buttock and down the back of the left leg but not below the knee.  Denies much in the way of hamstring weakness.  She is taken Tylenol without much relief.  She works in Engineering ge[employer].  She reports pain both with walking and standing.  She has done a home exercise program.  Left symptoms are more than right and she has no radiation down the right-hand side              ROS: All systems reviewed are negative as they relate to the chief complaint within the history of present illness.  Patient denies  fevers or chills.   Assessment & Plan: Visit Diagnoses:  1. Chronic left-sided low back pain, unspecified whether sciatica present     Plan: Impression is low back pain radiating down that left side.  Plan is MRI L-spine evaluate left-sided radiculopathy.  She is failed conservative management and has had symptoms well over a year.  I think she likely has a bulging disc.  Radiographs show no significant degenerative changes.  She does have good flexibility.  Does have increased body mass index which may also be contributing to some of the prolonged issues with her pain.  Follow-up with me after that study  Follow-Up Instructions: Return for after MRI.   Orders:  Orders Placed This Encounter  Procedures  . XR Pelvis 1-2 Views  . XR Lumbar Spine 2-3 Views  . MR Lumbar Spine w/o contrast   No orders of the defined types were placed in this encounter.     Procedures: No procedures performed   Clinical Data: No additional findings.  Objective: Vital Signs: Ht 5\' 6"  (1.676 m)   Wt 263 lb (119.3 kg)   BMI 42.45 kg/m   Physical Exam:   Constitutional: Patient appears well-developed HEENT:  Head: Normocephalic Eyes:EOM are normal Neck: Normal range of motion Cardiovascular: Normal rate Pulmonary/chest: Effort normal Neurologic: Patient is alert Skin: Skin is warm Psychiatric: Patient has normal mood and affect    Ortho Exam: Ortho exam demonstrates pretty good forward bending and extension.  No real pain to palpation of the SI joint or in the buttock region on the left and right-hand side.  No trochanteric tenderness left or right.  No nerve root tension signs.  No groin pain with internal X rotation of the leg.  No other masses lymphadenopathy or skin changes noted in the region region of the hip or leg.  No paresthesias L1 S1 bilaterally.  Pedal pulses palpable.  Reflexes symmetric bilateral patella and Achilles.  Specialty Comments:  No specialty comments available.  Imaging: Xr Lumbar Spine 2-3 Views  Result Date: 11/04/2018 AP lateral lumbar spine reviewed.  Bony alignment normal.  No significant degenerative disc disease present.  No spondylo-listhesis.  Normal lumbar spine  Xr Pelvis 1-2 Views  Result Date: 11/04/2018 AP pelvis reviewed.  No significant arthritis in either hip joint.  SI joint and pubis intact.  No sacral abnormalities.  Normal AP pelvis    PMFS History: Patient Active Problem List   Diagnosis Date Noted  . Snores 08/26/2018  . Bleeding from the nose 07/04/2016  . Prediabetes 07/04/2016  . Routine physical examination 12/04/2015  . Attention deficit hyperactivity disorder (ADHD) 12/04/2015  . Depression with anxiety 12/04/2015  . Morbid obesity with BMI of 40.0-44.9, adult (HCC) 10/13/2015  . Extrinsic asthma 09/15/2013   Past Medical History:  Diagnosis Date  . allergies   . Allergy   .  Asthma   . Depression   . Diabetes mellitus without complication (HCC)    prediabetic    Family History  Problem Relation Age of Onset  . Rheum arthritis Maternal Grandmother   . Hyperlipidemia Maternal Grandmother   . Hypertension Maternal Grandmother   . Arthritis Maternal Grandmother   . Hyperlipidemia Mother   . Diabetes Brother   . Hyperlipidemia Maternal Grandfather   . Hypertension Maternal Grandfather   . Colon cancer Paternal Grandmother 2  . Breast cancer Neg Hx     Past Surgical History:  Procedure Laterality Date  . ADENOIDECTOMY     1998- only adenoids   Social History   Occupational History  . Occupation: school  Tobacco Use  . Smoking status: Never Smoker  . Smokeless tobacco: Never Used  Substance and Sexual Activity  . Alcohol use: Yes    Alcohol/week: 0.0 standard drinks    Comment: rare  . Drug use: No  . Sexual activity: Not Currently

## 2018-11-08 DIAGNOSIS — J301 Allergic rhinitis due to pollen: Secondary | ICD-10-CM | POA: Diagnosis not present

## 2018-11-10 ENCOUNTER — Ambulatory Visit
Admission: RE | Admit: 2018-11-10 | Discharge: 2018-11-10 | Disposition: A | Discharge status: Home / Self Care | Payer: 59 | Source: Ambulatory Visit | Attending: Orthopedic Surgery | Admitting: Orthopedic Surgery

## 2018-11-10 DIAGNOSIS — M545 Low back pain: Principal | ICD-10-CM

## 2018-11-10 DIAGNOSIS — G8929 Other chronic pain: Secondary | ICD-10-CM

## 2018-11-10 DIAGNOSIS — M5416 Radiculopathy, lumbar region: Secondary | ICD-10-CM | POA: Diagnosis not present

## 2018-11-15 DIAGNOSIS — J301 Allergic rhinitis due to pollen: Secondary | ICD-10-CM | POA: Diagnosis not present

## 2018-11-18 DIAGNOSIS — J301 Allergic rhinitis due to pollen: Secondary | ICD-10-CM | POA: Diagnosis not present

## 2018-11-20 ENCOUNTER — Ambulatory Visit (INDEPENDENT_AMBULATORY_CARE_PROVIDER_SITE_OTHER): Payer: 59 | Admitting: Orthopedic Surgery

## 2018-11-20 ENCOUNTER — Encounter (INDEPENDENT_AMBULATORY_CARE_PROVIDER_SITE_OTHER): Payer: Self-pay | Admitting: Orthopedic Surgery

## 2018-11-20 ENCOUNTER — Ambulatory Visit (INDEPENDENT_AMBULATORY_CARE_PROVIDER_SITE_OTHER): Payer: 59 | Admitting: Neurology

## 2018-11-20 DIAGNOSIS — G4710 Hypersomnia, unspecified: Secondary | ICD-10-CM | POA: Diagnosis not present

## 2018-11-20 DIAGNOSIS — G8929 Other chronic pain: Secondary | ICD-10-CM | POA: Diagnosis not present

## 2018-11-20 DIAGNOSIS — R351 Nocturia: Secondary | ICD-10-CM

## 2018-11-20 DIAGNOSIS — M545 Low back pain, unspecified: Secondary | ICD-10-CM

## 2018-11-20 DIAGNOSIS — R0683 Snoring: Secondary | ICD-10-CM

## 2018-11-20 DIAGNOSIS — G4719 Other hypersomnia: Secondary | ICD-10-CM

## 2018-11-20 DIAGNOSIS — J45909 Unspecified asthma, uncomplicated: Secondary | ICD-10-CM

## 2018-11-20 DIAGNOSIS — R0681 Apnea, not elsewhere classified: Secondary | ICD-10-CM

## 2018-11-20 DIAGNOSIS — G2581 Restless legs syndrome: Secondary | ICD-10-CM

## 2018-11-20 NOTE — Progress Notes (Signed)
Office Visit Note   Patient: Amy RepressJenny E Olson           Date of Birth: 12/16/1995           MRN: [id number] Visit Date: 11/20/2018 Requested by: Allegra GranaArnett, Margaret G, FNP [address] [place], [place] [id number] PCP: Allegra GranaArnett, Margaret G, FNP  Subjective: Chief Complaint  Patient presents with  . Lower Back - Pain    HPI: [name] is a patient with lumbar back pain.  Since of seen her she is had an MRI scan.  She is tried Tylenol and ibuprofen without relief.'s been going on for many months.  Does have some radiation into the left buttock.  She has to do a lot of standing at work.  She did do some therapy this summer.              ROS: All systems reviewed are negative as they relate to the chief complaint within the history of present illness.  Patient denies  fevers or chills.   Assessment & Plan: Visit Diagnoses:  1. Chronic left-sided low back pain, unspecified whether sciatica present     Plan: Impression is relatively good appearing MRI scan lumbar spine with mild facet degeneration at L4-5.  I think this could be the culprit.  No nerve root tension signs today.  Plan referral to Dr. Alvester MorinNewton for a series of lumbar spine ESI's which may include injecting that left sided L4-5 facet joint.  I think that would help her symptoms.  I will see her back as needed.  Follow-Up Instructions: No follow-ups on file.   Orders:  Orders Placed This Encounter  Procedures  . Ambulatory referral to Physical Medicine Rehab   No orders of the defined types were placed in this encounter.     Procedures: No procedures performed   Clinical Data: No additional findings.  Objective: Vital Signs: There were no vitals taken for this visit.  Physical Exam:   Constitutional: Patient appears well-developed HEENT:  Head: Normocephalic Eyes:EOM are normal Neck: Normal range of motion Cardiovascular: Normal rate Pulmonary/chest: Effort normal Neurologic: Patient is alert Skin: Skin is  warm Psychiatric: Patient has normal mood and affect    Ortho Exam: Ortho exam demonstrates full active and passive range of motion of the hips knees and ankles.  No nerve root tension signs.  Good strength in lower extremity muscles.  Has a little pain in that lower back region with some radiation into the left buttock region as well as some pain with forward and lateral bending.  Pain is mild.  No definite paresthesias in the legs.  Specialty Comments:  No specialty comments available.  Imaging: No results found.   PMFS History: Patient Active Problem List   Diagnosis Date Noted  . Snores 08/26/2018  . Bleeding from the nose 07/04/2016  . Prediabetes 07/04/2016  . Routine physical examination 12/04/2015  . Attention deficit hyperactivity disorder (ADHD) 12/04/2015  . Depression with anxiety 12/04/2015  . Morbid obesity with BMI of 40.0-44.9, adult (HCC) 10/13/2015  . Extrinsic asthma 09/15/2013   Past Medical History:  Diagnosis Date  . allergies   . Allergy   . Asthma   . Depression   . Diabetes mellitus without complication (HCC)    prediabetic    Family History  Problem Relation Age of Onset  . Rheum arthritis Maternal Grandmother   . Hyperlipidemia Maternal Grandmother   . Hypertension Maternal Grandmother   . Arthritis Maternal Grandmother   .  Hyperlipidemia Mother   . Diabetes Brother   . Hyperlipidemia Maternal Grandfather   . Hypertension Maternal Grandfather   . Colon cancer Paternal Grandmother [id number]  . Breast cancer Neg Hx     Past Surgical History:  Procedure Laterality Date  . ADENOIDECTOMY     1998- only adenoids   Social History   Occupational History  . Occupation: school  Tobacco Use  . Smoking status: Never Smoker  . Smokeless tobacco: Never Used  Substance and Sexual Activity  . Alcohol use: Yes    Alcohol/week: 0.0 standard drinks    Comment: rare  . Drug use: No  . Sexual activity: Not Currently

## 2018-11-22 DIAGNOSIS — J301 Allergic rhinitis due to pollen: Secondary | ICD-10-CM | POA: Diagnosis not present

## 2018-11-25 NOTE — Procedures (Signed)
[facility] [address] [place], [place] [address] NAME: Amy Olson                                                           DOB: 08/23/1996 MEDICAL RECORD no: [id number]                                 DOS:  11/20/2018 REFERRING PHYSICIAN: Rennie Plowman, FNP STUDY PERFORMED: Home Sleep Test on Watch Pat HISTORY: 23 year old woman with a history diabetes, asthma, allergies, depression, and morbid obesity with BMI of over 40, who reports snoring and excessive daytime somnolence as well as witnessed apneas. Her Epworth sleepiness score is 12 out of 24. BMI of 42.4. STUDY RESULTS:   Total Recording Time: 8 hrs, 25 mins; Total Sleep Time:  6 hrs, Total Apnea/Hypopnea Index (AHI):   2.2/h; RDI: 5.9 /h; REM AHI: 8.6 /h Average Oxygen Saturation:  97 %; Lowest Oxygen Desaturation:  93 %  Total Time Oxygen Saturation Below or at 88 %:  0.0 minutes  Average Heart Rate:   84 bpm (between 64 and 110 bpm) IMPRESSION: Primary snoring RECOMMENDATION: This home sleep test does not demonstrate any significant obstructive or central sleep disordered breathing with the exception of mild REM-sleep related OSA. Some snoring was noted, intermittent. CPAP or autoPAP therapy is not warranted based on this test. Other causes of the patient's symptoms, including circadian rhythm disturbances, an underlying mood disorder, medication effect and/or an underlying medical problem cannot be ruled out based on this test. Clinical correlation is recommended. The patient should be cautioned not to drive, work at heights, or operate dangerous or heavy equipment when tired or sleepy. Review and reiteration of good sleep hygiene measures should be pursued with any patient. Weight loss and avoidance of the supine sleep position with likely aid in reducing her snoring and mild REM related sleep apnea.  The patient can follow up with her referring provider, who will be notified of the test  results. An appointment in sleep clinic can be made as necessary.   I certify that I have reviewed the raw data recording prior to the issuance of this report in accordance with the standards of the American Academy of Sleep Medicine (AASM).  Huston Foley, MD, PhD [facility]) Diplomat, ABPN (Neurology and Sleep)

## 2018-11-25 NOTE — Progress Notes (Signed)
Patient referred by Rennie Plowman, NP, seen by me on 10/28/18, HST on 11/20/18.   Please call and notify the patient that the recent home sleep test did not show any significant obstructive sleep apnea with the exception of mild REM-sleep related OSA. Some snoring was noted, intermittent. CPAP or autoPAP therapy is not warranted based on this test. Weight loss and avoidance of the supine sleep position with likely aid in reducing her snoring and mild REM related sleep apnea.  She can FU with Primary Care.  Please remind patient to try to maintain good sleep hygiene, which means: Keep a regular sleep and wake schedule and make enough time for sleep (7 1/2 to 8 1/2 hours for the average adult), try not to exercise or have a meal within 2 hours of your bedtime, try to keep your bedroom conducive for sleep, that is, cool and dark, without light distractors such as an illuminated alarm clock, and refrain from watching TV right before sleep or in the middle of the night and do not keep the TV or radio on during the night. If a nightlight is used, have it away from the visual field. Also, try not to use or play on electronic devices at bedtime, such as your cell phone, tablet PC or laptop. If you like to read at bedtime on an electronic device, try to dim the background light as much as possible. Do not eat in the middle of the night. Keep pets away from the bedroom environment. For stress relief, try meditation, deep breathing exercises (there are many books and CDs available), a white noise machine or fan can help to diffuse other noise distractors, such as traffic noise. Do not drink alcohol before bedtime, as it can disturb sleep and cause middle of the night awakenings. Never mix alcohol and sedating medications! Avoid narcotic pain medication close to bedtime, as opioids/narcotics can suppress breathing drive and breathing effort.    Huston Foley, MD, PhD [facility])

## 2018-11-26 ENCOUNTER — Telehealth: Payer: Self-pay

## 2018-11-26 NOTE — Telephone Encounter (Addendum)
I called pt to discuss her HST results. No answer,VM is full, will try calling her again later.

## 2018-11-26 NOTE — Telephone Encounter (Signed)
-----   Message from Huston Foley, MD sent at 11/25/2018  8:39 AM EST ----- Patient referred by Rennie Plowman, NP, seen by me on 10/28/18, HST on 11/20/18.   Please call and notify the patient that the recent home sleep test did not show any significant obstructive sleep apnea with the exception of mild REM-sleep related OSA. Some snoring was noted, intermittent. CPAP or autoPAP therapy is not warranted based on this test. Weight loss and avoidance of the supine sleep position with likely aid in reducing her snoring and mild REM related sleep apnea.  She can FU with Primary Care.  Please remind patient to try to maintain good sleep hygiene, which means: Keep a regular sleep and wake schedule and make enough time for sleep (7 1/2 to 8 1/2 hours for the average adult), try not to exercise or have a meal within 2 hours of your bedtime, try to keep your bedroom conducive for sleep, that is, cool and dark, without light distractors such as an illuminated alarm clock, and refrain from watching TV right before sleep or in the middle of the night and do not keep the TV or radio on during the night. If a nightlight is used, have it away from the visual field. Also, try not to use or play on electronic devices at bedtime, such as your cell phone, tablet PC or laptop. If you like to read at bedtime on an electronic device, try to dim the background light as much as possible. Do not eat in the middle of the night. Keep pets away from the bedroom environment. For stress relief, try meditation, deep breathing exercises (there are many books and CDs available), a white noise machine or fan can help to diffuse other noise distractors, such as traffic noise. Do not drink alcohol before bedtime, as it can disturb sleep and cause middle of the night awakenings. Never mix alcohol and sedating medications! Avoid narcotic pain medication close to bedtime, as opioids/narcotics can suppress breathing drive and breathing effort.    Huston Foley, MD, PhD [facility])

## 2018-11-28 NOTE — Telephone Encounter (Signed)
I called pt. I advised pt that Dr. Frances FurbishAthar reviewed pt's sleep study and found that pt did not show any significant osa, just mild REM dependent osa, and intermittent snoring. Dr. Frances FurbishAthar recommends that pt pursue weight loss and the avoidance of supine sleep. I reviewed sleep hygiene recommendations with the pt, including trying to keep a regular sleep wake schedule, avoiding electronics in the bedroom, keeping the bedroom cool, dark, and quiet, and avoiding eating or exercising within 2 hours of bedtime as well as eating in the middle of the night. I advised pt to keep pets out of the bedroom. I discussed with pt the importance of stress relief and to try meditation, deep breathing exercises, and/or a white noise machine or fan to diffuse other noise distractors. I advised pt to not drink alcohol before bedtime and to never mix alcohol and sedating medications. Pt was advised to avoid narcotic pain medication close to bedtime. I advised pt that a copy of these sleep study results will be sent to Rennie PlowmanMargaret Arnett, NP. Pt verbalized understanding of results. Pt had no questions at this time but was encouraged to call back if questions arise.

## 2018-11-29 DIAGNOSIS — J301 Allergic rhinitis due to pollen: Secondary | ICD-10-CM | POA: Diagnosis not present

## 2018-11-30 ENCOUNTER — Other Ambulatory Visit: Payer: Self-pay

## 2018-11-30 ENCOUNTER — Emergency Department
Admission: EM | Admit: 2018-11-30 | Discharge: 2018-11-30 | Disposition: A | Discharge status: Home / Self Care | Payer: 59 | Attending: Emergency Medicine | Admitting: Emergency Medicine

## 2018-11-30 DIAGNOSIS — Z79899 Other long term (current) drug therapy: Secondary | ICD-10-CM | POA: Diagnosis not present

## 2018-11-30 DIAGNOSIS — Z7984 Long term (current) use of oral hypoglycemic drugs: Secondary | ICD-10-CM | POA: Diagnosis not present

## 2018-11-30 DIAGNOSIS — Y9389 Activity, other specified: Secondary | ICD-10-CM | POA: Insufficient documentation

## 2018-11-30 DIAGNOSIS — Y998 Other external cause status: Secondary | ICD-10-CM | POA: Diagnosis not present

## 2018-11-30 DIAGNOSIS — Y92009 Unspecified place in unspecified non-institutional (private) residence as the place of occurrence of the external cause: Secondary | ICD-10-CM | POA: Diagnosis not present

## 2018-11-30 DIAGNOSIS — J45909 Unspecified asthma, uncomplicated: Secondary | ICD-10-CM | POA: Diagnosis not present

## 2018-11-30 DIAGNOSIS — T300 Burn of unspecified body region, unspecified degree: Secondary | ICD-10-CM

## 2018-11-30 DIAGNOSIS — X18XXXA Contact with other hot metals, initial encounter: Secondary | ICD-10-CM | POA: Insufficient documentation

## 2018-11-30 DIAGNOSIS — T23112A Burn of first degree of left thumb (nail), initial encounter: Secondary | ICD-10-CM | POA: Diagnosis not present

## 2018-11-30 DIAGNOSIS — T23012A Burn of unspecified degree of left thumb (nail), initial encounter: Secondary | ICD-10-CM | POA: Diagnosis not present

## 2018-11-30 MED ORDER — SILVER SULFADIAZINE 1 % EX CREA
TOPICAL_CREAM | Freq: Once | CUTANEOUS | Status: AC
  Administered 2018-11-30: 1 via TOPICAL

## 2018-11-30 MED ORDER — IBUPROFEN 800 MG PO TABS
800.0000 mg | ORAL_TABLET | Freq: Once | ORAL | Status: AC
  Administered 2018-11-30: 800 mg via ORAL
  Filled 2018-11-30: qty 1

## 2018-11-30 MED ORDER — HYDROCODONE-ACETAMINOPHEN 5-325 MG PO TABS
1.0000 | ORAL_TABLET | Freq: Once | ORAL | Status: AC
  Administered 2018-11-30: 1 via ORAL
  Filled 2018-11-30: qty 1

## 2018-11-30 MED ORDER — IBUPROFEN 800 MG PO TABS: 800.0000 mg | ORAL_TABLET | Freq: Three times a day (TID) | ORAL | 0 refills | Status: DC | PRN

## 2018-11-30 MED ORDER — HYDROCODONE-ACETAMINOPHEN 5-325 MG PO TABS: 1.0000 | ORAL_TABLET | Freq: Four times a day (QID) | ORAL | 0 refills | Status: DC | PRN
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
## 2018-12-23 ENCOUNTER — Other Ambulatory Visit: Payer: Self-pay | Admitting: Family

## 2018-12-23 DIAGNOSIS — R7303 Prediabetes: Secondary | ICD-10-CM

## 2018-12-27 DIAGNOSIS — J301 Allergic rhinitis due to pollen: Secondary | ICD-10-CM | POA: Diagnosis not present

## 2019-01-08 ENCOUNTER — Encounter: Payer: Self-pay | Admitting: Family

## 2019-02-13 ENCOUNTER — Other Ambulatory Visit: Payer: Self-pay | Admitting: Family

## 2019-02-14 NOTE — Telephone Encounter (Signed)
Last OV 08/26/2018  Last refilled 08/12/2018 disp 84 with no refills   Next OV none scheduled   Sent to PCP for approval

## 2019-02-17 ENCOUNTER — Encounter: Payer: Self-pay | Admitting: Family

## 2019-02-17 NOTE — Telephone Encounter (Signed)
Call pt  No problem in refiling her birth control however I do not see any notes in which we discussed this.  Was this perhaps written by GYN in the past?  To refill Medication, will need asked the following questions Please ensure NO to all of the following:    history of DVT personal or family history Clotting disorders personal or family history  migraine with aura personal history of cancer.  pregnant or breast-feeding  concern for STDs Smoker  Let me know

## 2019-03-14 DIAGNOSIS — J301 Allergic rhinitis due to pollen: Secondary | ICD-10-CM | POA: Diagnosis not present

## 2019-05-19 ENCOUNTER — Other Ambulatory Visit: Payer: Self-pay | Admitting: Family

## 2019-06-15 ENCOUNTER — Other Ambulatory Visit: Payer: Self-pay | Admitting: Family

## 2019-07-25 ENCOUNTER — Other Ambulatory Visit: Payer: Self-pay | Admitting: Family

## 2019-08-13 IMAGING — MR MR LUMBAR SPINE W/O CM
4 of 5 series · 27 of 48 positions shown · non-contrast
Comparison: Lumbar radiographs 0010

CLINICAL DATA: Chronic left-sided low back pain with left
radiculopathy

EXAM:
MRI LUMBAR SPINE WITHOUT CONTRAST
TECHNIQUE: Multiplanar, multisequence MR imaging of the lumbar spine was
performed. No intravenous contrast was administered.

[Series 3: T2 · sagittal · 4.0mm · 0.55mm/px · 6 of 13 slices shown (1 of 2)]
[im 1/13]
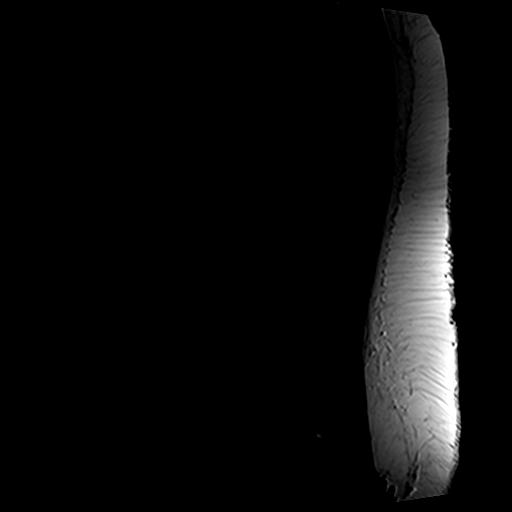
[im 3/13]
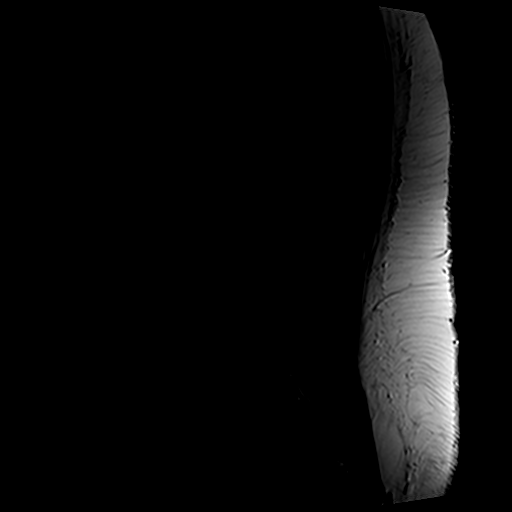
[im 5/13]
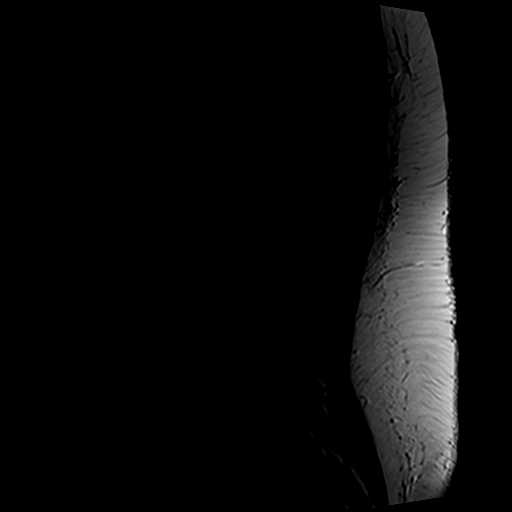
[im 8/13]
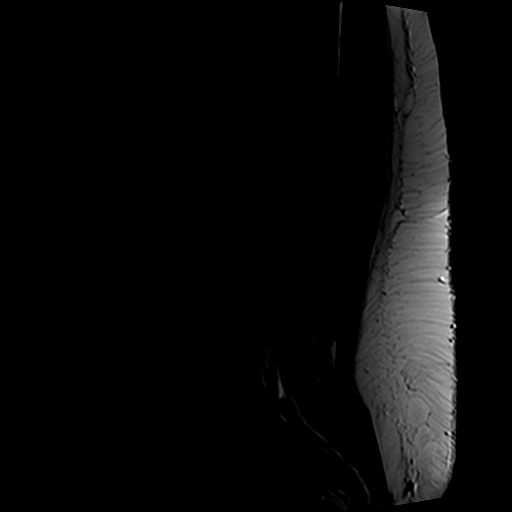
[im 10/13]
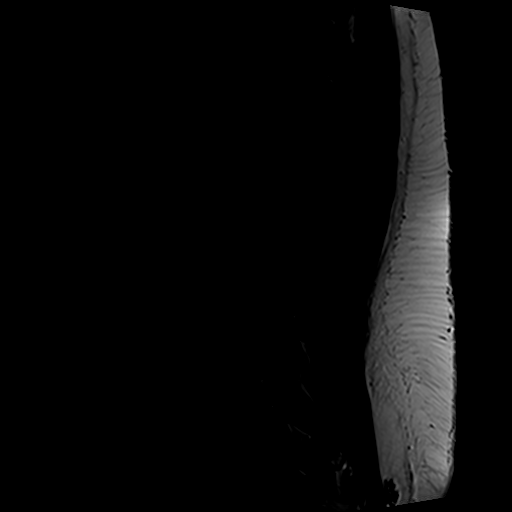
[im 13/13]
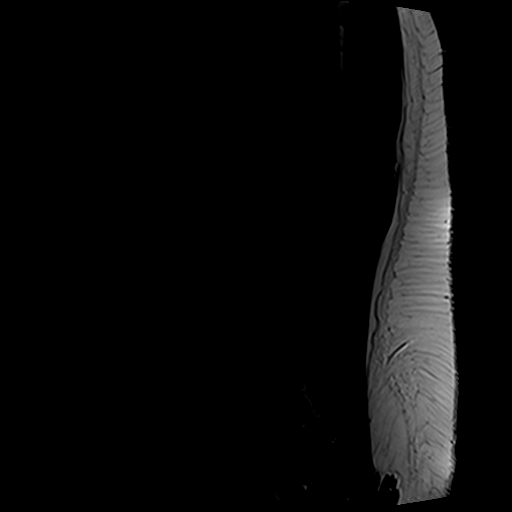

[Series 4: T1 · sagittal · 4.0mm · 0.55mm/px · 5 of 13 slices shown (1 of 2)]
[im 1/13]
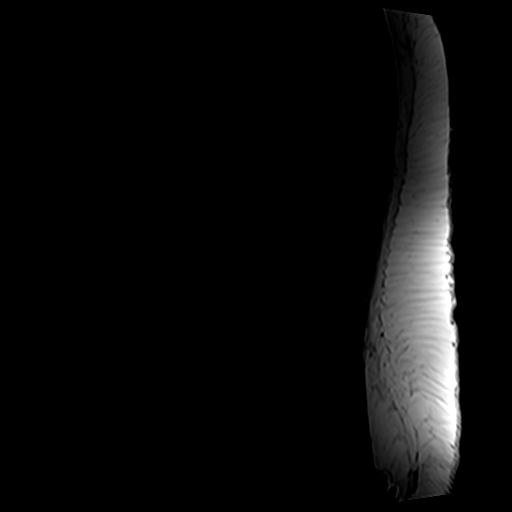
[im 4/13]
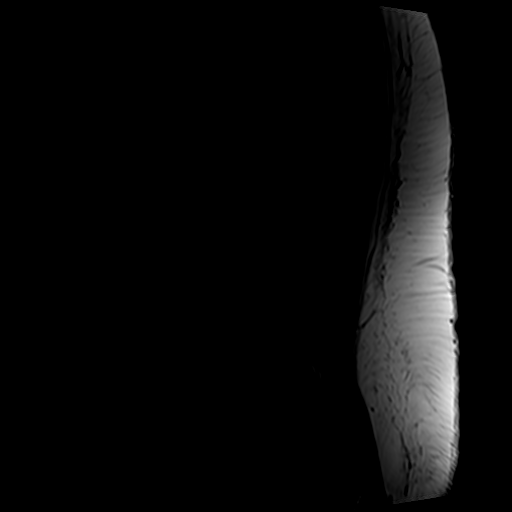
[im 7/13]
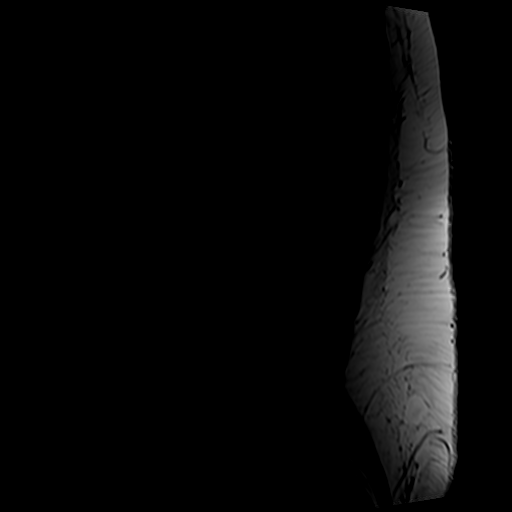
[im 10/13]
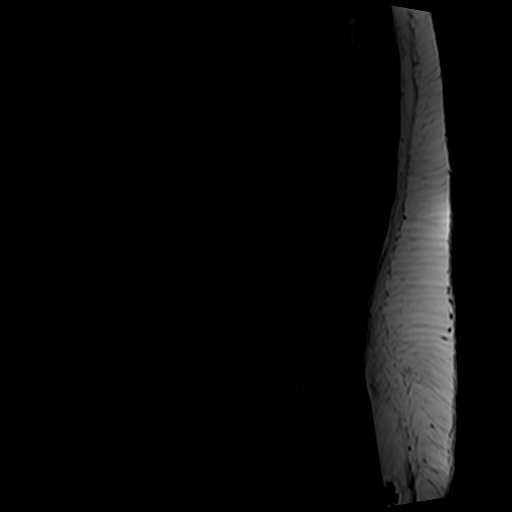
[im 13/13]
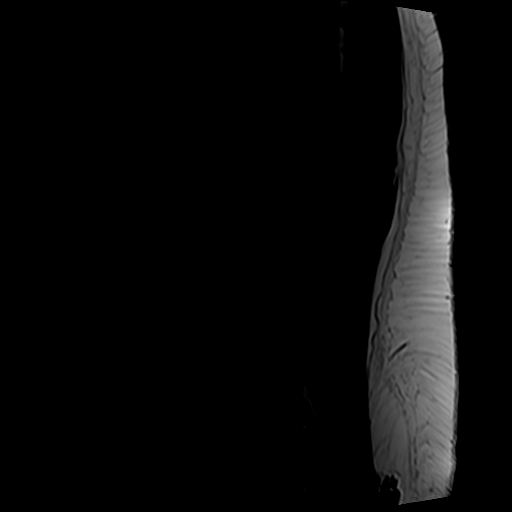

[Series 6: T2 · axial · 4.0mm · 0.70mm/px · z∈[-70,+133]mm · 10 of 39 slices shown (2 of 2)]
[im 3/39]
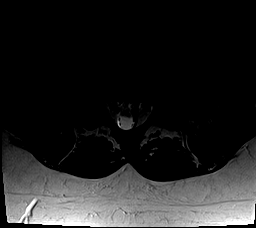
[im 6/39]
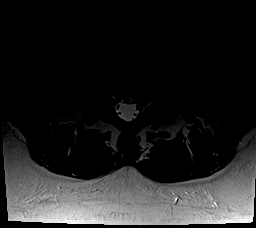
[im 8/39]
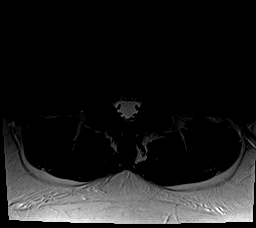
[im 13/39]
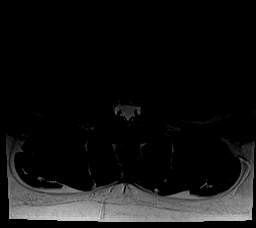
[im 18/39]
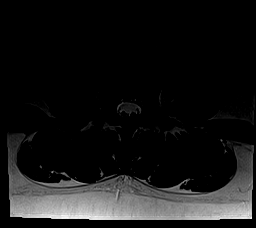
[im 21/39]
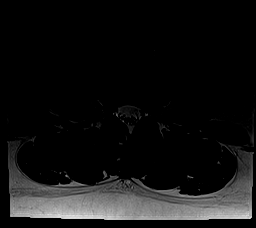
[im 23/39]
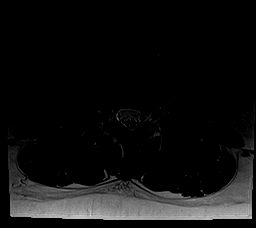
[im 28/39]
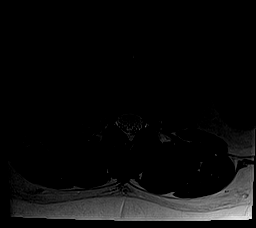
[im 33/39]
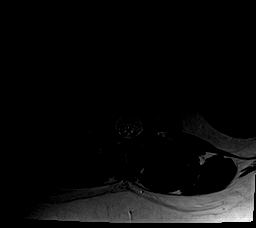
[im 39/39]
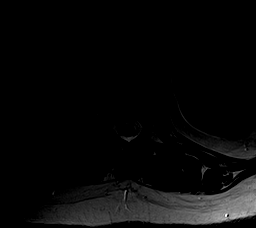

[Series 7: T1 · axial · 4.0mm · 0.35mm/px · z∈[-70,+103]mm · 6 of 39 slices shown (2 of 2)]
[im 3/39]
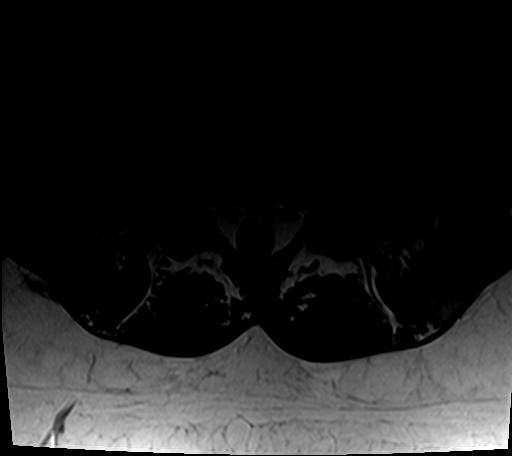
[im 6/39]
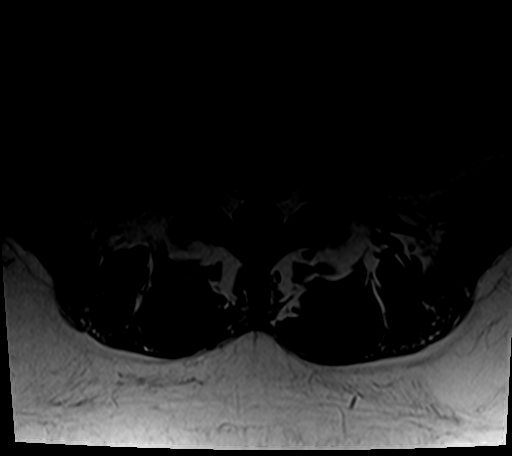
[im 8/39]
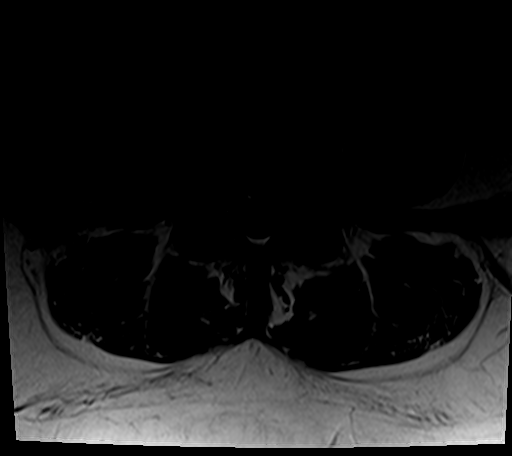
[im 13/39]
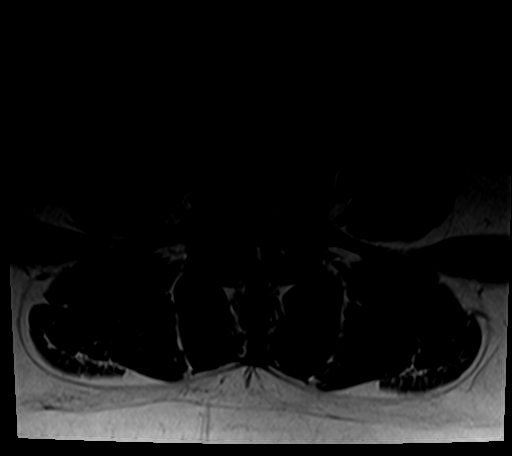
[im 21/39]
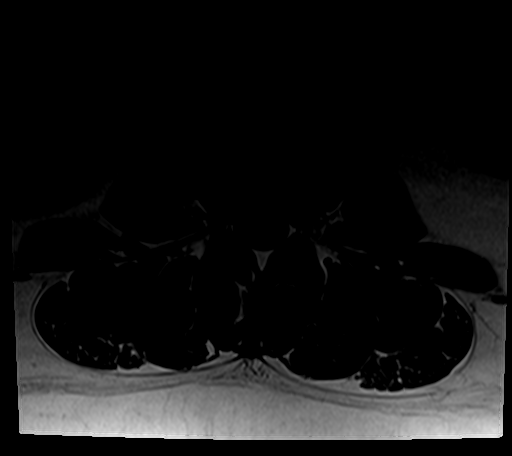
[im 33/39]
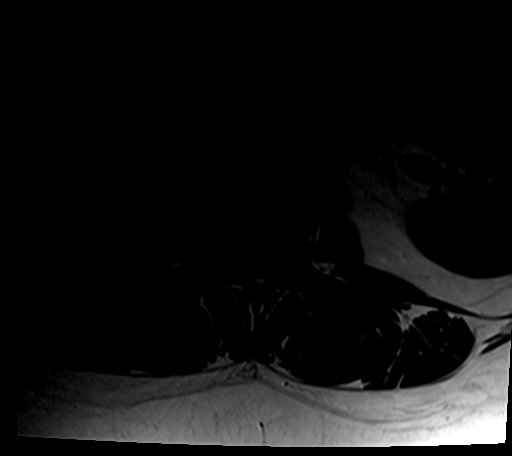

[27 of 48 positions shown; findings below may reference images not displayed]

FINDINGS: Segmentation:  Normal

Alignment:  Normal

Vertebrae:  Normal bone marrow.  Negative for fracture or mass.

Conus medullaris and cauda equina: Conus extends to the L1-2 level.
Conus and cauda equina appear normal.

Paraspinal and other soft tissues: Negative for paraspinous mass or
fluid collection

Disc levels:

L1-2: Negative

L2-3: Negative

L3-4: Negative

L4-5: Mild facet degeneration. Negative for disc protrusion or
stenosis

L5-S1: Negative
IMPRESSION: Mild facet degeneration at L4-5. Negative for disc protrusion or
lumbar stenosis

## 2019-08-29 ENCOUNTER — Encounter: Payer: Self-pay | Admitting: Family

## 2019-09-05 ENCOUNTER — Other Ambulatory Visit: Payer: Self-pay | Admitting: Family

## 2019-09-05 DIAGNOSIS — Z0000 Encounter for general adult medical examination without abnormal findings: Secondary | ICD-10-CM

## 2019-09-05 DIAGNOSIS — J4520 Mild intermittent asthma, uncomplicated: Secondary | ICD-10-CM

## 2019-09-30 DIAGNOSIS — J301 Allergic rhinitis due to pollen: Secondary | ICD-10-CM | POA: Diagnosis not present

## 2019-10-07 DIAGNOSIS — J301 Allergic rhinitis due to pollen: Secondary | ICD-10-CM | POA: Diagnosis not present

## 2019-10-14 DIAGNOSIS — J301 Allergic rhinitis due to pollen: Secondary | ICD-10-CM | POA: Diagnosis not present

## 2019-10-21 DIAGNOSIS — J301 Allergic rhinitis due to pollen: Secondary | ICD-10-CM | POA: Diagnosis not present

## 2019-10-27 ENCOUNTER — Other Ambulatory Visit: Payer: Self-pay

## 2019-10-27 ENCOUNTER — Encounter: Payer: Self-pay | Admitting: Family

## 2019-10-27 ENCOUNTER — Ambulatory Visit (INDEPENDENT_AMBULATORY_CARE_PROVIDER_SITE_OTHER): Payer: BC Managed Care – PPO | Admitting: Family

## 2019-10-27 VITALS — Ht 66.0 in | Wt 245.0 lb

## 2019-10-27 DIAGNOSIS — R413 Other amnesia: Secondary | ICD-10-CM | POA: Diagnosis not present

## 2019-10-27 DIAGNOSIS — F418 Other specified anxiety disorders: Secondary | ICD-10-CM | POA: Diagnosis not present

## 2019-10-27 DIAGNOSIS — Z0001 Encounter for general adult medical examination with abnormal findings: Secondary | ICD-10-CM | POA: Diagnosis not present

## 2019-10-27 DIAGNOSIS — Z0000 Encounter for general adult medical examination without abnormal findings: Secondary | ICD-10-CM

## 2019-10-27 MED ORDER — ESCITALOPRAM OXALATE 10 MG PO TABS: 10.0000 mg | ORAL_TABLET | Freq: Every day | ORAL | 1 refills | Status: DC

## 2019-10-27 NOTE — Assessment & Plan Note (Addendum)
Encouraged continued SBE.  She will return for Pap smear later this year.  CPE labs have been ordered.

## 2019-10-27 NOTE — Progress Notes (Signed)
Virtual Visit via Video Note  I connected with@  on 10/27/19 at  2:00 PM EST by a video enabled telemedicine application and verified that I am speaking with the correct person using two identifiers.  Location patient: home Location provider: home office Persons participating in the virtual visit: patient, provider  I discussed the limitations of evaluation and management by telemedicine and the availability of in person appointments. The patient expressed understanding and agreed to proceed.  Interactive audio and video telecommunications were attempted between this provider and patient and I was able to see patient at first; however failed, due to patient having technical difficulties or patient did not have access to video capability.  We continued and completed visit with audio only.    HPI:   Here for CPE.   Complains of memory loss for the past 2 months, unchanged.  increased stress from parents divorce 2 years ago.  Describes not remembering conversation with friends or in particular when involves her parents and divorce. Trouble falling asleep.  ON average 7 hours of sleep.  Normal sleep study 10/2018 with Dr Frances Furbish.  'Not sure if depression or anxiety' . Came off lexapro. Not sure if lexapro helpful with memory. No SI/ HI.  No HA, vision changes, numbness in face or arms.  H/o ADHD however no medication at this time.   Pap due.  Tdap UTD SBE at home.  Grades at [employer] B+.   ROS: See pertinent positives and negatives per HPI.  Past Medical History:  Diagnosis Date  . allergies   . Allergy   . Asthma   . Depression   . Diabetes mellitus without complication (HCC)    prediabetic    Past Surgical History:  Procedure Laterality Date  . ADENOIDECTOMY     1998- only adenoids    Family History  Problem Relation Age of Onset  . Rheum arthritis Maternal Grandmother   . Hyperlipidemia Maternal Grandmother   . Hypertension Maternal Grandmother   . Arthritis Maternal  Grandmother   . Memory loss Maternal Grandmother   . Hyperlipidemia Mother   . Diabetes Brother   . Hyperlipidemia Maternal Grandfather   . Hypertension Maternal Grandfather   . Colon cancer Paternal Grandmother 94  . Dementia Paternal Great-grandmother   . Breast cancer Neg Hx     SOCIAL HX: never smoker   Current Outpatient Medications:  .  albuterol (PROAIR HFA) 108 (90 BASE) MCG/ACT inhaler, Inhale 2 puffs into the lungs every 6 (six) hours as needed for wheezing or shortness of breath., Disp: 1 Inhaler, Rfl: 1 .  APRI 0.15-30 MG-MCG tablet, TAKE 1 TABLET BY MOUTH  DAILY, Disp: 84 tablet, Rfl: 3 .  EPINEPHrine 0.3 mg/0.3 mL IJ SOAJ injection, epinephrine 0.3 mg/0.3 mL injection, auto-injector  INJECT INTRAMUSCULARLY AS DIRECTED, Disp: , Rfl:  .  ibuprofen (ADVIL,MOTRIN) 800 MG tablet, Take 1 tablet (800 mg total) by mouth every 8 (eight) hours as needed for moderate pain., Disp: 15 tablet, Rfl: 0 .  metFORMIN (GLUCOPHAGE) 1000 MG tablet, TAKE 1 TABLET(1000 MG) BY MOUTH TWICE DAILY WITH A MEAL, Disp: 60 tablet, Rfl: 0 .  montelukast (SINGULAIR) 10 MG tablet, TAKE 1 TABLET(10 MG) BY MOUTH AT BEDTIME, Disp: 90 tablet, Rfl: 3 .  QVAR 80 MCG/ACT inhaler, TAKE 2 PUFFS FIRST THING IN THE MORNING AND THEN  ANOTHER 2 PUFFS ABOUT 12  HOURS LATER., Disp: 3 Inhaler, Rfl: 0 .  escitalopram (LEXAPRO) 10 MG tablet, Take 1 tablet (10 mg total) by mouth  daily., Disp: 90 tablet, Rfl: 1  EXAM:  VITALS per patient if applicable:  GENERAL: alert, oriented, appears well and in no acute distress  HEENT: atraumatic, conjunttiva clear, no obvious abnormalities on inspection of external nose and ears  NECK: normal movements of the head and neck  LUNGS: on inspection no signs of respiratory distress, breathing rate appears normal, no obvious gross SOB, gasping or wheezing  CV: no obvious cyanosis  MS: moves all visible extremities without noticeable abnormality  PSYCH/NEURO: pleasant and  cooperative, no obvious depression or anxiety, speech and thought processing grossly intact  ASSESSMENT AND PLAN:  Discussed the following assessment and plan:  Routine physical examination - Plan: CBC with Differential/Platelet, Comprehensive metabolic panel-FUTURE, Hemoglobin A1c, Lipid panel-future, TSH-future, VITAMIN D 25 Hydroxy (Vit-D Deficiency, Fractures)  Memory loss - Plan: TSH-future, B12 and Folate Panel, RPR, [place] counseling- Ambulatory referral to Psychology, escitalopram (LEXAPRO) 10 MG tablet  Depression with anxiety - Plan: [place] counseling- Ambulatory referral to Psychology, escitalopram (LEXAPRO) 10 MG tablet Problem List Items Addressed This Visit      Other   Depression with anxiety   Relevant Medications   escitalopram (LEXAPRO) 10 MG tablet   Other Relevant Orders   [place] counseling- Ambulatory referral to Psychology   Memory loss    Suspect multifactorial including depression, poor sleep quality.  Considering ADHD as not on medication at this time.   Patient and I jointly agreed we would start  with lab evaluation and a restart of the Lexapro.  If no improvement will continue evaluation with  referral to neurology and brain imaging.  Patient agreed with plan      Relevant Medications   escitalopram (LEXAPRO) 10 MG tablet   Other Relevant Orders   TSH-future   B12 and Folate Panel   RPR   [place] counseling- Ambulatory referral to Psychology   Routine physical examination - Primary    Encouraged continued SBE.  She will return for Pap smear later this year.  CPE labs have been ordered.      Relevant Orders   CBC with Differential/Platelet   Comprehensive metabolic panel-FUTURE   Hemoglobin A1c   Lipid panel-future   TSH-future   VITAMIN D 25 Hydroxy (Vit-D Deficiency, Fractures)      -we discussed possible serious and likely etiologies, options for evaluation and workup, limitations of telemedicine visit vs in person visit,  treatment, treatment risks and precautions. Pt prefers to treat via telemedicine empirically rather then risking or undertaking an in person visit at this moment. Patient agrees to seek prompt in person care if worsening, new symptoms arise, or if is not improving with treatment.   I discussed the assessment and treatment plan with the patient. The patient was provided an opportunity to ask questions and all were answered. The patient agreed with the plan and demonstrated an understanding of the instructions.   The patient was advised to call back or seek an in-person evaluation if the symptoms worsen or if the condition fails to improve as anticipated.   Mable Paris, FNP

## 2019-10-27 NOTE — Assessment & Plan Note (Addendum)
Suspect multifactorial including depression, poor sleep quality.  Considering ADHD as not on medication at this time.   Patient and I jointly agreed we would start  with lab evaluation and a restart of the Lexapro.  If no improvement will continue evaluation with  referral to neurology and brain imaging.  Patient agreed with plan

## 2019-10-28 DIAGNOSIS — J301 Allergic rhinitis due to pollen: Secondary | ICD-10-CM | POA: Diagnosis not present

## 2019-11-04 DIAGNOSIS — J301 Allergic rhinitis due to pollen: Secondary | ICD-10-CM | POA: Diagnosis not present

## 2019-11-11 ENCOUNTER — Other Ambulatory Visit (INDEPENDENT_AMBULATORY_CARE_PROVIDER_SITE_OTHER): Payer: BC Managed Care – PPO

## 2019-11-11 ENCOUNTER — Other Ambulatory Visit: Payer: Self-pay

## 2019-11-11 DIAGNOSIS — Z0000 Encounter for general adult medical examination without abnormal findings: Secondary | ICD-10-CM

## 2019-11-11 DIAGNOSIS — R413 Other amnesia: Secondary | ICD-10-CM | POA: Diagnosis not present

## 2019-11-11 DIAGNOSIS — J301 Allergic rhinitis due to pollen: Secondary | ICD-10-CM | POA: Diagnosis not present

## 2019-11-11 LAB — COMPREHENSIVE METABOLIC PANEL
ALT: 14 U/L (ref 0–35)
AST: 22 U/L (ref 0–37)
Albumin: 3.8 g/dL (ref 3.5–5.2)
Alkaline Phosphatase: 60 U/L (ref 39–117)
BUN: 9 mg/dL (ref 6–23)
CO2: 21 mEq/L (ref 19–32)
Calcium: 9 mg/dL (ref 8.4–10.5)
Chloride: 103 mEq/L (ref 96–112)
Creatinine, Ser: 0.72 mg/dL (ref 0.40–1.20)
GFR: 98.42 mL/min (ref >60.00)
Glucose, Bld: 169 mg/dL — ABNORMAL HIGH (ref 70–99)
Potassium: 4 mEq/L (ref 3.5–5.1)
Sodium: 137 mEq/L (ref 135–145)
Total Bilirubin: 0.5 mg/dL (ref 0.2–1.2)
Total Protein: 7 g/dL (ref 6.0–8.3)

## 2019-11-11 LAB — B12 AND FOLATE PANEL
Folate: >24.1 ng/mL (ref >5.9)
Vitamin B-12: 276 pg/mL (ref 211–911)

## 2019-11-11 LAB — CBC WITH DIFFERENTIAL/PLATELET
Basophils Absolute: 0 10*3/uL (ref 0.0–0.1)
Basophils Relative: 0.6 % (ref 0.0–3.0)
Eosinophils Absolute: 0.2 10*3/uL (ref 0.0–0.7)
Eosinophils Relative: 2.7 % (ref 0.0–5.0)
HCT: 37.5 % (ref 36.0–46.0)
Hemoglobin: 12.5 g/dL (ref 12.0–15.0)
Lymphocytes Relative: 36.6 % (ref 12.0–46.0)
Lymphs Abs: 2.2 10*3/uL (ref 0.7–4.0)
MCHC: 33.3 g/dL (ref 30.0–36.0)
MCV: 87.5 fl (ref 78.0–100.0)
Monocytes Absolute: 0.4 10*3/uL (ref 0.1–1.0)
Monocytes Relative: 6.2 % (ref 3.0–12.0)
Neutro Abs: 3.2 10*3/uL (ref 1.4–7.7)
Neutrophils Relative %: 53.9 % (ref 43.0–77.0)
Platelets: 314 10*3/uL (ref 150.0–400.0)
RBC: 4.28 Mil/uL (ref 3.87–5.11)
RDW: 14.9 % (ref 11.5–15.5)
WBC: 6 10*3/uL (ref 4.0–10.5)

## 2019-11-11 LAB — TSH: TSH: 4.46 u[IU]/mL (ref 0.35–4.50)

## 2019-11-11 LAB — LDL CHOLESTEROL, DIRECT: Direct LDL: 58 mg/dL

## 2019-11-11 LAB — LIPID PANEL
Cholesterol: 143 mg/dL (ref 0–200)
HDL: 41.6 mg/dL (ref >39.00)
NonHDL: 101.08
Total CHOL/HDL Ratio: 3
Triglycerides: 381 mg/dL — ABNORMAL HIGH (ref 0.0–149.0)
VLDL: 76.2 mg/dL — ABNORMAL HIGH (ref 0.0–40.0)

## 2019-11-11 LAB — HEMOGLOBIN A1C: Hgb A1c MFr Bld: 8.7 % — ABNORMAL HIGH (ref 4.6–6.5)

## 2019-11-11 LAB — VITAMIN D 25 HYDROXY (VIT D DEFICIENCY, FRACTURES): VITD: 33.33 ng/mL (ref 30.00–100.00)

## 2019-11-12 ENCOUNTER — Encounter: Payer: Self-pay | Admitting: Family

## 2019-11-12 LAB — RPR: RPR Ser Ql: NONREACTIVE

## 2019-11-18 DIAGNOSIS — J301 Allergic rhinitis due to pollen: Secondary | ICD-10-CM | POA: Diagnosis not present

## 2019-11-25 ENCOUNTER — Ambulatory Visit (INDEPENDENT_AMBULATORY_CARE_PROVIDER_SITE_OTHER): Payer: BC Managed Care – PPO | Admitting: Psychology

## 2019-11-25 DIAGNOSIS — F411 Generalized anxiety disorder: Secondary | ICD-10-CM

## 2019-12-02 ENCOUNTER — Other Ambulatory Visit: Payer: Self-pay | Admitting: Family

## 2019-12-02 ENCOUNTER — Ambulatory Visit: Payer: BC Managed Care – PPO | Admitting: Psychology

## 2019-12-04 DIAGNOSIS — J301 Allergic rhinitis due to pollen: Secondary | ICD-10-CM | POA: Diagnosis not present

## 2019-12-09 ENCOUNTER — Ambulatory Visit: Payer: BC Managed Care – PPO | Admitting: Psychology

## 2019-12-18 DIAGNOSIS — J301 Allergic rhinitis due to pollen: Secondary | ICD-10-CM | POA: Diagnosis not present

## 2019-12-24 ENCOUNTER — Encounter: Payer: Self-pay | Admitting: Family

## 2019-12-24 ENCOUNTER — Other Ambulatory Visit: Payer: Self-pay

## 2019-12-24 MED ORDER — DESOGESTREL-ETHINYL ESTRADIOL 0.15-30 MG-MCG PO TABS: 1.0000 | ORAL_TABLET | Freq: Every day | ORAL | 3 refills | Status: DC

## 2019-12-25 DIAGNOSIS — J301 Allergic rhinitis due to pollen: Secondary | ICD-10-CM | POA: Diagnosis not present

## 2020-01-01 ENCOUNTER — Other Ambulatory Visit: Payer: Self-pay | Admitting: Family

## 2020-01-01 DIAGNOSIS — J301 Allergic rhinitis due to pollen: Secondary | ICD-10-CM | POA: Diagnosis not present

## 2020-01-08 DIAGNOSIS — J301 Allergic rhinitis due to pollen: Secondary | ICD-10-CM | POA: Diagnosis not present

## 2020-01-15 DIAGNOSIS — J301 Allergic rhinitis due to pollen: Secondary | ICD-10-CM | POA: Diagnosis not present

## 2020-01-22 DIAGNOSIS — J301 Allergic rhinitis due to pollen: Secondary | ICD-10-CM | POA: Diagnosis not present

## 2020-01-26 ENCOUNTER — Telehealth: Payer: Self-pay | Admitting: Family

## 2020-01-26 ENCOUNTER — Encounter: Payer: Self-pay | Admitting: Family

## 2020-01-26 ENCOUNTER — Other Ambulatory Visit: Payer: Self-pay

## 2020-01-26 ENCOUNTER — Ambulatory Visit: Payer: BC Managed Care – PPO | Admitting: Family

## 2020-01-26 VITALS — BP 116/80 | HR 89 | Temp 96.6°F | Ht 66.5 in | Wt 249.6 lb

## 2020-01-26 DIAGNOSIS — J4520 Mild intermittent asthma, uncomplicated: Secondary | ICD-10-CM | POA: Diagnosis not present

## 2020-01-26 DIAGNOSIS — F329 Major depressive disorder, single episode, unspecified: Secondary | ICD-10-CM | POA: Diagnosis not present

## 2020-01-26 DIAGNOSIS — E119 Type 2 diabetes mellitus without complications: Secondary | ICD-10-CM | POA: Diagnosis not present

## 2020-01-26 DIAGNOSIS — S61209A Unspecified open wound of unspecified finger without damage to nail, initial encounter: Secondary | ICD-10-CM | POA: Insufficient documentation

## 2020-01-26 DIAGNOSIS — F32A Depression, unspecified: Secondary | ICD-10-CM

## 2020-01-26 LAB — POCT GLYCOSYLATED HEMOGLOBIN (HGB A1C)

## 2020-01-26 MED ORDER — TRULICITY 0.75 MG/0.5ML ~~LOC~~ SOAJ: 0.7500 mg | SUBCUTANEOUS | 2 refills | Status: DC

## 2020-01-26 MED ORDER — ALBUTEROL SULFATE HFA 108 (90 BASE) MCG/ACT IN AERS: 2.0000 | INHALATION_SPRAY | Freq: Four times a day (QID) | RESPIRATORY_TRACT | 1 refills | Status: DC | PRN

## 2020-01-26 NOTE — Telephone Encounter (Signed)
Pt needs Dulaglutide (TRULICITY) 0.75 MG/0.5ML SOPN sent to [facility] on [address] and would like [employer] taking off of her record.

## 2020-01-26 NOTE — Progress Notes (Signed)
Subjective:    Patient ID: LAYLAMARIE MEUSER, female    DOB: 1996/08/06, 24 y.o.   MRN: [id number]  CC: JEFFIFER RABOLD is a 24 y.o. female who presents today for follow up.   HPI: Feels well today, no complaints.   DM 11- diagnosed 2013.  Compliant with Metformin although will forget a dose from time to time.  No blurry vision, polyuria.  This afternoon soda and sweet tea.  Does not check blood sugars at home.  No history personally or in family of thyroid cancer.  No trouble swallowing  Feels well on lexapro and memory has improved.  Excited about new job at [employer].   Asthma-controlled on Qvar.  hasnt used Ventolin in a long  time.  would like a refill today.  No wheezing or shortness of breath.  Compliant with Singulair.  Follows with allergy for allergy shots with Dr. Tami Ribas.    HISTORY:  Past Medical History:  Diagnosis Date  . allergies   . Allergy   . Asthma   . Depression   . Diabetes mellitus without complication ([place])    prediabetic   Past Surgical History:  Procedure Laterality Date  . ADENOIDECTOMY     1998- only adenoids   Family History  Problem Relation Age of Onset  . Rheum arthritis Maternal Grandmother   . Hyperlipidemia Maternal Grandmother   . Hypertension Maternal Grandmother   . Arthritis Maternal Grandmother   . Memory loss Maternal Grandmother   . Hyperlipidemia Mother   . Diabetes Brother   . Hyperlipidemia Maternal Grandfather   . Hypertension Maternal Grandfather   . Colon cancer Paternal Grandmother 56  . Dementia Paternal Great-grandmother   . Breast cancer Neg Hx   . Thyroid cancer Neg Hx     Allergies: Augmentin [amoxicillin-pot clavulanate] Current Outpatient Medications on File Prior to Visit  Medication Sig Dispense Refill  . desogestrel-ethinyl estradiol (APRI) 0.15-30 MG-MCG tablet Take 1 tablet by mouth daily. 84 tablet 3  . EPINEPHrine 0.3 mg/0.3 mL IJ SOAJ injection epinephrine 0.3 mg/0.3 mL injection, auto-injector  INJECT INTRAMUSCULARLY AS DIRECTED    . escitalopram (LEXAPRO) 10 MG tablet Take 1 tablet (10 mg total) by mouth daily. 90 tablet 1  . ibuprofen (ADVIL,MOTRIN) 800 MG tablet Take 1 tablet (800 mg total) by mouth every 8 (eight) hours as needed for moderate pain. 15 tablet 0  . levocetirizine (XYZAL) 5 MG tablet Take 5 mg by mouth every evening.    . metFORMIN (GLUCOPHAGE) 1000 MG tablet TAKE 1 TABLET(1000 MG) BY MOUTH TWICE DAILY WITH A MEAL 60 tablet 0  . montelukast (SINGULAIR) 10 MG tablet TAKE 1 TABLET(10 MG) BY MOUTH AT BEDTIME 90 tablet 3  . QVAR 80 MCG/ACT inhaler TAKE 2 PUFFS FIRST THING IN THE MORNING AND THEN  ANOTHER 2 PUFFS ABOUT 12  HOURS LATER. 3 Inhaler 0   No current facility-administered medications on file prior to visit.    Social History   Tobacco Use  . Smoking status: Never Smoker  . Smokeless tobacco: Never Used  Substance Use Topics  . Alcohol use: Yes    Alcohol/week: 0.0 standard drinks    Comment: rare  . Drug use: No    Review of Systems  Constitutional: Negative for chills and fever.  HENT: Negative for trouble swallowing.   Eyes: Negative for visual disturbance.  Respiratory: Negative for cough.   Cardiovascular: Negative for chest pain and palpitations.  Gastrointestinal: Negative for nausea and vomiting.  Endocrine:  Negative for polyuria.      Objective:    BP 116/80   Pulse 89   Temp (!) 96.6 F (35.9 C) (Temporal)   Ht 5' 6.5" (1.689 m)   Wt 249 lb 9.6 oz (113.2 kg)   SpO2 97%   BMI 39.68 kg/m  BP Readings from Last 3 Encounters:  01/26/20 116/80  11/30/18 (!) 142/85  10/28/18 (!) 132/92   Wt Readings from Last 3 Encounters:  01/26/20 249 lb 9.6 oz (113.2 kg)  10/27/19 245 lb (111.1 kg)  11/30/18 264 lb (119.7 kg)    Physical Exam Vitals reviewed.  Constitutional:      Appearance: She is well-developed.  Eyes:     Conjunctiva/sclera: Conjunctivae normal.  Neck:     Thyroid: No thyroid mass, thyromegaly or thyroid  tenderness.  Cardiovascular:     Rate and Rhythm: Normal rate and regular rhythm.     Pulses: Normal pulses.     Heart sounds: Normal heart sounds.  Pulmonary:     Effort: Pulmonary effort is normal.     Breath sounds: Normal breath sounds. No wheezing, rhonchi or rales.  Skin:    General: Skin is warm and dry.  Neurological:     Mental Status: She is alert.  Psychiatric:        Speech: Speech normal.        Behavior: Behavior normal.        Thought Content: Thought content normal.        Assessment & Plan:   Problem List Items Addressed This Visit      Respiratory   Asthma    Stable. Continue regimen        Endocrine   Diabetes mellitus without complication (HCC) - Primary    Uncontrolled. a1c 8.9.Contraindications to Trulicity. Will start. Follow-up in 3 months      Relevant Medications   Dulaglutide (TRULICITY) 0.75 MG/0.5ML SOPN   Other Relevant Orders   POCT HgB A1C (Completed)   Referral to Nutrition and Diabetes Services     Other   Depression    Stable. Continue lexapro.           I am having Marvis Repress start on Trulicity. I am also having her maintain her Qvar, EPINEPHrine, ibuprofen, montelukast, escitalopram, desogestrel-ethinyl estradiol, metFORMIN, and levocetirizine.   Meds ordered this encounter  Medications  . Dulaglutide (TRULICITY) 0.75 MG/0.5ML SOPN    Sig: Inject 0.75 mg into the skin once a week.    Dispense:  3 pen    Refill:  2    Order Specific Question:   Supervising Provider    Answer:   Sherlene Shams [[id number]]    Return precautions given.   Risks, benefits, and alternatives of the medications and treatment plan prescribed today were discussed, and patient expressed understanding.   Education regarding symptom management and diagnosis given to patient on AVS.  Continue to follow with Allegra Grana, FNP for routine health maintenance.   Marvis Repress and I agreed with plan.   Rennie Plowman, FNP

## 2020-01-26 NOTE — Assessment & Plan Note (Signed)
Stable.  Continue lexapro.  

## 2020-01-26 NOTE — Assessment & Plan Note (Addendum)
Uncontrolled. a1c 8.9.Contraindications to Trulicity. Will start. Follow-up in 3 months

## 2020-01-26 NOTE — Patient Instructions (Addendum)
Start trulicity  Make eye appointment  Referral to nutrition, please call the office in 1 week if you have not heard from [place] in regards to this appointment  Stay safe!   Carbohydrate Counting for Diabetes Mellitus, Adult  Carbohydrate counting is a method of keeping track of how many carbohydrates you eat. Eating carbohydrates naturally increases the amount of sugar (glucose) in the blood. Counting how many carbohydrates you eat helps keep your blood glucose within normal limits, which helps you manage your diabetes (diabetes mellitus). It is important to know how many carbohydrates you can safely have in each meal. This is different for every person. A diet and nutrition specialist (registered dietitian) can help you make a meal plan and calculate how many carbohydrates you should have at each meal and snack. Carbohydrates are found in the following foods:  Grains, such as breads and cereals.  Dried beans and soy products.  Starchy vegetables, such as potatoes, peas, and corn.  Fruit and fruit juices.  Milk and yogurt.  Sweets and snack foods, such as cake, cookies, candy, chips, and soft drinks. How do I count carbohydrates? There are two ways to count carbohydrates in food. You can use either of the methods or a combination of both. Reading "Nutrition Facts" on packaged food The "Nutrition Facts" list is included on the labels of almost all packaged foods and beverages in the [place] It includes:  The serving size.  Information about nutrients in each serving, including the grams (g) of carbohydrate per serving. To use the "Nutrition Facts":  Decide how many servings you will have.  Multiply the number of servings by the number of carbohydrates per serving.  The resulting number is the total amount of carbohydrates that you will be having. Learning standard serving sizes of other foods When you eat carbohydrate foods that are not packaged or do not include "Nutrition Facts"  on the label, you need to measure the servings in order to count the amount of carbohydrates:  Measure the foods that you will eat with a food scale or measuring cup, if needed.  Decide how many standard-size servings you will eat.  Multiply the number of servings by 15. Most carbohydrate-rich foods have about 15 g of carbohydrates per serving. ? For example, if you eat 8 oz (170 g) of strawberries, you will have eaten 2 servings and 30 g of carbohydrates (2 servings x 15 g = 30 g).  For foods that have more than one food mixed, such as soups and casseroles, you must count the carbohydrates in each food that is included. The following list contains standard serving sizes of common carbohydrate-rich foods. Each of these servings has about 15 g of carbohydrates:   hamburger bun or  English muffin.   oz (15 mL) syrup.   oz (14 g) jelly.  1 slice of bread.  1 six-inch tortilla.  3 oz (85 g) cooked rice or pasta.  4 oz (113 g) cooked dried beans.  4 oz (113 g) starchy vegetable, such as peas, corn, or potatoes.  4 oz (113 g) hot cereal.  4 oz (113 g) mashed potatoes or  of a large baked potato.  4 oz (113 g) canned or frozen fruit.  4 oz (120 mL) fruit juice.  4-6 crackers.  6 chicken nuggets.  6 oz (170 g) unsweetened dry cereal.  6 oz (170 g) plain fat-free yogurt or yogurt sweetened with artificial sweeteners.  8 oz (240 mL) milk.  8 oz (170  g) fresh fruit or one small piece of fruit.  24 oz (680 g) popped popcorn. Example of carbohydrate counting Sample meal  3 oz (85 g) chicken breast.  6 oz (170 g) brown rice.  4 oz (113 g) corn.  8 oz (240 mL) milk.  8 oz (170 g) strawberries with sugar-free whipped topping. Carbohydrate calculation 1. Identify the foods that contain carbohydrates: ? Rice. ? Corn. ? Milk. ? Strawberries. 2. Calculate how many servings you have of each food: ? 2 servings rice. ? 1 serving corn. ? 1 serving milk. ? 1 serving  strawberries. 3. Multiply each number of servings by 15 g: ? 2 servings rice x 15 g = 30 g. ? 1 serving corn x 15 g = 15 g. ? 1 serving milk x 15 g = 15 g. ? 1 serving strawberries x 15 g = 15 g. 4. Add together all of the amounts to find the total grams of carbohydrates eaten: ? 30 g + 15 g + 15 g + 15 g = 75 g of carbohydrates total. Summary  Carbohydrate counting is a method of keeping track of how many carbohydrates you eat.  Eating carbohydrates naturally increases the amount of sugar (glucose) in the blood.  Counting how many carbohydrates you eat helps keep your blood glucose within normal limits, which helps you manage your diabetes.  A diet and nutrition specialist (registered dietitian) can help you make a meal plan and calculate how many carbohydrates you should have at each meal and snack. This information is not intended to replace advice given to you by your health care provider. Make sure you discuss any questions you have with your health care provider. Document Revised: 05/03/2017 Document Reviewed: 03/22/2016 Elsevier Patient Education  [place].

## 2020-01-26 NOTE — Assessment & Plan Note (Signed)
Stable. Continue regimen. 

## 2020-01-27 ENCOUNTER — Other Ambulatory Visit: Payer: Self-pay

## 2020-01-27 ENCOUNTER — Encounter: Payer: Self-pay | Admitting: Family

## 2020-01-27 DIAGNOSIS — E119 Type 2 diabetes mellitus without complications: Secondary | ICD-10-CM

## 2020-01-27 MED ORDER — TRULICITY 0.75 MG/0.5ML ~~LOC~~ SOAJ: 0.7500 mg | SUBCUTANEOUS | 2 refills | Status: DC

## 2020-02-06 ENCOUNTER — Encounter: Payer: Self-pay | Admitting: Family

## 2020-02-09 ENCOUNTER — Telehealth: Payer: Self-pay | Admitting: Family

## 2020-02-09 NOTE — Telephone Encounter (Signed)
I called [facility] and left a vm regarding pt to sch for appt. Pt is alos going to call back if she has not heard anything.

## 2020-02-10 ENCOUNTER — Encounter: Payer: Self-pay | Admitting: Family

## 2020-02-10 ENCOUNTER — Other Ambulatory Visit: Payer: Self-pay

## 2020-02-10 DIAGNOSIS — J301 Allergic rhinitis due to pollen: Secondary | ICD-10-CM | POA: Diagnosis not present

## 2020-02-10 MED ORDER — ONETOUCH VERIO VI STRP: ORAL_STRIP | 12 refills | Status: DC

## 2020-02-10 MED ORDER — ONETOUCH ULTRASOFT LANCETS MISC: 12 refills | Status: DC

## 2020-02-10 MED ORDER — ONETOUCH VERIO REFLECT W/DEVICE KIT: 1.0000 | PACK | Freq: Every day | 0 refills | Status: DC

## 2020-02-12 DIAGNOSIS — J301 Allergic rhinitis due to pollen: Secondary | ICD-10-CM | POA: Diagnosis not present

## 2020-02-19 DIAGNOSIS — J301 Allergic rhinitis due to pollen: Secondary | ICD-10-CM | POA: Diagnosis not present

## 2020-02-20 ENCOUNTER — Encounter: Payer: Self-pay | Admitting: Dietician

## 2020-02-20 ENCOUNTER — Other Ambulatory Visit: Payer: Self-pay

## 2020-02-20 ENCOUNTER — Encounter: Payer: BC Managed Care – PPO | Attending: Family | Admitting: Dietician

## 2020-02-20 VITALS — Ht 66.0 in | Wt 245.3 lb

## 2020-02-20 DIAGNOSIS — E119 Type 2 diabetes mellitus without complications: Secondary | ICD-10-CM | POA: Diagnosis not present

## 2020-02-20 NOTE — Progress Notes (Signed)
Medical Nutrition Therapy: Visit start time: 1400  end time: 1500  Assessment:  Diagnosis: Type 2 diabetes Past medical history: asthma Psychosocial issues/ stress concerns: history of depression, taking meds  Preferred learning method:  Amy Olson   Current weight: 245.3lbs  Height: 5'6" Medications, supplements: reconciled list in medical record  Progress and evaluation:   Patient reports recent diagnosis of Type 2 DM, with recent HbA1C of 8.9%, up from 8.7% 10/2019. She is testing BGs at home with fasting results ranging 140-183, and post-prandial results ranging 180s-200s.  She has been working on weight loss for several months and has lost about 30lbs. She used meap prep service [employer] for some time but stopped when laid off from work.  She was drinking significant amount of regular soda and sweet tea but has stopped.  Some types of lettuce (or how treated) can cause stomach upset    She feels overwhelmed and confused when trying to shop for groceries and therefore has developed a strong dislike for shopping.   Physical activity: no regular physical activity at this time, does have occasional hip pain  Dietary Intake:  Usual eating pattern includes 2-3 meals and 0 snacks per day. Dining out frequency: 10-15 meals per week.  Breakfast: Starbucks coffee-- white choc mocha with skim milk, was eating cheese danish but has stopped; now coffee only Snack: none Lunch: usually out-- [place] sub or sandwich Snack: none Supper: usually out-- [employer], [place] food; sometimes rice krispies (favorite food) Snack: none Beverages: water, crystal light, powerade zero  Nutrition Care Education: Topics covered:  Basic nutrition: basic food groups, appropriate nutrient balance, appropriate meal and snack schedule, general nutrition guidelines    Weight control: importance of low sugar and low fat choices, healthy food portions, estimated energy needs for weight loss at 1500 kcal,  provided guidance for 45% CHO Advanced nutrition:  Options for reducing stress when grocery shopping such as online ordering, pre-made food lists; cooking and meal prep, dining out Diabetes: appropriate meal and snack schedule, appropriate carb intake and balance, healthy carb choices, role of fiber, protein, fat   Nutritional Diagnosis:  [place]-2.2 Altered nutrition-related laboratory As related to Type 2 Diabetes.  As evidenced by patient with recent HbA1C of 8.9%. [place]-3.3 Overweight/obesity As related to excess calories and inadequate physical activity.  As evidenced by patient with current BMI of 39.6, working on diet changes to promote weight loss.  Intervention:   Instruction and discussion as noted above.  Commended patient for positive changes she has already made.  Established goals for additional changes with direction from patient.   Education Materials given:  . General diet guidelines for Diabetes . Plate Planner with food lists, sample meal pattern . Sample menus . Goals/ instructions   Learner/ who was taught:  . Patient   Level of understanding: Marland Kitchen Verbalizes/ demonstrates competency   Demonstrated degree of understanding via:   Teach back Learning barriers: . None  Willingness to learn/ readiness for change: . Eager, change in progress  Monitoring and Evaluation:  Dietary intake, exercise, BG control, and body weight      follow up: 04/02/20 at 1:30pm

## 2020-02-20 NOTE — Patient Instructions (Signed)
   Try a meal prep app such as [employer] for meal ideas that include recipes and food lists.   Try online ordering and curbside pickup for groceries to avoid having to shop.   Work on eating more homemade meals and fewer restaurant meals.   Make gradual changes, such as eating 3-4 meals out rather than 5-6, or work on lunch meals from home and then later work on supper meals.

## 2020-03-04 DIAGNOSIS — J301 Allergic rhinitis due to pollen: Secondary | ICD-10-CM | POA: Diagnosis not present

## 2020-03-11 DIAGNOSIS — J301 Allergic rhinitis due to pollen: Secondary | ICD-10-CM | POA: Diagnosis not present

## 2020-03-25 DIAGNOSIS — J301 Allergic rhinitis due to pollen: Secondary | ICD-10-CM | POA: Diagnosis not present

## 2020-04-01 ENCOUNTER — Telehealth: Payer: Self-pay | Admitting: Dietician

## 2020-04-01 DIAGNOSIS — J301 Allergic rhinitis due to pollen: Secondary | ICD-10-CM | POA: Diagnosis not present

## 2020-04-01 NOTE — Telephone Encounter (Signed)
Returned message from patient to cancel her MNT follow-up appointment for 04/02/20. Unable to leave a message as voicemail is full.

## 2020-04-02 ENCOUNTER — Other Ambulatory Visit: Payer: Self-pay | Admitting: Family

## 2020-04-02 ENCOUNTER — Ambulatory Visit: Payer: BC Managed Care – PPO | Admitting: Dietician

## 2020-04-08 DIAGNOSIS — J301 Allergic rhinitis due to pollen: Secondary | ICD-10-CM | POA: Diagnosis not present

## 2020-04-14 ENCOUNTER — Other Ambulatory Visit: Payer: Self-pay | Admitting: Family

## 2020-04-14 DIAGNOSIS — J4520 Mild intermittent asthma, uncomplicated: Secondary | ICD-10-CM

## 2020-04-14 DIAGNOSIS — Z0000 Encounter for general adult medical examination without abnormal findings: Secondary | ICD-10-CM

## 2020-04-19 ENCOUNTER — Encounter: Payer: Self-pay | Admitting: Dietician

## 2020-04-19 NOTE — Progress Notes (Signed)
Have not heard back from patient to reschedule her cancelled appointment from 04/02/20. Sent notification to referring provider.

## 2020-04-22 DIAGNOSIS — J301 Allergic rhinitis due to pollen: Secondary | ICD-10-CM | POA: Diagnosis not present

## 2020-04-30 DIAGNOSIS — J301 Allergic rhinitis due to pollen: Secondary | ICD-10-CM | POA: Diagnosis not present

## 2020-05-03 ENCOUNTER — Encounter: Payer: Self-pay | Admitting: Family

## 2020-05-03 ENCOUNTER — Ambulatory Visit: Payer: BC Managed Care – PPO | Admitting: Family

## 2020-05-03 ENCOUNTER — Other Ambulatory Visit: Payer: Self-pay

## 2020-05-03 DIAGNOSIS — F32A Depression, unspecified: Secondary | ICD-10-CM

## 2020-05-03 DIAGNOSIS — N921 Excessive and frequent menstruation with irregular cycle: Secondary | ICD-10-CM

## 2020-05-03 DIAGNOSIS — F329 Major depressive disorder, single episode, unspecified: Secondary | ICD-10-CM

## 2020-05-03 DIAGNOSIS — R030 Elevated blood-pressure reading, without diagnosis of hypertension: Secondary | ICD-10-CM | POA: Diagnosis not present

## 2020-05-03 MED ORDER — METFORMIN HCL ER 500 MG PO TB24: 2000.0000 mg | ORAL_TABLET | Freq: Every evening | ORAL | 3 refills | Status: DC

## 2020-05-03 NOTE — Assessment & Plan Note (Signed)
Stable.  Continue lexapro.  

## 2020-05-03 NOTE — Progress Notes (Signed)
Subjective:    [name], female    DOB: 07/14/1996, 24 y.o.   MRN: [id number]  CC: Amy Olson is a 24 y.o. female who presents today for follow up.   HPI: Feels well today.   Elevated blood pressure. States family history of.  No nsaid or decongestant use Denies exertional chest pain or pressure, numbness or tingling radiating to left arm or jaw, palpitations, dizziness, frequent headaches, changes in vision, or shortness of breath.   Depression- feels well lexapro.   DM- has been missing doses of metformin.compliant with trulicity.  Has been doing random glucose checks, and feels like improved from prior. Notes last post prandial 147.    OCP- started due to painful menstrual cycles. compliant with medication. Has been cycling and not taking taking placebo except for every 3 months when she takes placebo and will have normal menses. LMP 3 weeks ago while cycling.  Has been cycling for due menses pain.  No pelvic pain. No concerns stds. Not currently sexually active.  No large clots during menses  No history of DVT, migraine with aura. Non smoker.         HISTORY:  Past Medical History:  Diagnosis Date  . allergies   . Allergy   . Asthma   . Depression   . Diabetes mellitus without complication ([place])    prediabetic   Past Surgical History:  Procedure Laterality Date  . ADENOIDECTOMY     1998- only adenoids   Family History  Problem Relation Age of Onset  . Rheum arthritis Maternal Grandmother   . Hyperlipidemia Maternal Grandmother   . Hypertension Maternal Grandmother   . Arthritis Maternal Grandmother   . Memory loss Maternal Grandmother   . Hyperlipidemia Mother   . Diabetes Brother   . Hyperlipidemia Maternal Grandfather   . Hypertension Maternal Grandfather   . Colon cancer Paternal Grandmother 67  . Dementia Paternal Great-grandmother   . Breast cancer Neg Hx   . Thyroid cancer Neg Hx     Allergies: Augmentin [amoxicillin-pot  clavulanate] Current Outpatient Medications on File Prior to Visit  Medication Sig Dispense Refill  . albuterol (PROAIR HFA) 108 (90 Base) MCG/ACT inhaler Inhale 2 puffs into the lungs every 6 (six) hours as needed for wheezing or shortness of breath. 18 g 1  . Blood Glucose Monitoring Suppl (ONETOUCH VERIO REFLECT) w/Device KIT 1 Device by Does not apply route daily. 1 kit 0  . desogestrel-ethinyl estradiol (APRI) 0.15-30 MG-MCG tablet Take 1 tablet by mouth daily. 84 tablet 3  . Dulaglutide (TRULICITY) 6.75 FF/6.3WG SOPN Inject 0.75 mg into the skin once a week. 3 pen 2  . EPINEPHrine 0.3 mg/0.3 mL IJ SOAJ injection epinephrine 0.3 mg/0.3 mL injection, auto-injector  INJECT INTRAMUSCULARLY AS DIRECTED    . escitalopram (LEXAPRO) 10 MG tablet Take 1 tablet (10 mg total) by mouth daily. 90 tablet 1  . glucose blood (ONETOUCH VERIO) test strip Used to check blood sugars twice daily. 100 each 12  . ibuprofen (ADVIL,MOTRIN) 800 MG tablet Take 1 tablet (800 mg total) by mouth every 8 (eight) hours as needed for moderate pain. 15 tablet 0  . Lancets (ONETOUCH ULTRASOFT) lancets Used to check blood sugars twice daily. 100 each 12  . levocetirizine (XYZAL) 5 MG tablet Take 5 mg by mouth every evening.    . montelukast (SINGULAIR) 10 MG tablet TAKE 1 TABLET(10 MG) BY MOUTH AT BEDTIME 90 tablet 3  . QVAR 80 MCG/ACT  inhaler TAKE 2 PUFFS FIRST THING IN THE MORNING AND THEN  ANOTHER 2 PUFFS ABOUT 12  HOURS LATER. 3 Inhaler 0   No current facility-administered medications on file prior to visit.    Social History   Tobacco Use  . Smoking status: Never Smoker  . Smokeless tobacco: Never Used  Vaping Use  . Vaping Use: Never used  Substance Use Topics  . Alcohol use: Yes    Alcohol/week: 1.0 - 2.0 standard drink    Types: 1 - 2 Standard drinks or equivalent per week    Comment: rare  . Drug use: No    Review of Systems  Constitutional: Negative for chills and fever.  Respiratory: Negative for  cough.   Cardiovascular: Negative for chest pain and palpitations.  Gastrointestinal: Negative for nausea and vomiting.  Genitourinary: Positive for menstrual problem. Negative for pelvic pain.      Objective:    BP (!) 120/100   Pulse 96   Temp 98.7 F (37.1 C)   Ht 5' 5.98" (1.676 m)   Wt 247 lb 12.8 oz (112.4 kg)   SpO2 98%   BMI 40.02 kg/m  BP Readings from Last 3 Encounters:  05/03/20 (!) 120/100  01/26/20 116/80  11/30/18 (!) 142/85   Wt Readings from Last 3 Encounters:  05/03/20 247 lb 12.8 oz (112.4 kg)  02/20/20 245 lb 4.8 oz (111.3 kg)  01/26/20 249 lb 9.6 oz (113.2 kg)    Physical Exam Vitals reviewed.  Constitutional:      Appearance: She is well-developed.  Eyes:     Conjunctiva/sclera: Conjunctivae normal.  Cardiovascular:     Rate and Rhythm: Normal rate and regular rhythm.     Pulses: Normal pulses.     Heart sounds: Normal heart sounds.  Pulmonary:     Effort: Pulmonary effort is normal.     Breath sounds: Normal breath sounds. No wheezing, rhonchi or rales.  Skin:    General: Skin is warm and dry.  Neurological:     Mental Status: She is alert.  Psychiatric:        Speech: Speech normal.        Behavior: Behavior normal.        Thought Content: Thought content normal.        Assessment & Plan:   Problem List Items Addressed This Visit      Other   Breakthrough bleeding on OCPs    Has been cycling with OCP. One occasion of breaktthrough. Patient will let me know if persistent as we have discussed iud       Depression    Stable. Continue lexapro      Elevated blood pressure reading    Last blood pressure well controlled. Patient understands importance of sending readings to me so we can assess for need of medication.          I have discontinued Amy Olson. Stys's metFORMIN. I am also having her start on metFORMIN. Additionally, I am having her maintain her Qvar, EPINEPHrine, ibuprofen, escitalopram, desogestrel-ethinyl  estradiol, levocetirizine, albuterol, Trulicity, OneTouch Verio Reflect, OneTouch Verio, onetouch ultrasoft, and montelukast.   Meds ordered this encounter  Medications  . metFORMIN (GLUCOPHAGE XR) 500 MG 24 hr tablet    Sig: Take 4 tablets (2,000 mg total) by mouth every evening.    Dispense:  120 tablet    Refill:  3    Order Specific Question:   Supervising Provider    Answer:   Crecencio Mc [  [id number]]    Return precautions given.   Risks, benefits, and alternatives of the medications and treatment plan prescribed today were discussed, and patient expressed understanding.   Education regarding symptom management and diagnosis given to patient on AVS.  Continue to follow with Burnard Hawthorne, FNP for routine health maintenance.   Rockwell [place] and I agreed with plan.   Mable Paris, FNP

## 2020-05-03 NOTE — Assessment & Plan Note (Signed)
Last blood pressure well controlled. Patient understands importance of sending readings to me so we can assess for need of medication.

## 2020-05-03 NOTE — Assessment & Plan Note (Addendum)
Has been cycling with OCP. One occasion of breaktthrough. Patient will let me know if persistent as we have discussed iud

## 2020-05-03 NOTE — Patient Instructions (Addendum)
Let me know if abnormal periods continue as I would advise consideration of IUD and consult with GYN  Monitor blood pressure at home and me 5-6 reading on separate days. Goal is less than 120/80, based on newest guidelines, however we certainly want to be less than 130/80;  if persistently higher, please make sooner follow up appointment so we can recheck you blood pressure and manage/ adjust medications.  Nice to see you!   Managing Your Hypertension Hypertension is commonly called high blood pressure. This is when the force of your blood pressing against the walls of your arteries is too strong. Arteries are blood vessels that carry blood from your heart throughout your body. Hypertension forces the heart to work harder to pump blood, and may cause the arteries to become narrow or stiff. Having untreated or uncontrolled hypertension can cause heart attack, stroke, kidney disease, and other problems. What are blood pressure readings? A blood pressure reading consists of a higher number over a lower number. Ideally, your blood pressure should be below 120/80. The first ("top") number is called the systolic pressure. It is a measure of the pressure in your arteries as your heart beats. The second ("bottom") number is called the diastolic pressure. It is a measure of the pressure in your arteries as the heart relaxes. What does my blood pressure reading mean? Blood pressure is classified into four stages. Based on your blood pressure reading, your health care provider may use the following stages to determine what type of treatment you need, if any. Systolic pressure and diastolic pressure are measured in a unit called mm Hg. Normal  Systolic pressure: below 120.  Diastolic pressure: below 80. Elevated  Systolic pressure: 120-129.  Diastolic pressure: below 80. Hypertension stage 1  Systolic pressure: 130-139.  Diastolic pressure: 80-89. Hypertension stage 2  Systolic pressure: 140 or  above.  Diastolic pressure: 90 or above. What health risks are associated with hypertension? Managing your hypertension is an important responsibility. Uncontrolled hypertension can lead to:  A heart attack.  A stroke.  A weakened blood vessel (aneurysm).  Heart failure.  Kidney damage.  Eye damage.  Metabolic syndrome.  Memory and concentration problems. What changes can I make to manage my hypertension? Hypertension can be managed by making lifestyle changes and possibly by taking medicines. Your health care provider will help you make a plan to bring your blood pressure within a normal range. Eating and drinking   Eat a diet that is high in fiber and potassium, and low in salt (sodium), added sugar, and fat. An example eating plan is called the DASH (Dietary Approaches to Stop Hypertension) diet. To eat this way: ? Eat plenty of fresh fruits and vegetables. Try to fill half of your plate at each meal with fruits and vegetables. ? Eat whole grains, such as whole wheat pasta, brown rice, or whole grain bread. Fill about one quarter of your plate with whole grains. ? Eat low-fat diary products. ? Avoid fatty cuts of meat, processed or cured meats, and poultry with skin. Fill about one quarter of your plate with lean proteins such as fish, chicken without skin, beans, eggs, and tofu. ? Avoid premade and processed foods. These tend to be higher in sodium, added sugar, and fat.  Reduce your daily sodium intake. Most people with hypertension should eat less than 1,500 mg of sodium a day.  Limit alcohol intake to no more than 1 drink a day for nonpregnant women and 2 drinks a day  for men. One drink equals 12 oz of beer, 5 oz of wine, or 1 oz of hard liquor. Lifestyle  Work with your health care provider to maintain a healthy body weight, or to lose weight. Ask what an ideal weight is for you.  Get at least 30 minutes of exercise that causes your heart to beat faster (aerobic  exercise) most days of the week. Activities may include walking, swimming, or biking.  Include exercise to strengthen your muscles (resistance exercise), such as weight lifting, as part of your weekly exercise routine. Try to do these types of exercises for 30 minutes at least 3 days a week.  Do not use any products that contain nicotine or tobacco, such as cigarettes and e-cigarettes. If you need help quitting, ask your health care provider.  Control any long-term (chronic) conditions you have, such as high cholesterol or diabetes. Monitoring  Monitor your blood pressure at home as told by your health care provider. Your personal target blood pressure may vary depending on your medical conditions, your age, and other factors.  Have your blood pressure checked regularly, as often as told by your health care provider. Working with your health care provider  Review all the medicines you take with your health care provider because there may be side effects or interactions.  Talk with your health care provider about your diet, exercise habits, and other lifestyle factors that may be contributing to hypertension.  Visit your health care provider regularly. Your health care provider can help you create and adjust your plan for managing hypertension. Will I need medicine to control my blood pressure? Your health care provider may prescribe medicine if lifestyle changes are not enough to get your blood pressure under control, and if:  Your systolic blood pressure is 130 or higher.  Your diastolic blood pressure is 80 or higher. Take medicines only as told by your health care provider. Follow the directions carefully. Blood pressure medicines must be taken as prescribed. The medicine does not work as well when you skip doses. Skipping doses also puts you at risk for problems. Contact a health care provider if:  You think you are having a reaction to medicines you have taken.  You have repeated  (recurrent) headaches.  You feel dizzy.  You have swelling in your ankles.  You have trouble with your vision. Get help right away if:  You develop a severe headache or confusion.  You have unusual weakness or numbness, or you feel faint.  You have severe pain in your chest or abdomen.  You vomit repeatedly.  You have trouble breathing. Summary  Hypertension is when the force of blood pumping through your arteries is too strong. If this condition is not controlled, it may put you at risk for serious complications.  Your personal target blood pressure may vary depending on your medical conditions, your age, and other factors. For most people, a normal blood pressure is less than 120/80.  Hypertension is managed by lifestyle changes, medicines, or both. Lifestyle changes include weight loss, eating a healthy, low-sodium diet, exercising more, and limiting alcohol. This information is not intended to replace advice given to you by your health care provider. Make sure you discuss any questions you have with your health care provider. Document Revised: 01/31/2019 Document Reviewed: 09/06/2016 Elsevier Patient Education  2020 [employer].

## 2020-05-05 ENCOUNTER — Other Ambulatory Visit: Payer: Self-pay | Admitting: Family

## 2020-05-05 DIAGNOSIS — R413 Other amnesia: Secondary | ICD-10-CM

## 2020-05-05 DIAGNOSIS — F418 Other specified anxiety disorders: Secondary | ICD-10-CM

## 2020-05-17 DIAGNOSIS — L300 Nummular dermatitis: Secondary | ICD-10-CM | POA: Diagnosis not present

## 2020-05-17 DIAGNOSIS — L720 Epidermal cyst: Secondary | ICD-10-CM | POA: Diagnosis not present

## 2020-05-17 DIAGNOSIS — L988 Other specified disorders of the skin and subcutaneous tissue: Secondary | ICD-10-CM | POA: Diagnosis not present

## 2020-05-27 DIAGNOSIS — J301 Allergic rhinitis due to pollen: Secondary | ICD-10-CM | POA: Diagnosis not present

## 2020-06-03 DIAGNOSIS — J301 Allergic rhinitis due to pollen: Secondary | ICD-10-CM | POA: Diagnosis not present

## 2020-06-10 DIAGNOSIS — J301 Allergic rhinitis due to pollen: Secondary | ICD-10-CM | POA: Diagnosis not present

## 2020-06-14 DIAGNOSIS — J309 Allergic rhinitis, unspecified: Secondary | ICD-10-CM | POA: Diagnosis not present

## 2020-06-17 DIAGNOSIS — J301 Allergic rhinitis due to pollen: Secondary | ICD-10-CM | POA: Diagnosis not present

## 2020-06-24 DIAGNOSIS — J301 Allergic rhinitis due to pollen: Secondary | ICD-10-CM | POA: Diagnosis not present

## 2020-06-29 DIAGNOSIS — L300 Nummular dermatitis: Secondary | ICD-10-CM | POA: Diagnosis not present

## 2020-06-29 DIAGNOSIS — L988 Other specified disorders of the skin and subcutaneous tissue: Secondary | ICD-10-CM | POA: Diagnosis not present

## 2020-07-01 DIAGNOSIS — J301 Allergic rhinitis due to pollen: Secondary | ICD-10-CM | POA: Diagnosis not present

## 2020-07-18 ENCOUNTER — Other Ambulatory Visit: Payer: Self-pay | Admitting: Family

## 2020-07-18 DIAGNOSIS — E119 Type 2 diabetes mellitus without complications: Secondary | ICD-10-CM

## 2020-07-22 ENCOUNTER — Ambulatory Visit
Admission: EM | Admit: 2020-07-22 | Discharge: 2020-07-22 | Disposition: A | Discharge status: Home / Self Care | Payer: BC Managed Care – PPO | Attending: Emergency Medicine | Admitting: Emergency Medicine

## 2020-07-22 DIAGNOSIS — R21 Rash and other nonspecific skin eruption: Secondary | ICD-10-CM | POA: Diagnosis not present

## 2020-07-22 MED ORDER — PERMETHRIN 5 % EX CREA: TOPICAL_CREAM | CUTANEOUS | 0 refills | Status: DC

## 2020-07-22 NOTE — ED Triage Notes (Signed)
Patient reports she developed a rash one week ago after returning from vacation. Reports that her brother has had a rash for 2 months; he was recently informed there was a chance it could be scabies, but testing for it came back inconclusive.

## 2020-07-22 NOTE — ED Provider Notes (Signed)
Amy Olson    CSN: [id number] Arrival date & time: 07/22/20  1235      History   Chief Complaint Chief Complaint  Patient presents with  . Rash    HPI Amy Olson is a 24 y.o. female.   Patient presents with 1 week history of pruritic papular rash on her ankles which is now spreading to her legs and arms.  She states her brother was diagnosed with possible scabies last week.  She denies fever, chills, sore throat, cough, shortness of breath, vomiting, diarrhea, or other symptoms.  No treatments attempted at home.  The history is provided by the patient.    Past Medical History:  Diagnosis Date  . allergies   . Allergy   . Asthma   . Depression   . Diabetes mellitus without complication [facility])    prediabetic    Patient Active Problem List   Diagnosis Date Noted  . Breakthrough bleeding on OCPs 05/03/2020  . Elevated blood pressure reading 05/03/2020  . Open wound of finger 01/26/2020  . Diabetes mellitus without complication ([facility]) 21/30/8657  . Depression 12/04/2015  . Asthma 09/15/2013    Past Surgical History:  Procedure Laterality Date  . ADENOIDECTOMY     1998- only adenoids    OB History   No obstetric history on file.      Home Medications    Prior to Admission medications   Medication Sig Start Date End Date Taking? Authorizing Provider  desogestrel-ethinyl estradiol (APRI) 0.15-30 MG-MCG tablet Take 1 tablet by mouth daily. 12/24/19  Yes Burnard Hawthorne, FNP  escitalopram (LEXAPRO) 10 MG tablet TAKE 1 TABLET(10 MG) BY MOUTH DAILY 05/05/20  Yes Arnett, Yvetta Coder, FNP  levocetirizine (XYZAL) 5 MG tablet Take 5 mg by mouth every evening.   Yes [provider]  metFORMIN (GLUCOPHAGE XR) 500 MG 24 hr tablet Take 4 tablets (2,000 mg total) by mouth every evening. 05/03/20  Yes Burnard Hawthorne, FNP  montelukast (SINGULAIR) 10 MG tablet TAKE 1 TABLET(10 MG) BY MOUTH AT BEDTIME 04/14/20  Yes Arnett, Yvetta Coder, FNP  QVAR 80 MCG/ACT  inhaler TAKE 2 PUFFS FIRST THING IN THE MORNING AND THEN  ANOTHER 2 PUFFS ABOUT 12  HOURS LATER. 11/13/16  Yes Tanda Rockers, MD  TRULICITY 8.46 NG/2.9BM SOPN ADMINISTER 0.75 MG UNDER THE SKIN 1 TIME A WEEK 07/19/20  Yes Burnard Hawthorne, FNP  albuterol (PROAIR HFA) 108 (90 Base) MCG/ACT inhaler Inhale 2 puffs into the lungs every 6 (six) hours as needed for wheezing or shortness of breath. 01/26/20   Burnard Hawthorne, FNP  Blood Glucose Monitoring Suppl (ONETOUCH VERIO REFLECT) w/Device KIT 1 Device by Does not apply route daily. 02/10/20   Burnard Hawthorne, FNP  EPINEPHrine 0.3 mg/0.3 mL IJ SOAJ injection epinephrine 0.3 mg/0.3 mL injection, auto-injector  INJECT INTRAMUSCULARLY AS DIRECTED    [provider]  glucose blood (ONETOUCH VERIO) test strip Used to check blood sugars twice daily. 02/10/20   Burnard Hawthorne, FNP  ibuprofen (ADVIL,MOTRIN) 800 MG tablet Take 1 tablet (800 mg total) by mouth every 8 (eight) hours as needed for moderate pain. 11/30/18   Paulette Blanch, MD  Lancets [facility] ULTRASOFT) lancets Used to check blood sugars twice daily. 02/10/20   Burnard Hawthorne, FNP  permethrin Nancy Fetter) 5 % cream Apply to affected area once 07/22/20   Sharion Balloon, NP    Family History Family History  Problem Relation Age of Onset  .  Rheum arthritis Maternal Grandmother   . Hyperlipidemia Maternal Grandmother   . Hypertension Maternal Grandmother   . Arthritis Maternal Grandmother   . Memory loss Maternal Grandmother   . Hyperlipidemia Mother   . Diabetes Brother   . Hyperlipidemia Maternal Grandfather   . Hypertension Maternal Grandfather   . Colon cancer Paternal Grandmother 55  . Dementia Paternal Great-grandmother   . Breast cancer Neg Hx   . Thyroid cancer Neg Hx     Social History Social History   Tobacco Use  . Smoking status: Never Smoker  . Smokeless tobacco: Never Used  Vaping Use  . Vaping Use: Never used  Substance Use Topics  . Alcohol use: Yes     Alcohol/week: 1.0 - 2.0 standard drink    Types: 1 - 2 Standard drinks or equivalent per week    Comment: rare  . Drug use: No     Allergies   Augmentin [amoxicillin-pot clavulanate]   Review of Systems Review of Systems  Constitutional: Negative for chills and fever.  HENT: Negative for ear pain and sore throat.   Eyes: Negative for pain and visual disturbance.  Respiratory: Negative for cough and shortness of breath.   Cardiovascular: Negative for chest pain and palpitations.  Gastrointestinal: Negative for abdominal pain and vomiting.  Genitourinary: Negative for dysuria and hematuria.  Musculoskeletal: Negative for arthralgias and back pain.  Skin: Positive for rash. Negative for color change.  Neurological: Negative for seizures and syncope.  All other systems reviewed and are negative.    Physical Exam Triage Vital Signs ED Triage Vitals  Enc Vitals Group     BP 07/22/20 1351 (!) 135/94     Pulse Rate 07/22/20 1351 87     Resp 07/22/20 1351 16     Temp 07/22/20 1351 99.1 F (37.3 C)     Temp src --      SpO2 07/22/20 1351 97 %     Weight --      Height --      Head Circumference --      Peak Flow --      Pain Score 07/22/20 1348 0     Pain Loc --      Pain Edu? --      Excl. in [place]? --    No data found.  Updated Vital Signs BP (!) 135/94   Pulse 87   Temp 99.1 F (37.3 C)   Resp 16   SpO2 97%   Visual Acuity Right Eye Distance:   Left Eye Distance:   Bilateral Distance:    Right Eye Near:   Left Eye Near:    Bilateral Near:     Physical Exam Vitals and nursing note reviewed.  Constitutional:      General: She is not in acute distress.    Appearance: She is well-developed.  HENT:     Head: Normocephalic and atraumatic.     Mouth/Throat:     Mouth: Mucous membranes are moist.  Eyes:     Conjunctiva/sclera: Conjunctivae normal.  Cardiovascular:     Rate and Rhythm: Normal rate and regular rhythm.     Heart sounds: No murmur heard.    Pulmonary:     Effort: Pulmonary effort is normal. No respiratory distress.     Breath sounds: Normal breath sounds.  Abdominal:     Palpations: Abdomen is soft.     Tenderness: There is no abdominal tenderness.  Musculoskeletal:     Cervical back: Neck supple.  Skin:    General: Skin is warm and dry.     Findings: Rash present.     Comments: Red papular rash on ankles.  No drainage.  Neurological:     General: No focal deficit present.     Mental Status: She is alert and oriented to person, place, and time.     Gait: Gait normal.  Psychiatric:        Mood and Affect: Mood normal.        Behavior: Behavior normal.      UC Treatments / Results  Labs (all labs ordered are listed, but only abnormal results are displayed) Labs Reviewed - No data to display  EKG   Radiology No results found.  Procedures Procedures (including critical care time)  Medications Ordered in UC Medications - No data to display  Initial Impression / Assessment and Plan / UC Course  I have reviewed the triage vital signs and the nursing notes.  Pertinent labs & imaging results that were available during my care of the patient were reviewed by me and considered in my medical decision making (see chart for details).   Rash, likely scabies.  Treating with permethrin cream.  Detailed instructions for use discussed.  Also discussed environmental treatment.  Education provided.  Instructed her to follow-up with her PCP or dermatologist if she is not improving.  Patient agrees to plan of care.   Final Clinical Impressions(s) / UC Diagnoses   Final diagnoses:  Rash     Discharge Instructions     Apply the permethrin cream from your scalp to the soles of your feet.  Avoid your eyes, nose, mouth, and genitalia.  Leave on for 8 to 14 hours then wash it off with warm water.  See the attached information on additional treatment of your environment during this time.  You may repeat treatment with the  permethrin in 14 days if needed.    Follow-up with your primary care provider or a dermatologist if you are not improving.        ED Prescriptions    Medication Sig Dispense Auth. Provider   permethrin (ELIMITE) 5 % cream Apply to affected area once 60 g Sharion Balloon, NP     PDMP not reviewed this encounter.   Sharion Balloon, NP 07/22/20 1447

## 2020-07-22 NOTE — Discharge Instructions (Signed)
Apply the permethrin cream from your scalp to the soles of your feet.  Avoid your eyes, nose, mouth, and genitalia.  Leave on for 8 to 14 hours then wash it off with warm water.  See the attached information on additional treatment of your environment during this time.  You may repeat treatment with the permethrin in 14 days if needed.    Follow-up with your primary care provider or a dermatologist if you are not improving.

## 2020-08-03 ENCOUNTER — Telehealth: Payer: Self-pay

## 2020-08-03 ENCOUNTER — Telehealth: Payer: Self-pay | Admitting: Family

## 2020-08-03 ENCOUNTER — Other Ambulatory Visit: Payer: Self-pay

## 2020-08-03 NOTE — Telephone Encounter (Signed)
Tried to reach patient to reschedule appointment for 10/12 but it goes straight to voicemail and mailbox is full. If she calls back reschedule appointment. Sent MyChart message to patient.

## 2020-09-06 ENCOUNTER — Telehealth (INDEPENDENT_AMBULATORY_CARE_PROVIDER_SITE_OTHER): Payer: PRIVATE HEALTH INSURANCE | Admitting: Family

## 2020-09-06 ENCOUNTER — Encounter: Payer: Self-pay | Admitting: Family

## 2020-09-06 VITALS — Ht 66.0 in | Wt 240.0 lb

## 2020-09-06 DIAGNOSIS — F32A Depression, unspecified: Secondary | ICD-10-CM

## 2020-09-06 DIAGNOSIS — R030 Elevated blood-pressure reading, without diagnosis of hypertension: Secondary | ICD-10-CM | POA: Diagnosis not present

## 2020-09-06 DIAGNOSIS — N921 Excessive and frequent menstruation with irregular cycle: Secondary | ICD-10-CM

## 2020-09-06 DIAGNOSIS — E119 Type 2 diabetes mellitus without complications: Secondary | ICD-10-CM | POA: Diagnosis not present

## 2020-09-06 DIAGNOSIS — Z1159 Encounter for screening for other viral diseases: Secondary | ICD-10-CM

## 2020-09-06 NOTE — Progress Notes (Signed)
Virtual Visit via Video Note  I connected with@  on 09/06/20 at  4:00 PM EST by a video enabled telemedicine application and verified that I am speaking with the correct person using two identifiers.  Location patient: home Location provider:work  Persons participating in the virtual visit: patient, provider  I discussed the limitations of evaluation and management by telemedicine and the availability of in person appointments. The patient expressed understanding and agreed to proceed.   HPI:  Feels well today No new complaints  Continues to have breakthrough bleeding week prior expected menses every 3 months,   when cycles for 3 months. She is interested in GYN.   She has been doing well otherwise.   Depression-compliant with lexapro. Feels dose is adequate.  No si/hi.   Dm- compliant with trulicity , not sure of dose;  Metformin 2000 mg.  Doesn't recall last eye exam.   Has checked blood pressure at home and told 'fine'.  She will send these readings through mychart.  No sob, cp.    ROS: See pertinent positives and negatives per HPI.    EXAM:  VITALS per patient if applicable: Ht 5\' 6"  (1.676 m)   Wt 240 lb (108.9 kg)   BMI 38.74 kg/m  BP Readings from Last 3 Encounters:  07/22/20 (!) 135/94  05/03/20 (!) 120/100  01/26/20 116/80   Wt Readings from Last 3 Encounters:  09/06/20 240 lb (108.9 kg)  05/03/20 247 lb 12.8 oz (112.4 kg)  02/20/20 245 lb 4.8 oz (111.3 kg)    GENERAL: alert, oriented, appears well and in no acute distress  HEENT: atraumatic, conjunttiva clear, no obvious abnormalities on inspection of external nose and ears  NECK: normal movements of the head and neck  LUNGS: on inspection no signs of respiratory distress, breathing rate appears normal, no obvious gross SOB, gasping or wheezing  CV: no obvious cyanosis  MS: moves all visible extremities without noticeable abnormality  PSYCH/NEURO: pleasant and cooperative, no obvious depression  or anxiety, speech and thought processing grossly intact  ASSESSMENT AND PLAN:  Discussed the following assessment and plan:  Problem List Items Addressed This Visit      Endocrine   Diabetes mellitus without complication (HCC)    Anticipate improved. Pending a1c. Continue trulicity 0.75mg  qwk and metformin 2000mg       Relevant Orders   Hemoglobin A1c   Microalbumin / creatinine urine ratio     Other   Breakthrough bleeding on OCPs   Relevant Orders   Ambulatory referral to Obstetrics / Gynecology   Depression - Primary    Stable, continue lexapro 10mg .       Elevated blood pressure reading    Had been elevated in the past. Patient is to send me readings. In setting of diabetes, will discuss starting ARB.       Other Visit Diagnoses    Encounter for hepatitis C screening test for low risk patient       Relevant Orders   Hepatitis C antibody      -we discussed possible serious and likely etiologies, options for evaluation and workup, limitations of telemedicine visit vs in person visit, treatment, treatment risks and precautions. Pt prefers to treat via telemedicine empirically rather then risking or undertaking an in person visit at this moment.  .   I discussed the assessment and treatment plan with the patient. The patient was provided an opportunity to ask questions and all were answered. The patient agreed with the plan and demonstrated  an understanding of the instructions.   The patient was advised to call back or seek an in-person evaluation if the symptoms worsen or if the condition fails to improve as anticipated.   Mable Paris, FNP

## 2020-09-06 NOTE — Assessment & Plan Note (Signed)
Had been elevated in the past. Patient is to send me readings. In setting of diabetes, will discuss starting ARB.

## 2020-09-06 NOTE — Assessment & Plan Note (Signed)
Anticipate improved. Pending a1c. Continue trulicity 0.75mg  qwk and metformin 2000mg 

## 2020-09-06 NOTE — Assessment & Plan Note (Addendum)
Stable, continue lexapro 10mg 

## 2020-09-14 NOTE — Telephone Encounter (Signed)
I tried to call patient again, but VM was full & could not leave message.

## 2020-10-20 ENCOUNTER — Other Ambulatory Visit: Payer: Self-pay | Admitting: Family

## 2020-10-26 ENCOUNTER — Ambulatory Visit (INDEPENDENT_AMBULATORY_CARE_PROVIDER_SITE_OTHER): Payer: PRIVATE HEALTH INSURANCE | Admitting: Obstetrics and Gynecology

## 2020-10-26 ENCOUNTER — Encounter: Payer: Self-pay | Admitting: Obstetrics and Gynecology

## 2020-10-26 ENCOUNTER — Other Ambulatory Visit: Payer: Self-pay

## 2020-10-26 VITALS — BP 132/96 | HR 97 | Ht 66.0 in | Wt 246.3 lb

## 2020-10-26 DIAGNOSIS — N921 Excessive and frequent menstruation with irregular cycle: Secondary | ICD-10-CM | POA: Diagnosis not present

## 2020-10-26 MED ORDER — LEVONORGEST-ETH ESTRAD 91-DAY 0.15-0.03 &0.01 MG PO TABS: 1.0000 | ORAL_TABLET | Freq: Every day | ORAL | 1 refills | Status: DC

## 2020-10-26 NOTE — Progress Notes (Signed)
HPI:      Ms. WINDY DUDEK is a 25 y.o. No obstetric history on file. who LMP was Patient's last menstrual period was 09/29/2020.  Subjective:   She presents today because after changing her OCPs to a different brand she began having prolonged breakthrough bleeding.  She would like to change pills to a pill that allows her to have her menses every 3 months and also not have breakthrough bleeding between periods. She is not currently using OCPs for birth control but has significant dysmenorrhea and OCPs have been the solution.    Hx: The following portions of the patient's history were reviewed and updated as appropriate:             She  has a past medical history of allergies, Allergy, Asthma, Depression, and Diabetes mellitus without complication ([place]). She does not have any pertinent problems on file. She  has a past surgical history that includes Adenoidectomy. Her family history includes Arthritis in her maternal grandmother; Colon cancer (age of onset: 64) in her paternal grandmother; Dementia in her paternal great-grandmother; Diabetes in her brother; Hyperlipidemia in her maternal grandfather, maternal grandmother, and mother; Hypertension in her maternal grandfather and maternal grandmother; Memory loss in her maternal grandmother; Rheum arthritis in her maternal grandmother. She  reports that she has never smoked. She has never used smokeless tobacco. She reports current alcohol use of about 1.0 - 2.0 standard drink of alcohol per week. She reports that she does not use drugs. She has a current medication list which includes the following prescription(s): albuterol, onetouch verio reflect, epinephrine, escitalopram, onetouch verio, isibloom, onetouch ultrasoft, levocetirizine, levonorgestrel-ethinyl estradiol, metformin, montelukast, qvar, and trulicity. She is allergic to augmentin [amoxicillin-pot clavulanate].       Review of Systems:  Review of Systems  Constitutional: Denied  constitutional symptoms, night sweats, recent illness, fatigue, fever, insomnia and weight loss.  Eyes: Denied eye symptoms, eye pain, photophobia, vision change and visual disturbance.  Ears/Nose/Throat/Neck: Denied ear, nose, throat or neck symptoms, hearing loss, nasal discharge, sinus congestion and sore throat.  Cardiovascular: Denied cardiovascular symptoms, arrhythmia, chest pain/pressure, edema, exercise intolerance, orthopnea and palpitations.  Respiratory: Denied pulmonary symptoms, asthma, pleuritic pain, productive sputum, cough, dyspnea and wheezing.  Gastrointestinal: Denied, gastro-esophageal reflux, melena, nausea and vomiting.  Genitourinary: See HPI for additional information.  Musculoskeletal: Denied musculoskeletal symptoms, stiffness, swelling, muscle weakness and myalgia.  Dermatologic: Denied dermatology symptoms, rash and scar.  Neurologic: Denied neurology symptoms, dizziness, headache, neck pain and syncope.  Psychiatric: Denied psychiatric symptoms, anxiety and depression.  Endocrine: Denied endocrine symptoms including hot flashes and night sweats.   Meds:   Current Outpatient Medications on File Prior to Visit  Medication Sig Dispense Refill  . albuterol (PROAIR HFA) 108 (90 Base) MCG/ACT inhaler Inhale 2 puffs into the lungs every 6 (six) hours as needed for wheezing or shortness of breath. 18 g 1  . Blood Glucose Monitoring Suppl (ONETOUCH VERIO REFLECT) w/Device KIT 1 Device by Does not apply route daily. 1 kit 0  . EPINEPHrine 0.3 mg/0.3 mL IJ SOAJ injection epinephrine 0.3 mg/0.3 mL injection, auto-injector  INJECT INTRAMUSCULARLY AS DIRECTED    . escitalopram (LEXAPRO) 10 MG tablet TAKE 1 TABLET(10 MG) BY MOUTH DAILY 90 tablet 1  . glucose blood (ONETOUCH VERIO) test strip Used to check blood sugars twice daily. 100 each 12  . ISIBLOOM 0.15-30 MG-MCG tablet TAKE 1 TABLET BY MOUTH DAILY 84 tablet 3  . Lancets (ONETOUCH ULTRASOFT) lancets Used to check  blood  sugars twice daily. 100 each 12  . levocetirizine (XYZAL) 5 MG tablet Take 5 mg by mouth every evening.    . metFORMIN (GLUCOPHAGE-XR) 500 MG 24 hr tablet TAKE 4 TABLETS(2000 MG) BY MOUTH EVERY EVENING 120 tablet 3  . montelukast (SINGULAIR) 10 MG tablet TAKE 1 TABLET(10 MG) BY MOUTH AT BEDTIME 90 tablet 3  . QVAR 80 MCG/ACT inhaler TAKE 2 PUFFS FIRST THING IN THE MORNING AND THEN  ANOTHER 2 PUFFS ABOUT 12  HOURS LATER. 3 Inhaler 0  . TRULICITY 4.09 WJ/1.9JY SOPN ADMINISTER 0.75 MG UNDER THE SKIN 1 TIME A WEEK 2 mL 2   No current facility-administered medications on file prior to visit.       The pregnancy intention screening data noted above was reviewed. Potential methods of contraception were discussed. The patient elected to proceed with Oral Contraceptive.     Objective:     Vitals:   10/26/20 1436  BP: (!) 132/96  Pulse: 97   Filed Weights   10/26/20 1436  Weight: 246 lb 4.8 oz (111.7 kg)                Assessment:    No obstetric history on file. Patient Active Problem List   Diagnosis Date Noted  . Breakthrough bleeding on OCPs 05/03/2020  . Elevated blood pressure reading 05/03/2020  . Open wound of finger 01/26/2020  . Diabetes mellitus without complication ([place]) 78/29/5621  . Depression 12/04/2015  . Asthma 09/15/2013     1. Breakthrough bleeding on OCPs        Plan:            1.  Have discussed changing OCPs to a more potent progesterone or a pill that is designed for skipping menses.  She is chosen to begin [employer].  Risks and benefits of OCPs discussed.  Other methods of birth control/cycle control weekly discussed.  All questions answered. Orders No orders of the defined types were placed in this encounter.    Meds ordered this encounter  Medications  . Levonorgestrel-Ethinyl Estradiol (AMETHIA) 0.15-0.03 &0.01 MG tablet    Sig: Take 1 tablet by mouth at bedtime.    Dispense:  84 tablet    Refill:  1      F/U  Return in about 3 months  (around 01/24/2021) for Annual Physical. I spent 31 minutes involved in the care of this patient preparing to see the patient by obtaining and reviewing her medical history (including labs, imaging tests and prior procedures), documenting clinical information in the electronic health record (EHR), counseling and coordinating care plans, writing and sending prescriptions, ordering tests or procedures and directly communicating with the patient by discussing pertinent items from her history and physical exam as well as detailing my assessment and plan as noted above so that she has an informed understanding.  All of her questions were answered.  Finis Bud, M.D. 10/26/2020 2:55 PM

## 2020-11-22 ENCOUNTER — Other Ambulatory Visit: Payer: Self-pay | Admitting: Family

## 2020-11-22 DIAGNOSIS — R413 Other amnesia: Secondary | ICD-10-CM

## 2020-11-22 DIAGNOSIS — F418 Other specified anxiety disorders: Secondary | ICD-10-CM

## 2020-12-25 ENCOUNTER — Ambulatory Visit
Admission: EM | Admit: 2020-12-25 | Discharge: 2020-12-25 | Disposition: A | Discharge status: Home / Self Care | Payer: PRIVATE HEALTH INSURANCE | Attending: Physician Assistant | Admitting: Physician Assistant

## 2020-12-25 ENCOUNTER — Encounter: Payer: Self-pay | Admitting: Emergency Medicine

## 2020-12-25 ENCOUNTER — Other Ambulatory Visit: Payer: Self-pay

## 2020-12-25 DIAGNOSIS — R519 Headache, unspecified: Secondary | ICD-10-CM | POA: Insufficient documentation

## 2020-12-25 DIAGNOSIS — Z20822 Contact with and (suspected) exposure to covid-19: Secondary | ICD-10-CM | POA: Diagnosis not present

## 2020-12-25 DIAGNOSIS — Z79899 Other long term (current) drug therapy: Secondary | ICD-10-CM | POA: Insufficient documentation

## 2020-12-25 DIAGNOSIS — I10 Essential (primary) hypertension: Secondary | ICD-10-CM | POA: Insufficient documentation

## 2020-12-25 MED ORDER — LOSARTAN POTASSIUM 25 MG PO TABS: 25.0000 mg | ORAL_TABLET | Freq: Every day | ORAL | 1 refills | Status: DC

## 2020-12-25 NOTE — Discharge Instructions (Signed)
As discussed, there are many different causes of headaches including tension type headaches or migraines or certain infections such as COVID-19.  Additionally, your blood pressure is elevated so that could be a symptom of your elevated blood pressure.  This is something that has been monitored for a while and seems to continue to be a little bit elevated so we will treat at this time and have you follow-up with PCP.  Keep a log of your blood pressure and follow-up with PCP with the recordings.  You may need to stay on the medication if it is helping bring blood pressure down and with the headaches and what may possibly need to be increased.  If headaches continue you may need further work-up to characterize them and treat.  At this time take Tylenol and/or ibuprofen as needed for the headaches and rest.  Keep monitoring your blood sugar and making sure it is under good control.  You have received COVID testing today either for positive exposure, concerning symptoms that could be related to COVID infection, screening purposes, or re-testing after confirmed positive.  Your test obtained today checks for active viral infection in the last 1-2 weeks. If your test is negative now, you can still test positive later. So, if you do develop symptoms you should either get re-tested and/or isolate x 5 days and then strict mask use x 5 days (unvaccinated) or mask use x 10 days (vaccinated). Please follow CDC guidelines.  While Rapid antigen tests come back in 15-20 minutes, send out PCR/molecular test results typically come back within 1-3 days. In the mean time, if you are symptomatic, assume this could be a positive test and treat/monitor yourself as if you do have COVID.   We will call with test results if positive. Please download the MyChart app and set up a profile to access test results.   If symptomatic, go home and rest. Push fluids. Take Tylenol as needed for discomfort. Gargle warm salt water. Throat  lozenges. Take Mucinex DM or Robitussin for cough. Humidifier in bedroom to ease coughing. Warm showers. Also review the COVID handout for more information.  COVID-19 INFECTION: The incubation period of COVID-19 is approximately 14 days after exposure, with most symptoms developing in roughly 4-5 days. Symptoms may range in severity from mild to critically severe. Roughly 80% of those infected will have mild symptoms. People of any age may become infected with COVID-19 and have the ability to transmit the virus. The most common symptoms include: fever, fatigue, cough, body aches, headaches, sore throat, nasal congestion, shortness of breath, nausea, vomiting, diarrhea, changes in smell and/or taste.    COURSE OF ILLNESS Some patients may begin with mild disease which can progress quickly into critical symptoms. If your symptoms are worsening please call ahead to the Emergency Department and proceed there for further treatment. Recovery time appears to be roughly 1-2 weeks for mild symptoms and 3-6 weeks for severe disease.   GO IMMEDIATELY TO ER FOR FEVER YOU ARE UNABLE TO GET DOWN WITH TYLENOL, BREATHING PROBLEMS, CHEST PAIN, FATIGUE, LETHARGY, INABILITY TO EAT OR DRINK, ETC  QUARANTINE AND ISOLATION: To help decrease the spread of COVID-19 please remain isolated if you have COVID infection or are highly suspected to have COVID infection. This means -stay home and isolate to one room in the home if you live with others. Do not share a bed or bathroom with others while ill, sanitize and wipe down all countertops and keep common areas clean and disinfected.  Stay home for 5 days. If you have no symptoms or your symptoms are resolving after 5 days, you can leave your house. Continue to wear a mask around others for 5 additional days. If you have been in close contact (within 6 feet) of someone diagnosed with COVID 19, you are advised to quarantine in your home for 14 days as symptoms can develop anywhere from  2-14 days after exposure to the virus. If you develop symptoms, you  must isolate.  Most current guidelines for COVID after exposure -unvaccinated: isolate 5 days and strict mask use x 5 days. Test on day 5 is possible -vaccinated: wear mask x 10 days if symptoms do not develop -You do not necessarily need to be tested for COVID if you have + exposure and  develop symptoms. Just isolate at home x10 days from symptom onset During this global pandemic, CDC advises to practice social distancing, try to stay at least 90ft away from others at all times. Wear a face covering. Wash and sanitize your hands regularly and avoid going anywhere that is not necessary.  KEEP IN MIND THAT THE COVID TEST IS NOT 100% ACCURATE AND YOU SHOULD STILL DO EVERYTHING TO PREVENT POTENTIAL SPREAD OF VIRUS TO OTHERS (WEAR MASK, WEAR GLOVES, WASH HANDS AND SANITIZE REGULARLY). IF INITIAL TEST IS NEGATIVE, THIS MAY NOT MEAN YOU ARE DEFINITELY NEGATIVE. MOST ACCURATE TESTING IS DONE 5-7 DAYS AFTER EXPOSURE.   It is not advised by CDC to get re-tested after receiving a positive COVID test since you can still test positive for weeks to months after you have already cleared the virus.   *If you have not been vaccinated for COVID, I strongly suggest you consider getting vaccinated as long as there are no contraindications.

## 2020-12-25 NOTE — ED Triage Notes (Addendum)
Pt c/o headache, nausea and dizziness. Started about 9 days ago. She states the headaches started about 4pm.She states she thought it was from stopping caffeine but she has the headache even when she had caffeine. She states she checked her BP at her grandparents and her BP was 141/102. She has had elevated BP for the last year but her pcp said they would just watch it. Denies chest pain or shortness of breath.

## 2020-12-25 NOTE — ED Provider Notes (Addendum)
[facility]    CSN: [id number] Arrival date & time: 12/25/20  1334      History   Chief Complaint Chief Complaint  Patient presents with   Headache   Hypertension    HPI Amy Olson is a 25 y.o. female presenting for approximately 9-day history of intermittent headaches with associated nausea and feeling dizzy.  Patient states that the headaches are mild to moderate generally.  She is experiencing a mild headache at this time.  Tylenol relieves the headache.  She denies any history of headaches or migraines.  She does have history of diabetes and says that her blood sugars are under good control and she has been checking them recently.  Most elevated her blood sugars been has been up to 200 after eating.  She additionally is asthmatic but denies any breathing difficulty.  No associated chest pain or tightness.  No palpitations.  Denies any syncope and has not had any vomiting.  Patient states that she has had elevated blood pressures that her PCP has been monitoring over the past year.  She says that they have contemplated starting her medication but decided to just monitor.  Patient has had elevated blood pressure readings at home in the 140s over 90s.  Blood pressure in the clinic is 148/104.  She additionally denies any COVID-19 exposure and is fully vaccinated for COVID-19.  She denies any history of cough, congestion or sore throat.  No other concerns.  HPI  Past Medical History:  Diagnosis Date   allergies    Allergy    Asthma    Depression    Diabetes mellitus without complication ([place])    prediabetic    Patient Active Problem List   Diagnosis Date Noted   Breakthrough bleeding on OCPs 05/03/2020   Elevated blood pressure reading 05/03/2020   Open wound of finger 01/26/2020   Diabetes mellitus without complication ([place]) 87/68/1157   Depression 12/04/2015   Asthma 09/15/2013    Past Surgical History:  Procedure Laterality Date    ADENOIDECTOMY     1998- only adenoids    OB History   No obstetric history on file.      Home Medications    Prior to Admission medications   Medication Sig Start Date End Date Taking? Authorizing Provider  albuterol (PROAIR HFA) 108 (90 Base) MCG/ACT inhaler Inhale 2 puffs into the lungs every 6 (six) hours as needed for wheezing or shortness of breath. 01/26/20  Yes Burnard Hawthorne, FNP  Blood Glucose Monitoring Suppl (ONETOUCH VERIO REFLECT) w/Device KIT 1 Device by Does not apply route daily. 02/10/20  Yes Arnett, Yvetta Coder, FNP  EPINEPHrine 0.3 mg/0.3 mL IJ SOAJ injection epinephrine 0.3 mg/0.3 mL injection, auto-injector  INJECT INTRAMUSCULARLY AS DIRECTED   Yes [provider]  escitalopram (LEXAPRO) 10 MG tablet TAKE 1 TABLET(10 MG) BY MOUTH DAILY 11/22/20  Yes Arnett, Yvetta Coder, FNP  levocetirizine (XYZAL) 5 MG tablet Take 5 mg by mouth every evening.   Yes [provider]  Levonorgestrel-Ethinyl Estradiol (AMETHIA) 0.15-0.03 &0.01 MG tablet Take 1 tablet by mouth at bedtime. 10/26/20  Yes Harlin Heys, MD  losartan (COZAAR) 25 MG tablet Take 1 tablet (25 mg total) by mouth daily. 12/25/20 01/24/21 Yes Laurene Footman B, PA-C  metFORMIN (GLUCOPHAGE-XR) 500 MG 24 hr tablet TAKE 4 TABLETS(2000 MG) BY MOUTH EVERY EVENING 10/20/20  Yes Burnard Hawthorne, FNP  montelukast (SINGULAIR) 10 MG tablet TAKE 1 TABLET(10 MG) BY MOUTH AT BEDTIME 04/14/20  Yes Burnard Hawthorne, FNP  QVAR 80 MCG/ACT inhaler TAKE 2 PUFFS FIRST THING IN THE MORNING AND THEN  ANOTHER 2 PUFFS ABOUT 12  HOURS LATER. 11/13/16  Yes Tanda Rockers, MD  TRULICITY 3.30 QT/6.2UQ SOPN ADMINISTER 0.75 MG UNDER THE SKIN 1 TIME A WEEK 07/19/20  Yes Arnett, Yvetta Coder, FNP  glucose blood (ONETOUCH VERIO) test strip Used to check blood sugars twice daily. 02/10/20   Burnard Hawthorne, FNP  ISIBLOOM 0.15-30 MG-MCG tablet TAKE 1 TABLET BY MOUTH DAILY 10/20/20   Burnard Hawthorne, FNP  Lancets [facility]  ULTRASOFT) lancets Used to check blood sugars twice daily. 02/10/20   Burnard Hawthorne, FNP    Family History Family History  Problem Relation Age of Onset   Rheum arthritis Maternal Grandmother    Hyperlipidemia Maternal Grandmother    Hypertension Maternal Grandmother    Arthritis Maternal Grandmother    Memory loss Maternal Grandmother    Hyperlipidemia Mother    Diabetes Brother    Hyperlipidemia Maternal Grandfather    Hypertension Maternal Grandfather    Colon cancer Paternal Grandmother 12   Dementia Paternal Great-grandmother    Breast cancer Neg Hx    Thyroid cancer Neg Hx     Social History Social History   Tobacco Use   Smoking status: Never Smoker   Smokeless tobacco: Never Used  Scientific laboratory technician Use: Never used  Substance Use Topics   Alcohol use: Yes    Alcohol/week: 1.0 - 2.0 standard drink    Types: 1 - 2 Standard drinks or equivalent per week    Comment: rare   Drug use: No     Allergies   Augmentin [amoxicillin-pot clavulanate]   Review of Systems Review of Systems  Constitutional: Negative for chills, diaphoresis, fatigue and fever.  HENT: Negative for congestion, ear pain, rhinorrhea, sinus pressure, sinus pain and sore throat.   Eyes: Negative for photophobia and visual disturbance.  Respiratory: Negative for cough and shortness of breath.   Gastrointestinal: Positive for nausea. Negative for abdominal pain and vomiting.  Musculoskeletal: Negative for arthralgias and myalgias.  Skin: Negative for rash.  Neurological: Positive for dizziness and headaches. Negative for syncope and weakness.  Hematological: Negative for adenopathy.     Physical Exam Triage Vital Signs ED Triage Vitals  Enc Vitals Group     BP 12/25/20 1343 (!) 148/104     Pulse Rate 12/25/20 1343 90     Resp 12/25/20 1343 18     Temp 12/25/20 1343 98.2 F (36.8 C)     Temp Source 12/25/20 1343 Oral     SpO2 12/25/20 1343 99 %     Weight  12/25/20 1340 250 lb (113.4 kg)     Height 12/25/20 1340 _0  (1.676 m)     Head Circumference --      Peak Flow --      Pain Score 12/25/20 1340 5     Pain Loc --      Pain Edu? --      Excl. in [place]? --    No data found.  Updated Vital Signs BP (!) 148/104 (BP Location: Left Arm)    Pulse 90    Temp 98.2 F (36.8 C) (Oral)    Resp 18    Ht _1  (1.676 m)    Wt 250 lb (113.4 kg)    SpO2 99%    BMI 40.35 kg/m       Physical Exam Vitals  and nursing note reviewed.  Constitutional:      General: She is not in acute distress.    Appearance: Normal appearance. She is well-developed. She is not ill-appearing or toxic-appearing.  HENT:     Head: Normocephalic and atraumatic.     Right Ear: Tympanic membrane, ear canal and external ear normal.     Left Ear: Tympanic membrane, ear canal and external ear normal.     Nose: Nose normal.     Mouth/Throat:     Mouth: Mucous membranes are moist.     Pharynx: Oropharynx is clear.  Eyes:     General: No scleral icterus.       Right eye: No discharge.        Left eye: No discharge.     Conjunctiva/sclera: Conjunctivae normal.     Pupils: Pupils are equal, round, and reactive to light.  Cardiovascular:     Rate and Rhythm: Normal rate and regular rhythm.     Heart sounds: Normal heart sounds.  Pulmonary:     Effort: Pulmonary effort is normal. No respiratory distress.     Breath sounds: Normal breath sounds.  Musculoskeletal:     Cervical back: Neck supple.  Skin:    General: Skin is dry.  Neurological:     General: No focal deficit present.     Mental Status: She is alert and oriented to person, place, and time. Mental status is at baseline.     Cranial Nerves: No cranial nerve deficit.     Motor: No weakness.     Gait: Gait normal.  Psychiatric:        Mood and Affect: Mood normal.        Behavior: Behavior normal.        Thought Content: Thought content normal.      UC Treatments / Results  Labs (all labs ordered are  listed, but only abnormal results are displayed) Labs Reviewed  SARS CORONAVIRUS 2 (TAT 6-24 HRS)    EKG   Radiology No results found.  Procedures Procedures (including critical care time)  Medications Ordered in UC Medications - No data to display  Initial Impression / Assessment and Plan / UC Course  I have reviewed the triage vital signs and the nursing notes.  Pertinent labs & imaging results that were available during my care of the patient were reviewed by me and considered in my medical decision making (see chart for details).   25 year old female with 9-day history of intermittent headaches with associated nausea and dizziness as well as elevated blood pressures.  Blood pressure in the clinic is 148/104.  Her exam is benign today. No red flag signs or symptoms.   COVID-19 test obtained.  Current CDC guidelines, isolation protocol and ED precautions reviewed patient.  Patient does admit to a lot of stressors in her life and I advised her that some of her headaches could be related to her stress and tension type headaches.  Additionally, her blood pressure is elevated at 148/104 and has consistently been a bit elevated.  She is open to trying medication at this time and it was contemplated about 3 months ago by PCP to start her on losartan.  Patient started on losartan 25 mg tablets at this time and given a blood pressure log.  Advised her to keep a record and follow-up with PCP as she may want to continue this medication or change the dose or type.  Advised that if headaches are continuing then she  will still need to be seen by PCP for work-up of the headaches.  I did review ED precautions for headaches with patient.  Advised supportive care at this time with Tylenol and ibuprofen.  Final Clinical Impressions(s) / UC Diagnoses   Final diagnoses:  Acute nonintractable headache, unspecified headache type  Essential hypertension     Discharge Instructions     As discussed,  there are many different causes of headaches including tension type headaches or migraines or certain infections such as COVID-19.  Additionally, your blood pressure is elevated so that could be a symptom of your elevated blood pressure.  This is something that has been monitored for a while and seems to continue to be a little bit elevated so we will treat at this time and have you follow-up with PCP.  Keep a log of your blood pressure and follow-up with PCP with the recordings.  You may need to stay on the medication if it is helping bring blood pressure down and with the headaches and what may possibly need to be increased.  If headaches continue you may need further work-up to characterize them and treat.  At this time take Tylenol and/or ibuprofen as needed for the headaches and rest.  Keep monitoring your blood sugar and making sure it is under good control.  You have received COVID testing today either for positive exposure, concerning symptoms that could be related to COVID infection, screening purposes, or re-testing after confirmed positive.  Your test obtained today checks for active viral infection in the last 1-2 weeks. If your test is negative now, you can still test positive later. So, if you do develop symptoms you should either get re-tested and/or isolate x 5 days and then strict mask use x 5 days (unvaccinated) or mask use x 10 days (vaccinated). Please follow CDC guidelines.  While Rapid antigen tests come back in 15-20 minutes, send out PCR/molecular test results typically come back within 1-3 days. In the mean time, if you are symptomatic, assume this could be a positive test and treat/monitor yourself as if you do have COVID.   We will call with test results if positive. Please download the MyChart app and set up a profile to access test results.   If symptomatic, go home and rest. Push fluids. Take Tylenol as needed for discomfort. Gargle warm salt water. Throat lozenges. Take  Mucinex DM or Robitussin for cough. Humidifier in bedroom to ease coughing. Warm showers. Also review the COVID handout for more information.  COVID-19 INFECTION: The incubation period of COVID-19 is approximately 14 days after exposure, with most symptoms developing in roughly 4-5 days. Symptoms may range in severity from mild to critically severe. Roughly 80% of those infected will have mild symptoms. People of any age may become infected with COVID-19 and have the ability to transmit the virus. The most common symptoms include: fever, fatigue, cough, body aches, headaches, sore throat, nasal congestion, shortness of breath, nausea, vomiting, diarrhea, changes in smell and/or taste.    COURSE OF ILLNESS Some patients may begin with mild disease which can progress quickly into critical symptoms. If your symptoms are worsening please call ahead to the Emergency Department and proceed there for further treatment. Recovery time appears to be roughly 1-2 weeks for mild symptoms and 3-6 weeks for severe disease.   GO IMMEDIATELY TO ER FOR FEVER YOU ARE UNABLE TO GET DOWN WITH TYLENOL, BREATHING PROBLEMS, CHEST PAIN, FATIGUE, LETHARGY, INABILITY TO EAT OR DRINK, ETC  QUARANTINE  AND ISOLATION: To help decrease the spread of COVID-19 please remain isolated if you have COVID infection or are highly suspected to have COVID infection. This means -stay home and isolate to one room in the home if you live with others. Do not share a bed or bathroom with others while ill, sanitize and wipe down all countertops and keep common areas clean and disinfected. Stay home for 5 days. If you have no symptoms or your symptoms are resolving after 5 days, you can leave your house. Continue to wear a mask around others for 5 additional days. If you have been in close contact (within 6 feet) of someone diagnosed with COVID 19, you are advised to quarantine in your home for 14 days as symptoms can develop anywhere from 2-14 days  after exposure to the virus. If you develop symptoms, you  must isolate.  Most current guidelines for COVID after exposure -unvaccinated: isolate 5 days and strict mask use x 5 days. Test on day 5 is possible -vaccinated: wear mask x 10 days if symptoms do not develop -You do not necessarily need to be tested for COVID if you have + exposure and  develop symptoms. Just isolate at home x10 days from symptom onset During this global pandemic, CDC advises to practice social distancing, try to stay at least 67f away from others at all times. Wear a face covering. Wash and sanitize your hands regularly and avoid going anywhere that is not necessary.  KEEP IN MIND THAT THE COVID TEST IS NOT 100% ACCURATE AND YOU SHOULD STILL DO EVERYTHING TO PREVENT POTENTIAL SPREAD OF VIRUS TO OTHERS (WEAR MASK, WEAR GLOVES, WPerhamHANDS AND SANITIZE REGULARLY). IF INITIAL TEST IS NEGATIVE, THIS MAY NOT MEAN YOU ARE DEFINITELY NEGATIVE. MOST ACCURATE TESTING IS DONE 5-7 DAYS AFTER EXPOSURE.   It is not advised by CDC to get re-tested after receiving a positive COVID test since you can still test positive for weeks to months after you have already cleared the virus.   *If you have not been vaccinated for COVID, I strongly suggest you consider getting vaccinated as long as there are no contraindications.     ED Prescriptions    Medication Sig Dispense Auth. Provider   losartan (COZAAR) 25 MG tablet Take 1 tablet (25 mg total) by mouth daily. 30 tablet EGretta Cool    PDMP not reviewed this encounter.   EDanton Clap PA-C 12/25/20 1418    ELaurene FootmanB, PA-C 12/25/20 1418

## 2020-12-26 LAB — SARS CORONAVIRUS 2 (TAT 6-24 HRS): SARS Coronavirus 2: NEGATIVE

## 2020-12-27 ENCOUNTER — Encounter: Payer: Self-pay | Admitting: Family

## 2020-12-27 NOTE — Telephone Encounter (Signed)
Spoken to patient, she stated she is having elevated BP with headaches/waves of dizziness that come and go. She was seen at [facility] on Saturday. She was started on BP medication, patient stated she has been on the medication for two days and feels the same. Appointment has been placed to see provider tomorrow at 1430. Patient denied, SOB, chest px, arm px, jaw px, light headedness, blurry vision, and heart palpitations.

## 2020-12-28 ENCOUNTER — Encounter: Payer: Self-pay | Admitting: Internal Medicine

## 2020-12-28 ENCOUNTER — Other Ambulatory Visit: Payer: Self-pay

## 2020-12-28 ENCOUNTER — Ambulatory Visit: Payer: PRIVATE HEALTH INSURANCE | Admitting: Internal Medicine

## 2020-12-28 VITALS — BP 130/98 | HR 90 | Temp 97.9°F | Ht 66.0 in | Wt 243.1 lb

## 2020-12-28 DIAGNOSIS — Z1329 Encounter for screening for other suspected endocrine disorder: Secondary | ICD-10-CM

## 2020-12-28 DIAGNOSIS — E559 Vitamin D deficiency, unspecified: Secondary | ICD-10-CM

## 2020-12-28 DIAGNOSIS — I10 Essential (primary) hypertension: Secondary | ICD-10-CM | POA: Diagnosis not present

## 2020-12-28 DIAGNOSIS — E1159 Type 2 diabetes mellitus with other circulatory complications: Secondary | ICD-10-CM | POA: Diagnosis not present

## 2020-12-28 DIAGNOSIS — I152 Hypertension secondary to endocrine disorders: Secondary | ICD-10-CM

## 2020-12-28 DIAGNOSIS — E538 Deficiency of other specified B group vitamins: Secondary | ICD-10-CM

## 2020-12-28 MED ORDER — LOSARTAN POTASSIUM-HCTZ 50-12.5 MG PO TABS: 1.0000 | ORAL_TABLET | Freq: Every day | ORAL | 3 refills | Status: DC

## 2020-12-28 NOTE — Patient Instructions (Addendum)
Losartan; Hydrochlorothiazide, HCTZ Oral Tablets What is this medicine? LOSARTAN; HYDROCHLOROTHIAZIDE (loe SAR tan; hye droe klor oh THYE a zide) is a combination of an angiotensin II receptor blocker and a diuretic. It treats high blood pressure. It may also be used to lower the risk of stroke. This medicine may be used for other purposes; ask your health care provider or pharmacist if you have questions. COMMON BRAND NAME(S): Hyzaar What should I tell my health care provider before I take this medicine? They need to know if you have any of these conditions:  decreased urine  diabetes  kidney disease  liver disease  if you are on a special diet, like a low-salt diet  immune system problems, like lupus  an unusual or allergic reaction to losartan, hydrochlorothiazide, sulfa drugs, other medicines, foods, dyes, or preservatives  pregnant or trying to get pregnant  breast-feeding How should I use this medicine? Take this drug by mouth. Take it as directed on the prescription label at the same time every day. You can take it with or without food. If it upsets your stomach, take it with food. Keep taking it unless your health care provider tells you to stop. Talk to your health care provider about the use of this drug in children. Special care may be needed. Overdosage: If you think you have taken too much of this medicine contact a poison control center or emergency room at once. NOTE: This medicine is only for you. Do not share this medicine with others. What if I miss a dose? If you miss a dose, take it as soon as you can. If it is almost time for your next dose, take only that dose. Do not take double or extra doses. What may interact with this medicine? This medicine may also interact with the following medications:  barbiturates, like phenobarbital  blood pressure medicines  celecoxib  cimetidine  corticosteroids  diabetic medicines  diuretics, especially  triamterene, spironolactone or amiloride  fluconazole  lithium  NSAIDs, medicines for pain and inflammation, like ibuprofen or naproxen  potassium salts or potassium supplements  prescription pain medicines  rifampin  skeletal muscle relaxants like tubocurarine  some cholesterol-lowering medicines like cholestyramine or colestipol This list may not describe all possible interactions. Give your health care provider a list of all the medicines, herbs, non-prescription drugs, or dietary supplements you use. Also tell them if you smoke, drink alcohol, or use illegal drugs. Some items may interact with your medicine. What should I watch for while using this medicine? Check your blood pressure regularly while you are taking this medicine. Ask your doctor or health care professional what your blood pressure should be and when you should contact him or her. When you check your blood pressure, write down the measurements to show your doctor or health care professional. If you are taking this medicine for a long time, you must visit your health care professional for regular checks on your progress. Make sure you schedule appointments on a regular basis. You must not get dehydrated. Ask your doctor or health care professional how much fluid you need to drink a day. Check with him or her if you get an attack of severe diarrhea, nausea and vomiting, or if you sweat a lot. The loss of too much body fluid can make it dangerous for you to take this medicine. Women should inform their doctor if they wish to become pregnant or think they might be pregnant. There is a potential for serious side  effects to an unborn child, particularly in the second or third trimester. Talk to your health care professional or pharmacist for more information. You may get drowsy or dizzy. Do not drive, use machinery, or do anything that needs mental alertness until you know how this drug affects you. Do not stand or sit up quickly,  especially if you are an older patient. This reduces the risk of dizzy or fainting spells. Alcohol can make you more drowsy and dizzy. Avoid alcoholic drinks. This medicine may increase blood sugar. Ask your healthcare provider if changes in diet or medicines are needed if you have diabetes. Talk to your health care professional about your risk of skin cancer. You may be more at risk for skin cancer if you take this medicine. This medicine can make you more sensitive to the sun. Keep out of the sun. If you cannot avoid being in the sun, wear protective clothing and use sunscreen. Do not use sun lamps or tanning beds/booths. Avoid salt substitutes unless you are told otherwise by your doctor or health care professional. Do not treat yourself for coughs, colds, or pain while you are taking this medicine without asking your doctor or health care professional for advice. Some ingredients may increase your blood pressure. What side effects may I notice from receiving this medicine? Side effects that you should report to your doctor or health care professional as soon as possible:  allergic reactions like skin rash, itching or hives, swelling of the face, lips, or tongue  breathing problems  changes in vision  dark urine  eye pain  fast or irregular heart beat, palpitations, or chest pain  feeling faint or lightheaded  muscle cramps  persistent dry cough  redness, blistering, peeling or loosening of the skin, including inside the mouth  signs and symptoms of high blood sugar such as being more thirsty or hungry or having to urinate more than normal. You may also feel very tired or have blurry vision.  stomach pain  trouble passing urine  unusual bleeding or bruising  worsened gout pain  yellowing of the eyes or skin Side effects that usually do not require medical attention (report to your doctor or health care professional if they continue or are bothersome):  change in sex drive  or performance  headache This list may not describe all possible side effects. Call your doctor for medical advice about side effects. You may report side effects to FDA at 1-800-FDA-1088. Where should I keep my medicine? Keep out of the reach of children and pets. Store at room temperature between 15 and 30 degrees C (59 and 86 degrees F). Protect from light. Keep the container tightly closed. Throw away any unused drug after the expiration date. NOTE: This sheet is a summary. It may not cover all possible information. If you have questions about this medicine, talk to your doctor, pharmacist, or health care provider.  2021 Elsevier/Gold Standard (2019-09-02 17:07:13)  Hypertension, Adult High blood pressure (hypertension) is when the force of blood pumping through the arteries is too strong. The arteries are the blood vessels that carry blood from the heart throughout the body. Hypertension forces the heart to work harder to pump blood and may cause arteries to become narrow or stiff. Untreated or uncontrolled hypertension can cause a heart attack, heart failure, a stroke, kidney disease, and other problems. A blood pressure reading consists of a higher number over a lower number. Ideally, your blood pressure should be below 120/80. The first ("top")  number is called the systolic pressure. It is a measure of the pressure in your arteries as your heart beats. The second ("bottom") number is called the diastolic pressure. It is a measure of the pressure in your arteries as the heart relaxes. What are the causes? The exact cause of this condition is not known. There are some conditions that result in or are related to high blood pressure. What increases the risk? Some risk factors for high blood pressure are under your control. The following factors may make you more likely to develop this condition:  Smoking.  Having type 2 diabetes mellitus, high cholesterol, or both.  Not getting enough  exercise or physical activity.  Being overweight.  Having too much fat, sugar, calories, or salt (sodium) in your diet.  Drinking too much alcohol. Some risk factors for high blood pressure may be difficult or impossible to change. Some of these factors include:  Having chronic kidney disease.  Having a family history of high blood pressure.  Age. Risk increases with age.  Race. You may be at higher risk if you are African American.  Gender. Men are at higher risk than women before age 43. After age 52, women are at higher risk than men.  Having obstructive sleep apnea.  Stress. What are the signs or symptoms? High blood pressure may not cause symptoms. Very high blood pressure (hypertensive crisis) may cause:  Headache.  Anxiety.  Shortness of breath.  Nosebleed.  Nausea and vomiting.  Vision changes.  Severe chest pain.  Seizures. How is this diagnosed? This condition is diagnosed by measuring your blood pressure while you are seated, with your arm resting on a flat surface, your legs uncrossed, and your feet flat on the floor. The cuff of the blood pressure monitor will be placed directly against the skin of your upper arm at the level of your heart. It should be measured at least twice using the same arm. Certain conditions can cause a difference in blood pressure between your right and left arms. Certain factors can cause blood pressure readings to be lower or higher than normal for a short period of time:  When your blood pressure is higher when you are in a health care provider's office than when you are at home, this is called white coat hypertension. Most people with this condition do not need medicines.  When your blood pressure is higher at home than when you are in a health care provider's office, this is called masked hypertension. Most people with this condition may need medicines to control blood pressure. If you have a high blood pressure reading during  one visit or you have normal blood pressure with other risk factors, you may be asked to:  Return on a different day to have your blood pressure checked again.  Monitor your blood pressure at home for 1 week or longer. If you are diagnosed with hypertension, you may have other blood or imaging tests to help your health care provider understand your overall risk for other conditions. How is this treated? This condition is treated by making healthy lifestyle changes, such as eating healthy foods, exercising more, and reducing your alcohol intake. Your health care provider may prescribe medicine if lifestyle changes are not enough to get your blood pressure under control, and if:  Your systolic blood pressure is above 130.  Your diastolic blood pressure is above 80. Your personal target blood pressure may vary depending on your medical conditions, your age, and other  factors. Follow these instructions at home: Eating and drinking  Eat a diet that is high in fiber and potassium, and low in sodium, added sugar, and fat. An example eating plan is called the DASH (Dietary Approaches to Stop Hypertension) diet. To eat this way: ? Eat plenty of fresh fruits and vegetables. Try to fill one half of your plate at each meal with fruits and vegetables. ? Eat whole grains, such as whole-wheat pasta, brown rice, or whole-grain bread. Fill about one fourth of your plate with whole grains. ? Eat or drink low-fat dairy products, such as skim milk or low-fat yogurt. ? Avoid fatty cuts of meat, processed or cured meats, and poultry with skin. Fill about one fourth of your plate with lean proteins, such as fish, chicken without skin, beans, eggs, or tofu. ? Avoid pre-made and processed foods. These tend to be higher in sodium, added sugar, and fat.  Reduce your daily sodium intake. Most people with hypertension should eat less than 1,500 mg of sodium a day.  Do not drink alcohol if: ? Your health care provider  tells you not to drink. ? You are pregnant, may be pregnant, or are planning to become pregnant.  If you drink alcohol: ? Limit how much you use to:  0-1 drink a day for women.  0-2 drinks a day for men. ? Be aware of how much alcohol is in your drink. In the [place] one drink equals one 12 oz bottle of beer (355 mL), one 5 oz glass of wine (148 mL), or one 1 oz glass of hard liquor (44 mL).   Lifestyle  Work with your health care provider to maintain a healthy body weight or to lose weight. Ask what an ideal weight is for you.  Get at least 30 minutes of exercise most days of the week. Activities may include walking, swimming, or biking.  Include exercise to strengthen your muscles (resistance exercise), such as Pilates or lifting weights, as part of your weekly exercise routine. Try to do these types of exercises for 30 minutes at least 3 days a week.  Do not use any products that contain nicotine or tobacco, such as cigarettes, e-cigarettes, and chewing tobacco. If you need help quitting, ask your health care provider.  Monitor your blood pressure at home as told by your health care provider.  Keep all follow-up visits as told by your health care provider. This is important.   Medicines  Take over-the-counter and prescription medicines only as told by your health care provider. Follow directions carefully. Blood pressure medicines must be taken as prescribed.  Do not skip doses of blood pressure medicine. Doing this puts you at risk for problems and can make the medicine less effective.  Ask your health care provider about side effects or reactions to medicines that you should watch for. Contact a health care provider if you:  Think you are having a reaction to a medicine you are taking.  Have headaches that keep coming back (recurring).  Feel dizzy.  Have swelling in your ankles.  Have trouble with your vision. Get help right away if you:  Develop a severe headache or  confusion.  Have unusual weakness or numbness.  Feel faint.  Have severe pain in your chest or abdomen.  Vomit repeatedly.  Have trouble breathing. Summary  Hypertension is when the force of blood pumping through your arteries is too strong. If this condition is not controlled, it may put you at risk for  serious complications.  Your personal target blood pressure may vary depending on your medical conditions, your age, and other factors. For most people, a normal blood pressure is less than 120/80.  Hypertension is treated with lifestyle changes, medicines, or a combination of both. Lifestyle changes include losing weight, eating a healthy, low-sodium diet, exercising more, and limiting alcohol. This information is not intended to replace advice given to you by your health care provider. Make sure you discuss any questions you have with your health care provider. Document Revised: 06/19/2018 Document Reviewed: 06/19/2018 Elsevier Patient Education  2021 Elsevier Inc.  How to Take Your Blood Pressure Blood pressure is a measurement of how strongly your blood is pressing against the walls of your arteries. Arteries are blood vessels that carry blood from your heart throughout your body. Your health care provider takes your blood pressure at each office visit. You can also take your own blood pressure at home with a blood pressure monitor. You may need to take your own blood pressure to:  Confirm a diagnosis of high blood pressure (hypertension).  Monitor your blood pressure over time.  Make sure your blood pressure medicine is working. Supplies needed:  Blood pressure monitor.  Dining room chair to sit in.  Table or desk.  Small notebook and pencil or pen. How to prepare To get the most accurate reading, avoid the following for 30 minutes before you check your blood pressure:  Drinking caffeine.  Drinking alcohol.  Eating.  Smoking.  Exercising. Five minutes before  you check your blood pressure:  Use the bathroom and urinate so that you have an empty bladder.  Sit quietly in a dining room chair. Do not sit in a soft couch or an armchair. Do not talk. How to take your blood pressure To check your blood pressure, follow the instructions in the manual that came with your blood pressure monitor. If you have a digital blood pressure monitor, the instructions may be as follows: 1. Sit up straight in a chair. 2. Place your feet on the floor. Do not cross your ankles or legs. 3. Rest your left arm at the level of your heart on a table or desk or on the arm of a chair. 4. Pull up your shirt sleeve. 5. Wrap the blood pressure cuff around the upper part of your left arm, 1 inch (2.5 cm) above your elbow. It is best to wrap the cuff around bare skin. 6. Fit the cuff snugly around your arm. You should be able to place only one finger between the cuff and your arm. 7. Position the cord so that it rests in the bend of your elbow. 8. Press the power button. 9. Sit quietly while the cuff inflates and deflates. 10. Read the digital reading on the monitor screen and write the numbers down (record them) in a notebook. 11. Wait 2-3 minutes, then repeat the steps, starting at step 1.   What does my blood pressure reading mean? A blood pressure reading consists of a higher number over a lower number. Ideally, your blood pressure should be below 120/80. The first ("top") number is called the systolic pressure. It is a measure of the pressure in your arteries as your heart beats. The second ("bottom") number is called the diastolic pressure. It is a measure of the pressure in your arteries as the heart relaxes. Blood pressure is classified into five stages. The following are the stages for adults who do not have a short-term serious illness  or a chronic condition. Systolic pressure and diastolic pressure are measured in a unit called mm Hg (millimeters of mercury).   Normal  Systolic pressure: below 120.  Diastolic pressure: below 80. Elevated  Systolic pressure: 120-129.  Diastolic pressure: below 80. Hypertension stage 1  Systolic pressure: 130-139.  Diastolic pressure: 80-89. Hypertension stage 2  Systolic pressure: 140 or above.  Diastolic pressure: 90 or above. You can have elevated blood pressure or hypertension even if only the systolic or only the diastolic number in your reading is higher than normal. Follow these instructions at home:  Check your blood pressure as often as recommended by your health care provider.  Check your blood pressure at the same time every day.  Take your monitor to the next appointment with your health care provider to make sure that: ? You are using it correctly. ? It provides accurate readings.  Be sure you understand what your goal blood pressure numbers are.  Tell your health care provider if you are having any side effects from blood pressure medicine.  Keep all follow-up visits as told by your health care provider. This is important. General tips  Your health care provider can suggest a reliable monitor that will meet your needs. There are several types of home blood pressure monitors.  Choose a monitor that has an arm cuff. Do not choose a monitor that measures your blood pressure from your wrist or finger.  Choose a cuff that wraps snugly around your upper arm. You should be able to fit only one finger between your arm and the cuff.  You can buy a blood pressure monitor at most drugstores or online. Where to find more information American Heart Association: www.heart.org Contact a health care provider if:  Your blood pressure is consistently high. Get help right away if:  Your systolic blood pressure is higher than 180.  Your diastolic blood pressure is higher than 120. Summary  Blood pressure is a measurement of how strongly your blood is pressing against the walls of your  arteries.  A blood pressure reading consists of a higher number over a lower number. Ideally, your blood pressure should be below 120/80.  Check your blood pressure at the same time every day.  Avoid caffeine, alcohol, smoking, and exercise for 30 minutes prior to checking your blood pressure. These agents can affect the accuracy of the blood pressure reading. This information is not intended to replace advice given to you by your health care provider. Make sure you discuss any questions you have with your health care provider. Document Revised: 10/03/2019 Document Reviewed: 10/03/2019 Elsevier Patient Education  2021 Elsevier Inc.  Cooking With Less [employer] with less salt is one way to reduce the amount of sodium you get from food. Sodium is one of the elements that make up salt. It is found naturally in foods and is also added to certain foods. Depending on your condition and overall health, your health care provider or dietitian may recommend that you reduce your sodium intake. Most people should have less than 2,300 milligrams (mg) of sodium each day. If you have high blood pressure (hypertension), you may need to limit your sodium to 1,500 mg each day. Follow the tips below to help reduce your sodium intake. What are tips for eating less sodium? Reading food labels  Check the food label before buying or using packaged ingredients. Always check the label for the serving size and sodium content.  Look for products with no more  than 140 mg of sodium in one serving.  Check the % Daily Value column to see what percent of the daily recommended amount of sodium is provided in one serving of the product. Foods with 5% or less in this column are considered low in sodium. Foods with 20% or higher are considered high in sodium.  Do not choose foods with salt as one of the first three ingredients on the ingredients list. If salt is one of the first three ingredients, it usually means the item is  high in sodium.   Shopping  Buy sodium-free or low-sodium products. Look for the following words on food labels: ? Low-sodium. ? Sodium-free. ? Reduced-sodium. ? No salt added. ? Unsalted.  Always check the sodium content even if foods are labeled as low-sodium or no salt added.  Buy fresh foods. Cooking  Use herbs, seasonings without salt, and spices as substitutes for salt.  Use sodium-free baking soda when baking.  Grill, braise, or roast foods to add flavor with less salt.  Avoid adding salt to pasta, rice, or hot cereals.  Drain and rinse canned vegetables, beans, and meat before use.  Avoid adding salt when cooking sweets and desserts.  Cook with low-sodium ingredients. What foods are high in sodium? Vegetables Regular canned vegetables (not low-sodium or reduced-sodium). Sauerkraut, pickled vegetables, and relishes. Olives. [place] fries. Onion rings. Regular canned tomato sauce and paste. Regular tomato and vegetable juice. Frozen vegetables in sauces. Grains Instant hot cereals. Bread stuffing, pancake, and biscuit mixes. Croutons. Seasoned rice or pasta mixes. Noodle soup cups. Boxed or frozen macaroni and cheese. Regular salted crackers. Self-rising flour. Rolls. Bagels. Flour tortillas and wraps. Meats and other proteins Meat or fish that is salted, canned, smoked, cured, spiced, or pickled. This includes bacon, ham, sausages, hot dogs, corned beef, chipped beef, meat loaves, salt pork, jerky, pickled herring, anchovies, regular canned tuna, and sardines. Salted nuts. Dairy Processed cheese and cheese spreads. Cheese curds. Blue cheese. Feta cheese. String cheese. Regular cottage cheese. Buttermilk. Canned milk. The items listed above may not be a complete list of foods high in sodium. Actual amounts of sodium may be different depending on processing. Contact a dietitian for more information. What foods are low in sodium? Fruits Fresh, frozen, or canned fruit with  no sauce added. Fruit juice. Vegetables Fresh or frozen vegetables with no sauce added. "No salt added" canned vegetables. "No salt added" tomato sauce and paste. Low-sodium or reduced-sodium tomato and vegetable juice. Grains Noodles, pasta, quinoa, rice. Shredded or puffed wheat or puffed rice. Regular or quick oats (not instant). Low-sodium crackers. Low-sodium bread. Whole-grain bread and whole-grain pasta. Unsalted popcorn. Meats and other proteins Fresh or frozen whole meats, poultry (not injected with sodium), and fish with no sauce added. Unsalted nuts. Dried peas, beans, and lentils without added salt. Unsalted canned beans. Eggs. Unsalted nut butters. Low-sodium canned tuna or chicken. Dairy Milk. Soy milk. Yogurt. Low-sodium cheeses, such as Swiss, [address], [place], and [employer]. Sherbet or ice cream (keep to  cup per serving). Cream cheese. Fats and oils Unsalted butter or margarine. Other foods Homemade pudding. Sodium-free baking soda and baking powder. Herbs and spices. Low-sodium seasoning mixes. Beverages Coffee and tea. Carbonated beverages. The items listed above may not be a complete list of foods low in sodium. Actual amounts of sodium may be different depending on processing. Contact a dietitian for more information. What are some salt alternatives when cooking? The following are herbs, seasonings, and spices that can  be used instead of salt to flavor your food. Herbs should be fresh or dried. Do not choose packaged mixes. Next to the name of the herb, spice, or seasoning are some examples of foods you can pair it with. Herbs  Bay leaves - Soups, meat and vegetable dishes, and spaghetti sauce.  Basil - [employer], soups, pasta, and fish dishes.  Cilantro - Meat, poultry, and vegetable dishes.  Chili powder - Marinades and Mexican dishes.  Chives - Salad dressings and potato dishes.  Cumin - Mexican dishes, couscous, and meat dishes.  Dill - Fish  dishes, sauces, and salads.  Fennel - Meat and vegetable dishes, breads, and cookies.  Garlic (do not use garlic salt) - [place] dishes, meat dishes, salad dressings, and sauces.  Marjoram - Soups, potato dishes, and meat dishes.  Oregano - Pizza and spaghetti sauce.  Parsley - Salads, soups, pasta, and meat dishes.  Rosemary - [place] dishes, salad dressings, soups, and red meats.  Saffron - Fish dishes, pasta, and some poultry dishes.  Sage - Stuffings and sauces.  Tarragon - Fish and [employer].  Thyme - Stuffing, meat, and fish dishes. Seasonings  Lemon juice - Fish dishes, poultry dishes, vegetables, and salads.  Vinegar - Salad dressings, vegetables, and fish dishes. Spices  Cinnamon - Sweet dishes, such as cakes, cookies, and puddings.  Cloves - Gingerbread, puddings, and marinades for meats.  Curry - Vegetable dishes, fish and poultry dishes, and stir-fry dishes.  Ginger - Vegetable dishes, fish dishes, and stir-fry dishes.  Nutmeg - Pasta, vegetables, poultry, fish dishes, and custard. Summary  Cooking with less salt is one way to reduce the amount of sodium that you get from food.  Buy sodium-free or low-sodium products.  Check the food label before using or buying packaged ingredients.  Use herbs, seasonings without salt, and spices as substitutes for salt in foods. This information is not intended to replace advice given to you by your health care provider. Make sure you discuss any questions you have with your health care provider. Document Revised: 10/01/2019 Document Reviewed: 10/01/2019 Elsevier Patient Education  2021 Elsevier Inc.  DASH Eating Plan DASH stands for Dietary Approaches to Stop Hypertension. The DASH eating plan is a healthy eating plan that has been shown to:  Reduce high blood pressure (hypertension).  Reduce your risk for type 2 diabetes, heart disease, and stroke.  Help with weight loss. What are tips for following this  plan? Reading food labels  Check food labels for the amount of salt (sodium) per serving. Choose foods with less than 5 percent of the Daily Value of sodium. Generally, foods with less than 300 milligrams (mg) of sodium per serving fit into this eating plan.  To find whole grains, look for the word "whole" as the first word in the ingredient list. Shopping  Buy products labeled as "low-sodium" or "no salt added."  Buy fresh foods. Avoid canned foods and pre-made or frozen meals. Cooking  Avoid adding salt when cooking. Use salt-free seasonings or herbs instead of table salt or sea salt. Check with your health care provider or pharmacist before using salt substitutes.  Do not fry foods. Cook foods using healthy methods such as baking, boiling, grilling, roasting, and broiling instead.  Cook with heart-healthy oils, such as olive, canola, avocado, soybean, or sunflower oil. Meal planning  Eat a balanced diet that includes: ? 4 or more servings of fruits and 4 or more servings of vegetables each day. Try to fill  one-half of your plate with fruits and vegetables. ? 6-8 servings of whole grains each day. ? Less than 6 oz (170 g) of lean meat, poultry, or fish each day. A 3-oz (85-g) serving of meat is about the same size as a deck of cards. One egg equals 1 oz (28 g). ? 2-3 servings of low-fat dairy each day. One serving is 1 cup (237 mL). ? 1 serving of nuts, seeds, or beans 5 times each week. ? 2-3 servings of heart-healthy fats. Healthy fats called omega-3 fatty acids are found in foods such as walnuts, flaxseeds, fortified milks, and eggs. These fats are also found in cold-water fish, such as sardines, salmon, and mackerel.  Limit how much you eat of: ? Canned or prepackaged foods. ? Food that is high in trans fat, such as some fried foods. ? Food that is high in saturated fat, such as fatty meat. ? Desserts and other sweets, sugary drinks, and other foods with added sugar. ? Full-fat  dairy products.  Do not salt foods before eating.  Do not eat more than 4 egg yolks a week.  Try to eat at least 2 vegetarian meals a week.  Eat more home-cooked food and less restaurant, buffet, and fast food.   Lifestyle  When eating at a restaurant, ask that your food be prepared with less salt or no salt, if possible.  If you drink alcohol: ? Limit how much you use to:  0-1 drink a day for women who are not pregnant.  0-2 drinks a day for men. ? Be aware of how much alcohol is in your drink. In the [place] one drink equals one 12 oz bottle of beer (355 mL), one 5 oz glass of wine (148 mL), or one 1 oz glass of hard liquor (44 mL). General information  Avoid eating more than 2,300 mg of salt a day. If you have hypertension, you may need to reduce your sodium intake to 1,500 mg a day.  Work with your health care provider to maintain a healthy body weight or to lose weight. Ask what an ideal weight is for you.  Get at least 30 minutes of exercise that causes your heart to beat faster (aerobic exercise) most days of the week. Activities may include walking, swimming, or biking.  Work with your health care provider or dietitian to adjust your eating plan to your individual calorie needs. What foods should I eat? Fruits All fresh, dried, or frozen fruit. Canned fruit in natural juice (without added sugar). Vegetables Fresh or frozen vegetables (raw, steamed, roasted, or grilled). Low-sodium or reduced-sodium tomato and vegetable juice. Low-sodium or reduced-sodium tomato sauce and tomato paste. Low-sodium or reduced-sodium canned vegetables. Grains Whole-grain or whole-wheat bread. Whole-grain or whole-wheat pasta. Brown rice. Orpah Cobb. Bulgur. Whole-grain and low-sodium cereals. Pita bread. Low-fat, low-sodium crackers. Whole-wheat flour tortillas. Meats and other proteins Skinless chicken or [place]. Ground chicken or [place]. Pork with fat trimmed off. Fish and seafood. Egg  whites. Dried beans, peas, or lentils. Unsalted nuts, nut butters, and seeds. Unsalted canned beans. Lean cuts of beef with fat trimmed off. Low-sodium, lean precooked or cured meat, such as sausages or meat loaves. Dairy Low-fat (1%) or fat-free (skim) milk. Reduced-fat, low-fat, or fat-free cheeses. Nonfat, low-sodium ricotta or cottage cheese. Low-fat or nonfat yogurt. Low-fat, low-sodium cheese. Fats and oils Soft margarine without trans fats. Vegetable oil. Reduced-fat, low-fat, or light mayonnaise and salad dressings (reduced-sodium). Canola, safflower, olive, avocado, soybean, and sunflower oils. Avocado.  Seasonings and condiments Herbs. Spices. Seasoning mixes without salt. Other foods Unsalted popcorn and pretzels. Fat-free sweets. The items listed above may not be a complete list of foods and beverages you can eat. Contact a dietitian for more information. What foods should I avoid? Fruits Canned fruit in a light or heavy syrup. Fried fruit. Fruit in cream or butter sauce. Vegetables Creamed or fried vegetables. Vegetables in a cheese sauce. Regular canned vegetables (not low-sodium or reduced-sodium). Regular canned tomato sauce and paste (not low-sodium or reduced-sodium). Regular tomato and vegetable juice (not low-sodium or reduced-sodium). Rosita Fire. Olives. Grains Baked goods made with fat, such as croissants, muffins, or some breads. Dry pasta or rice meal packs. Meats and other proteins Fatty cuts of meat. Ribs. Fried meat. Tomasa Blase. Bologna, salami, and other precooked or cured meats, such as sausages or meat loaves. Fat from the back of a pig (fatback). Bratwurst. Salted nuts and seeds. Canned beans with added salt. Canned or smoked fish. Whole eggs or egg yolks. Chicken or [place] with skin. Dairy Whole or 2% milk, cream, and half-and-half. Whole or full-fat cream cheese. Whole-fat or sweetened yogurt. Full-fat cheese. Nondairy creamers. Whipped toppings. Processed cheese and  cheese spreads. Fats and oils Butter. Stick margarine. Lard. Shortening. Ghee. Bacon fat. Tropical oils, such as coconut, palm kernel, or palm oil. Seasonings and condiments Onion salt, garlic salt, seasoned salt, table salt, and sea salt. Worcestershire sauce. Tartar sauce. Barbecue sauce. Teriyaki sauce. Soy sauce, including reduced-sodium. Steak sauce. Canned and packaged gravies. Fish sauce. Oyster sauce. Cocktail sauce. Store-bought horseradish. Ketchup. Mustard. Meat flavorings and tenderizers. Bouillon cubes. Hot sauces. Pre-made or packaged marinades. Pre-made or packaged taco seasonings. Relishes. Regular salad dressings. Other foods Salted popcorn and pretzels. The items listed above may not be a complete list of foods and beverages you should avoid. Contact a dietitian for more information. Where to find more information  National Heart, Lung, and Blood Institute: PopSteam.is  American Heart Association: www.heart.org  Academy of Nutrition and Dietetics: www.eatright.org  National Kidney Foundation: www.kidney.org Summary  The DASH eating plan is a healthy eating plan that has been shown to reduce high blood pressure (hypertension). It may also reduce your risk for type 2 diabetes, heart disease, and stroke.  When on the DASH eating plan, aim to eat more fresh fruits and vegetables, whole grains, lean proteins, low-fat dairy, and heart-healthy fats.  With the DASH eating plan, you should limit salt (sodium) intake to 2,300 mg a day. If you have hypertension, you may need to reduce your sodium intake to 1,500 mg a day.  Work with your health care provider or dietitian to adjust your eating plan to your individual calorie needs. This information is not intended to replace advice given to you by your health care provider. Make sure you discuss any questions you have with your health care provider. Document Revised: 09/12/2019 Document Reviewed: 09/12/2019 Elsevier Patient  Education  2021 [employer].

## 2020-12-28 NOTE — Progress Notes (Signed)
Chief Complaint  Patient presents with  . Blood Pressure Check  . Hypertension    Dizziness, nauesa, headaches. Checked BP on 12/01/90 and diastolic was 426 so pt went to UC, was given Losartan to help control BP.    F/u  1. HTN went to [facility]/25/20 due to dizziness/nausea/ha, weakness, head felt heavy and like was falling foward Saturday BP was high 165/105 148/104. She had losartan 25 mg qd from PCP but had not started to take this yet and once started BP to trend down but still not at goal <130/<80  Review of Systems  Constitutional: Negative for weight loss.  HENT: Negative for hearing loss.   Eyes: Negative for blurred vision.  Respiratory: Negative for shortness of breath.   Cardiovascular: Negative for chest pain.  Gastrointestinal: Negative for abdominal pain.  Musculoskeletal: Negative for falls.  Skin: Negative for rash.  Neurological: Negative for headaches.  Psychiatric/Behavioral: Negative for depression.   Past Medical History:  Diagnosis Date  . allergies   . Allergy   . Asthma   . Depression   . Diabetes mellitus without complication ([place])    prediabetic   Past Surgical History:  Procedure Laterality Date  . ADENOIDECTOMY     1998- only adenoids   Family History  Problem Relation Age of Onset  . Rheum arthritis Maternal Grandmother   . Hyperlipidemia Maternal Grandmother   . Hypertension Maternal Grandmother   . Arthritis Maternal Grandmother   . Memory loss Maternal Grandmother   . Hyperlipidemia Mother   . Hypertension Father   . Diabetes Brother   . Hyperlipidemia Maternal Grandfather   . Hypertension Maternal Grandfather   . Cystic kidney disease Maternal Grandfather        polycystic kidney disorder   . Colon cancer Paternal Grandmother 39  . Hypertension Paternal Grandfather   . Dementia Paternal Great-grandmother   . Cystic kidney disease Maternal Aunt   . Breast cancer Neg Hx   . Thyroid cancer Neg Hx    Social History   Socioeconomic  History  . Marital status: Single    Spouse name: Not on file  . Number of children: Not on file  . Years of education: Not on file  . Highest education level: Not on file  Occupational History  . Occupation: school  Tobacco Use  . Smoking status: Never Smoker  . Smokeless tobacco: Never Used  Vaping Use  . Vaping Use: Never used  Substance and Sexual Activity  . Alcohol use: Yes    Alcohol/week: 1.0 - 2.0 standard drink    Types: 1 - 2 Standard drinks or equivalent per week    Comment: rare  . Drug use: No  . Sexual activity: Not Currently  Other Topics Concern  . Not on file  Social History Narrative   Single   Works at [employer]- plans to go back to [employer].   Caffeine- no soda, 1 cup tea, rare coffee   Social Determinants of Health   Financial Resource Strain: Not on file  Food Insecurity: Not on file  Transportation Needs: Not on file  Physical Activity: Not on file  Stress: Not on file  Social Connections: Not on file  Intimate Partner Violence: Not on file   Current Meds  Medication Sig  . albuterol (PROAIR HFA) 108 (90 Base) MCG/ACT inhaler Inhale 2 puffs into the lungs every 6 (six) hours as needed for wheezing or shortness of breath.  . Blood Glucose Monitoring Suppl (  ONETOUCH VERIO REFLECT) w/Device KIT 1 Device by Does not apply route daily.  Marland Kitchen EPINEPHrine 0.3 mg/0.3 mL IJ SOAJ injection epinephrine 0.3 mg/0.3 mL injection, auto-injector  INJECT INTRAMUSCULARLY AS DIRECTED  . escitalopram (LEXAPRO) 10 MG tablet TAKE 1 TABLET(10 MG) BY MOUTH DAILY  . glucose blood (ONETOUCH VERIO) test strip Used to check blood sugars twice daily.  . ISIBLOOM 0.15-30 MG-MCG tablet TAKE 1 TABLET BY MOUTH DAILY  . Lancets (ONETOUCH ULTRASOFT) lancets Used to check blood sugars twice daily.  Marland Kitchen levocetirizine (XYZAL) 5 MG tablet Take 5 mg by mouth every evening.  . Levonorgestrel-Ethinyl Estradiol (AMETHIA) 0.15-0.03 &0.01 MG tablet Take 1 tablet by mouth daily.  Marland Kitchen  losartan-hydrochlorothiazide (HYZAAR) 50-12.5 MG tablet Take 1 tablet by mouth daily. In am  . metFORMIN (GLUCOPHAGE-XR) 500 MG 24 hr tablet TAKE 4 TABLETS(2000 MG) BY MOUTH EVERY EVENING  . montelukast (SINGULAIR) 10 MG tablet TAKE 1 TABLET(10 MG) BY MOUTH AT BEDTIME  . QVAR 80 MCG/ACT inhaler TAKE 2 PUFFS FIRST THING IN THE MORNING AND THEN  ANOTHER 2 PUFFS ABOUT 12  HOURS LATER.  . TRULICITY 0.25 EN/2.7PO SOPN ADMINISTER 0.75 MG UNDER THE SKIN 1 TIME A WEEK  . [DISCONTINUED] Levonorgestrel-Ethinyl Estradiol (AMETHIA) 0.15-0.03 &0.01 MG tablet Take 1 tablet by mouth at bedtime.  . [DISCONTINUED] losartan (COZAAR) 25 MG tablet Take 1 tablet (25 mg total) by mouth daily.   Allergies  Allergen Reactions  . Augmentin [Amoxicillin-Pot Clavulanate] Nausea Only   Recent Results (from the past 2160 hour(s))  SARS CORONAVIRUS 2 (TAT 6-24 HRS) Nasopharyngeal Nasopharyngeal Swab     Status: None   Collection Time: 12/25/20  2:09 PM   Specimen: Nasopharyngeal Swab  Result Value Ref Range   SARS Coronavirus 2 NEGATIVE NEGATIVE    Comment: (NOTE) SARS-CoV-2 target nucleic acids are NOT DETECTED.  The SARS-CoV-2 RNA is generally detectable in upper and lower respiratory specimens during the acute phase of infection. Negative results do not preclude SARS-CoV-2 infection, do not rule out co-infections with other pathogens, and should not be used as the sole basis for treatment or other patient management decisions. Negative results must be combined with clinical observations, patient history, and epidemiological information. The expected result is Negative.  Fact Sheet for Patients: SugarRoll.be  Fact Sheet for Healthcare Providers: https://www.woods-mathews.com/  This test is not yet approved or cleared by the [place] FDA and  has been authorized for detection and/or diagnosis of SARS-CoV-2 by FDA under an Emergency Use Authorization (EUA). This  EUA will remain  in effect (meaning this test can be used) for the duration of the COVID-19 declaration under Se ction 564(b)(1) of the Act, 21 U.S.C. section 360bbb-3(b)(1), unless the authorization is terminated or revoked sooner.  Performed at [facility], [place] [address], [place], [place] [address]    Objective  Body mass index is 39.24 kg/m. Wt Readings from Last 3 Encounters:  12/28/20 243 lb 2 oz (110.3 kg)  12/25/20 250 lb (113.4 kg)  10/26/20 246 lb 4.8 oz (111.7 kg)   Temp Readings from Last 3 Encounters:  12/28/20 97.9 F (36.6 C)  12/25/20 98.2 F (36.8 C) (Oral)  07/22/20 99.1 F (37.3 C)   BP Readings from Last 3 Encounters:  12/28/20 (!) 130/98  12/25/20 (!) 148/104  10/26/20 (!) 132/96   Pulse Readings from Last 3 Encounters:  12/28/20 90  12/25/20 90  10/26/20 97    Physical Exam Vitals and nursing note reviewed.  Constitutional:  Appearance: Normal appearance. She is well-developed and well-groomed. She is obese.  HENT:     Head: Normocephalic and atraumatic.  Cardiovascular:     Rate and Rhythm: Normal rate and regular rhythm.     Heart sounds: Normal heart sounds. No murmur heard.   Pulmonary:     Effort: Pulmonary effort is normal.     Breath sounds: Normal breath sounds.  Skin:    General: Skin is warm and dry.  Neurological:     General: No focal deficit present.     Mental Status: She is alert and oriented to person, place, and time. Mental status is at baseline.     Gait: Gait normal.  Psychiatric:        Attention and Perception: Attention and perception normal.        Mood and Affect: Mood and affect normal.        Speech: Speech normal.        Behavior: Behavior normal. Behavior is cooperative.        Thought Content: Thought content normal.        Cognition and Memory: Cognition and memory normal.        Judgment: Judgment normal.     Assessment  Plan  Primary hypertension - Plan: losartan-hydrochlorothiazide  (HYZAAR) 50-12.5 MG tablet  In am stop losartan 25 mg qd  Log BP  F/u with PCP in 2-3 weeks  Given high K food list  rec healthy diet and exercise  Consider [facility] wt [facility] in the future with nutrition  Fasting labs in 1-2 weeks before PCP f/u   Pap/gyn care  [facility]  Pap is due Asked ob/gyn if OCP could be raising BP and to comment  Consider HPV vaccines with ob/gyn if not had   Provider: Dr. Olivia Mackie McLean-Scocuzza-[facility] Medicine

## 2021-01-07 ENCOUNTER — Other Ambulatory Visit (INDEPENDENT_AMBULATORY_CARE_PROVIDER_SITE_OTHER): Payer: PRIVATE HEALTH INSURANCE

## 2021-01-07 ENCOUNTER — Other Ambulatory Visit: Payer: Self-pay

## 2021-01-07 DIAGNOSIS — E559 Vitamin D deficiency, unspecified: Secondary | ICD-10-CM | POA: Diagnosis not present

## 2021-01-07 DIAGNOSIS — Z1329 Encounter for screening for other suspected endocrine disorder: Secondary | ICD-10-CM | POA: Diagnosis not present

## 2021-01-07 DIAGNOSIS — I152 Hypertension secondary to endocrine disorders: Secondary | ICD-10-CM

## 2021-01-07 DIAGNOSIS — E538 Deficiency of other specified B group vitamins: Secondary | ICD-10-CM | POA: Diagnosis not present

## 2021-01-07 DIAGNOSIS — I10 Essential (primary) hypertension: Secondary | ICD-10-CM | POA: Diagnosis not present

## 2021-01-07 DIAGNOSIS — E1159 Type 2 diabetes mellitus with other circulatory complications: Secondary | ICD-10-CM | POA: Diagnosis not present

## 2021-01-07 LAB — CBC WITH DIFFERENTIAL/PLATELET
Basophils Absolute: 0.1 10*3/uL (ref 0.0–0.1)
Basophils Relative: 0.8 % (ref 0.0–3.0)
Eosinophils Absolute: 0.1 10*3/uL (ref 0.0–0.7)
Eosinophils Relative: 2.1 % (ref 0.0–5.0)
HCT: 36.2 % (ref 36.0–46.0)
Hemoglobin: 12.2 g/dL (ref 12.0–15.0)
Lymphocytes Relative: 31.7 % (ref 12.0–46.0)
Lymphs Abs: 2.2 10*3/uL (ref 0.7–4.0)
MCHC: 33.6 g/dL (ref 30.0–36.0)
MCV: 83.6 fl (ref 78.0–100.0)
Monocytes Absolute: 0.4 10*3/uL (ref 0.1–1.0)
Monocytes Relative: 6.3 % (ref 3.0–12.0)
Neutro Abs: 4 10*3/uL (ref 1.4–7.7)
Neutrophils Relative %: 59.1 % (ref 43.0–77.0)
Platelets: 340 10*3/uL (ref 150.0–400.0)
RBC: 4.33 Mil/uL (ref 3.87–5.11)
RDW: 15.5 % (ref 11.5–15.5)
WBC: 6.8 10*3/uL (ref 4.0–10.5)

## 2021-01-07 LAB — LIPID PANEL
Cholesterol: 173 mg/dL (ref 0–200)
HDL: 37.2 mg/dL — ABNORMAL LOW (ref >39.00)
Total CHOL/HDL Ratio: 5
Triglycerides: 441 mg/dL — ABNORMAL HIGH (ref 0.0–149.0)

## 2021-01-07 LAB — COMPREHENSIVE METABOLIC PANEL
ALT: 28 U/L (ref 0–35)
AST: 28 U/L (ref 0–37)
Albumin: 4 g/dL (ref 3.5–5.2)
Alkaline Phosphatase: 60 U/L (ref 39–117)
BUN: 12 mg/dL (ref 6–23)
CO2: 24 mEq/L (ref 19–32)
Calcium: 9.5 mg/dL (ref 8.4–10.5)
Chloride: 102 mEq/L (ref 96–112)
Creatinine, Ser: 0.66 mg/dL (ref 0.40–1.20)
GFR: 120.99 mL/min (ref >60.00)
Glucose, Bld: 180 mg/dL — ABNORMAL HIGH (ref 70–99)
Potassium: 4.3 mEq/L (ref 3.5–5.1)
Sodium: 139 mEq/L (ref 135–145)
Total Bilirubin: 0.4 mg/dL (ref 0.2–1.2)
Total Protein: 7.3 g/dL (ref 6.0–8.3)

## 2021-01-07 LAB — VITAMIN D 25 HYDROXY (VIT D DEFICIENCY, FRACTURES): VITD: 30.12 ng/mL (ref 30.00–100.00)

## 2021-01-07 LAB — HEMOGLOBIN A1C: Hgb A1c MFr Bld: 9.1 % — ABNORMAL HIGH (ref 4.6–6.5)

## 2021-01-07 LAB — LDL CHOLESTEROL, DIRECT: Direct LDL: 77 mg/dL

## 2021-01-07 LAB — TSH: TSH: 3.33 u[IU]/mL (ref 0.35–4.50)

## 2021-01-07 LAB — VITAMIN B12: Vitamin B-12: 214 pg/mL (ref 211–911)

## 2021-01-07 NOTE — Addendum Note (Signed)
Addended by: Warden Fillers on: 01/07/2021 02:56 PM   Modules accepted: Orders

## 2021-01-11 NOTE — Progress Notes (Signed)
Subjective:    [name], female    DOB: 1996-05-15, 26 y.o.   MRN: [id number]  CC: Amy Olson is a 25 y.o. female who presents today for follow up.   HPI:  Feels well today. No complaints.   She has starting walking almost daily and decrease sodium intake. She is no longer drinking soft drinks.   HTN- compliant with hyzaar . HA and dizziness has resolved.   She is not trying to get pregnant, she is compliant with OCP.   DM-she is not checking blood sugars. compliant with metformin 7948AX /day, trulicity   Depression- feels well on lexapro 37m. She feels dose is adequate.   Due for eye eyam   B12 low normal - she is compliant with  B12 1000 mcg otc   Vitamin D low - she is compliant with vitamin D3 2000 IU daily otc   Pap is scheduled with GYN.       HISTORY:  Past Medical History:  Diagnosis Date  . allergies   . Allergy   . Asthma   . Depression   . [place]    prediabetic   Past Surgical History:  Procedure Laterality Date  . ADENOIDECTOMY     1998- only adenoids   Family History  Problem Relation Age of Onset  . Rheum arthritis Maternal Grandmother   . Hyperlipidemia Maternal Grandmother   . Hypertension Maternal Grandmother   . Arthritis Maternal Grandmother   . Memory loss Maternal Grandmother   . Hyperlipidemia Mother   . Hypertension Father   . Diabetes Brother   . Hyperlipidemia Maternal Grandfather   . Hypertension Maternal Grandfather   . Cystic kidney disease Maternal Grandfather        polycystic kidney disorder   . Colon cancer Paternal Grandmother 633 . Hypertension Paternal Grandfather   . Dementia Paternal Great-grandmother   . Cystic kidney disease Maternal Aunt   . Breast cancer Neg Hx   . Thyroid cancer Neg Hx     Allergies: Augmentin [amoxicillin-pot clavulanate] Current Outpatient Medications on File Prior to Visit  Medication Sig Dispense Refill  . albuterol (PROAIR  HFA) 108 (90 Base) MCG/ACT inhaler Inhale 2 puffs into the lungs every 6 (six) hours as needed for wheezing or shortness of breath. 18 g 1  . Blood Glucose Monitoring Suppl (ONETOUCH VERIO REFLECT) w/Device KIT 1 Device by Does not apply route daily. 1 kit 0  . EPINEPHrine 0.3 mg/0.3 mL IJ SOAJ injection epinephrine 0.3 mg/0.3 mL injection, auto-injector  INJECT INTRAMUSCULARLY AS DIRECTED    . escitalopram (LEXAPRO) 10 MG tablet TAKE 1 TABLET(10 MG) BY MOUTH DAILY 90 tablet 1  . glucose blood (ONETOUCH VERIO) test strip Used to check blood sugars twice daily. 100 each 12  . ISIBLOOM 0.15-30 MG-MCG tablet TAKE 1 TABLET BY MOUTH DAILY 84 tablet 3  . Lancets (ONETOUCH ULTRASOFT) lancets Used to check blood sugars twice daily. 100 each 12  . levocetirizine (XYZAL) 5 MG tablet Take 5 mg by mouth every evening.    . Levonorgestrel-Ethinyl Estradiol (AMETHIA) 0.15-0.03 &0.01 MG tablet Take 1 tablet by mouth daily.    .Marland Kitchenlosartan-hydrochlorothiazide (HYZAAR) 50-12.5 MG tablet Take 1 tablet by mouth daily. In am 90 tablet 3  . metFORMIN (GLUCOPHAGE-XR) 500 MG 24 hr tablet TAKE 4 TABLETS(2000 MG) BY MOUTH EVERY EVENING 120 tablet 3  . montelukast (SINGULAIR) 10 MG tablet TAKE 1 TABLET(10 MG) BY MOUTH AT BEDTIME 90  tablet 3  . QVAR 80 MCG/ACT inhaler TAKE 2 PUFFS FIRST THING IN THE MORNING AND THEN  ANOTHER 2 PUFFS ABOUT 12  HOURS LATER. 3 Inhaler 0   No current facility-administered medications on file prior to visit.    Social History   Tobacco Use  . Smoking status: Never Smoker  . Smokeless tobacco: Never Used  Vaping Use  . Vaping Use: Never used  Substance Use Topics  . Alcohol use: Yes    Alcohol/week: 1.0 - 2.0 standard drink    Types: 1 - 2 Standard drinks or equivalent per week    Comment: rare  . Drug use: No    Review of Systems  Constitutional: Negative for chills and fever.  Respiratory: Negative for cough.   Cardiovascular: Negative for chest pain and palpitations.   Gastrointestinal: Negative for nausea and vomiting.      Objective:    BP 118/82   Pulse 79   Temp 97.9 F (36.6 C)   Ht $R'5\' 6"'mN$  (1.676 m)   Wt 239 lb 12.8 oz (108.8 kg)   SpO2 98%   BMI 38.70 kg/m  BP Readings from Last 3 Encounters:  01/21/21 118/82  12/28/20 (!) 130/98  12/25/20 (!) 148/104   Wt Readings from Last 3 Encounters:  01/21/21 239 lb 12.8 oz (108.8 kg)  12/28/20 243 lb 2 oz (110.3 kg)  12/25/20 250 lb (113.4 kg)    Physical Exam Vitals reviewed.  Constitutional:      Appearance: She is well-developed.  Eyes:     Conjunctiva/sclera: Conjunctivae normal.  Cardiovascular:     Rate and Rhythm: Normal rate and regular rhythm.     Pulses: Normal pulses.     Heart sounds: Normal heart sounds.  Pulmonary:     Effort: Pulmonary effort is normal.     Breath sounds: Normal breath sounds. No wheezing, rhonchi or rales.  Skin:    General: Skin is warm and dry.  Neurological:     Mental Status: She is alert.  Psychiatric:        Speech: Speech normal.        Behavior: Behavior normal.        Thought Content: Thought content normal.        Assessment & Plan:   Problem List Items Addressed This Visit      Cardiovascular and Mediastinum   HTN (hypertension)    Controlled. Continue losartan -hctz 50-$RemoveBeforeDEI'25mg'CLXQjtnRAChxMvrM$ . Will continue to monitor BP closely while on OCP. Counseled regarding  teratogenicity of losartan-hctz and if ever she has stopped or missed OCP, concerns of being pregnant to let me know immediately. She stated she has no immediate plans for pregnancy.        Endocrine   Diabetes mellitus without complication ([place]) - Primary    Lab Results  Component Value Date   HGBA1C 9.1 (H) 01/07/2021         Relevant Medications   Dulaglutide (TRULICITY) 1.5 JY/7.8GN SOPN     Other   Depression    Controlled. Continue lexapro $RemoveBeforeDE'10mg'uEpPHzEIHGUIQyQ$        Other Visit Diagnoses    Hypertension associated with diabetes ([place])       Relevant Medications   Dulaglutide  (TRULICITY) 1.5 FA/2.1HY SOPN       I have discontinued Creig Hines Blust's Trulicity. I am also having her start on Trulicity. Additionally, I am having her maintain her Qvar, EPINEPHrine, levocetirizine, albuterol, OneTouch Verio Reflect, OneTouch Verio, onetouch ultrasoft, montelukast, metFORMIN, Isibloom, escitalopram,  losartan-hydrochlorothiazide, and Levonorgestrel-Ethinyl Estradiol.   Meds ordered this encounter  Medications  . Dulaglutide (TRULICITY) 1.5 DH/7.8XB SOPN    Sig: Inject 1.5 mg into the skin once a week.    Dispense:  3 mL    Refill:  3    Order Specific Question:   Supervising Provider    Answer:   Crecencio Mc [[id number]]    Return precautions given.   Risks, benefits, and alternatives of the medications and treatment plan prescribed today were discussed, and patient expressed understanding.   Education regarding symptom management and diagnosis given to patient on AVS.  Continue to follow with Burnard Hawthorne, FNP for routine health maintenance.   Rockwell Alexandria and I agreed with plan.   Mable Paris, FNP

## 2021-01-21 ENCOUNTER — Other Ambulatory Visit: Payer: Self-pay

## 2021-01-21 ENCOUNTER — Encounter: Payer: Self-pay | Admitting: Family

## 2021-01-21 ENCOUNTER — Ambulatory Visit: Payer: PRIVATE HEALTH INSURANCE | Admitting: Family

## 2021-01-21 VITALS — BP 118/82 | HR 79 | Temp 97.9°F | Ht 66.0 in | Wt 239.8 lb

## 2021-01-21 DIAGNOSIS — I152 Hypertension secondary to endocrine disorders: Secondary | ICD-10-CM | POA: Diagnosis not present

## 2021-01-21 DIAGNOSIS — I10 Essential (primary) hypertension: Secondary | ICD-10-CM | POA: Diagnosis not present

## 2021-01-21 DIAGNOSIS — F32A Depression, unspecified: Secondary | ICD-10-CM

## 2021-01-21 DIAGNOSIS — E1159 Type 2 diabetes mellitus with other circulatory complications: Secondary | ICD-10-CM | POA: Diagnosis not present

## 2021-01-21 DIAGNOSIS — E119 Type 2 diabetes mellitus without complications: Secondary | ICD-10-CM

## 2021-01-21 MED ORDER — TRULICITY 1.5 MG/0.5ML ~~LOC~~ SOAJ: 1.5000 mg | SUBCUTANEOUS | 3 refills | Status: DC

## 2021-01-21 NOTE — Addendum Note (Signed)
Addended by: Warden Fillers on: 01/21/2021 02:40 PM   Modules accepted: Orders

## 2021-01-21 NOTE — Assessment & Plan Note (Signed)
Lab Results  Component Value Date   HGBA1C 9.1 (H) 01/07/2021

## 2021-01-21 NOTE — Patient Instructions (Addendum)
Nice to see you!  Increase trulicity to 1.5mg 

## 2021-01-22 LAB — MICROALBUMIN / CREATININE URINE RATIO
Creatinine, Urine: 143 mg/dL (ref 20–275)
Microalb Creat Ratio: 6 mcg/mg creat (ref <30)
Microalb, Ur: 0.9 mg/dL

## 2021-01-22 LAB — URINALYSIS, ROUTINE W REFLEX MICROSCOPIC
Bilirubin Urine: NEGATIVE
Glucose, UA: NEGATIVE
Hgb urine dipstick: NEGATIVE
Ketones, ur: NEGATIVE
Leukocytes,Ua: NEGATIVE
Nitrite: NEGATIVE
Protein, ur: NEGATIVE
Specific Gravity, Urine: 1.019 (ref 1.001–1.03)
pH: <=5 (ref 5.0–8.0)

## 2021-01-24 ENCOUNTER — Encounter: Payer: Self-pay | Admitting: Family

## 2021-01-24 DIAGNOSIS — I10 Essential (primary) hypertension: Secondary | ICD-10-CM | POA: Insufficient documentation

## 2021-01-24 NOTE — Assessment & Plan Note (Addendum)
Controlled. Continue losartan -hctz 50-25mg . Will continue to monitor BP closely while on OCP. Counseled regarding  teratogenicity of losartan-hctz and if ever she has stopped or missed OCP, concerns of being pregnant to let me know immediately. She stated she has no immediate plans for pregnancy.

## 2021-01-24 NOTE — Assessment & Plan Note (Signed)
Controlled. Continue lexapro 10mg 

## 2021-01-25 ENCOUNTER — Other Ambulatory Visit: Payer: Self-pay

## 2021-01-25 NOTE — Progress Notes (Signed)
LMTCB to let me know which one should be d/c.

## 2021-01-26 ENCOUNTER — Other Ambulatory Visit: Payer: Self-pay

## 2021-01-26 ENCOUNTER — Telehealth: Payer: Self-pay | Admitting: Family

## 2021-01-26 NOTE — Progress Notes (Signed)
Pt called back in regards to [facility]. I have deleted BC that patient is not taking.

## 2021-01-26 NOTE — Telephone Encounter (Signed)
Pt called and informed of results.

## 2021-01-26 NOTE — Telephone Encounter (Signed)
Patient was returning call for lab results 

## 2021-02-08 ENCOUNTER — Encounter: Payer: Self-pay | Admitting: Obstetrics and Gynecology

## 2021-02-08 ENCOUNTER — Other Ambulatory Visit: Payer: Self-pay

## 2021-02-08 ENCOUNTER — Ambulatory Visit (INDEPENDENT_AMBULATORY_CARE_PROVIDER_SITE_OTHER): Payer: PRIVATE HEALTH INSURANCE | Admitting: Obstetrics and Gynecology

## 2021-02-08 ENCOUNTER — Other Ambulatory Visit (HOSPITAL_COMMUNITY)
Admission: RE | Admit: 2021-02-08 | Discharge: 2021-02-08 | Disposition: A | Discharge status: Home / Self Care | Payer: PRIVATE HEALTH INSURANCE | Source: Ambulatory Visit | Attending: Obstetrics and Gynecology | Admitting: Obstetrics and Gynecology

## 2021-02-08 VITALS — BP 117/78 | HR 101 | Ht 66.0 in | Wt 240.5 lb

## 2021-02-08 DIAGNOSIS — Z01419 Encounter for gynecological examination (general) (routine) without abnormal findings: Secondary | ICD-10-CM | POA: Insufficient documentation

## 2021-02-08 MED ORDER — LEVONORGEST-ETH ESTRAD 91-DAY 0.15-0.03 &0.01 MG PO TABS: 1.0000 | ORAL_TABLET | Freq: Every day | ORAL | 3 refills | Status: DC

## 2021-02-08 NOTE — Addendum Note (Signed)
Addended by: Marchelle Folks on: 02/08/2021 03:07 PM   Modules accepted: Orders

## 2021-02-08 NOTE — Progress Notes (Signed)
HPI:      [name] is a 25 y.o. G0P0000 who LMP was Patient's last menstrual period was 01/20/2021 (approximate).  Subjective:   She presents today for her annual examination.  We changed her OCPs at her last visit and she is now having menses every 3 months with Seasonique.  She is very happy with this new method. She has also been taking losartan for blood pressure control and this is working well for her.    Hx: The following portions of the patient's history were reviewed and updated as appropriate:             She  has a past medical history of allergies, Allergy, Asthma, Depression, Diabetes mellitus without complication (HCC), and Hypertension. She does not have any pertinent problems on file. She  has a past surgical history that includes Adenoidectomy. Her family history includes Arthritis in her maternal grandmother; Colon cancer (age of onset: 20) in her paternal grandmother; Cystic kidney disease in her maternal aunt and maternal grandfather; Dementia in her paternal great-grandmother; Diabetes in her brother; Hyperlipidemia in her maternal grandfather, maternal grandmother, and mother; Hypertension in her father, maternal grandfather, maternal grandmother, and paternal grandfather; Memory loss in her maternal grandmother; Rheum arthritis in her maternal grandmother. She  reports that she has never smoked. She has never used smokeless tobacco. She reports current alcohol use of about 1.0 - 2.0 standard drink of alcohol per week. She reports that she does not use drugs. She has a current medication list which includes the following prescription(s): albuterol, trulicity, epinephrine, escitalopram, levocetirizine, levonorgestrel-ethinyl estradiol, losartan-hydrochlorothiazide, metformin, montelukast, and qvar. She is allergic to augmentin [amoxicillin-pot clavulanate].       Review of Systems:  Review of Systems  Constitutional: Denied constitutional symptoms, night sweats,  recent illness, fatigue, fever, insomnia and weight loss.  Eyes: Denied eye symptoms, eye pain, photophobia, vision change and visual disturbance.  Ears/Nose/Throat/Neck: Denied ear, nose, throat or neck symptoms, hearing loss, nasal discharge, sinus congestion and sore throat.  Cardiovascular: Denied cardiovascular symptoms, arrhythmia, chest pain/pressure, edema, exercise intolerance, orthopnea and palpitations.  Respiratory: Denied pulmonary symptoms, asthma, pleuritic pain, productive sputum, cough, dyspnea and wheezing.  Gastrointestinal: Denied, gastro-esophageal reflux, melena, nausea and vomiting.  Genitourinary: Denied genitourinary symptoms including symptomatic vaginal discharge, pelvic relaxation issues, and urinary complaints.  Musculoskeletal: Denied musculoskeletal symptoms, stiffness, swelling, muscle weakness and myalgia.  Dermatologic: Denied dermatology symptoms, rash and scar.  Neurologic: Denied neurology symptoms, dizziness, headache, neck pain and syncope.  Psychiatric: Denied psychiatric symptoms, anxiety and depression.  Endocrine: Denied endocrine symptoms including hot flashes and night sweats.   Meds:   Current Outpatient Medications on File Prior to Visit  Medication Sig Dispense Refill  . albuterol (PROAIR HFA) 108 (90 Base) MCG/ACT inhaler Inhale 2 puffs into the lungs every 6 (six) hours as needed for wheezing or shortness of breath. 18 g 1  . Dulaglutide (TRULICITY) 1.5 MG/0.5ML SOPN Inject 1.5 mg into the skin once a week. 3 mL 3  . EPINEPHrine 0.3 mg/0.3 mL IJ SOAJ injection epinephrine 0.3 mg/0.3 mL injection, auto-injector  INJECT INTRAMUSCULARLY AS DIRECTED    . escitalopram (LEXAPRO) 10 MG tablet TAKE 1 TABLET(10 MG) BY MOUTH DAILY 90 tablet 1  . levocetirizine (XYZAL) 5 MG tablet Take 5 mg by mouth every evening.    . Levonorgestrel-Ethinyl Estradiol (AMETHIA) 0.15-0.03 &0.01 MG tablet Take 1 tablet by mouth daily.    Marland Kitchen losartan-hydrochlorothiazide  (HYZAAR) 50-12.5 MG tablet Take 1 tablet by  mouth daily. In am 90 tablet 3  . metFORMIN (GLUCOPHAGE-XR) 500 MG 24 hr tablet TAKE 4 TABLETS(2000 MG) BY MOUTH EVERY EVENING 120 tablet 3  . montelukast (SINGULAIR) 10 MG tablet TAKE 1 TABLET(10 MG) BY MOUTH AT BEDTIME 90 tablet 3  . QVAR 80 MCG/ACT inhaler TAKE 2 PUFFS FIRST THING IN THE MORNING AND THEN  ANOTHER 2 PUFFS ABOUT 12  HOURS LATER. 3 Inhaler 0   No current facility-administered medications on file prior to visit.       Upstream - 02/08/21 1450      Pregnancy Intention Screening   Does the patient want to become pregnant in the next year? No    Does the patient's partner want to become pregnant in the next year? N/A    Would the patient like to discuss contraceptive options today? No      Contraception Wrap Up   Current Method Oral Contraceptive    End Method Oral Contraceptive          The pregnancy intention screening data noted above was reviewed. Potential methods of contraception were discussed. The patient elected to proceed with Oral Contraceptive.     Objective:     Vitals:   02/08/21 1444  BP: 117/78  Pulse: (!) 101    Filed Weights   02/08/21 1444  Weight: 240 lb 8 oz (109.1 kg)              Physical examination General NAD, Conversant  HEENT Atraumatic; Op clear with mmm.  Normo-cephalic. Pupils reactive. Anicteric sclerae  Thyroid/Neck Smooth without nodularity or enlargement. Normal ROM.  Neck Supple.  Skin No rashes, lesions or ulceration. Normal palpated skin turgor. No nodularity.  Breasts: No masses or discharge.  Symmetric.  No axillary adenopathy.  Lungs: Clear to auscultation.No rales or wheezes. Normal Respiratory effort, no retractions.  Heart: NSR.  No murmurs or rubs appreciated. No periferal edema  Abdomen: Soft.  Non-tender.  No masses.  No HSM. No hernia  Extremities: Moves all appropriately.  Normal ROM for age. No lymphadenopathy.  Neuro: Oriented to PPT.  Normal mood. Normal  affect.     Pelvic:   Vulva: Normal appearance.  No lesions.  Vagina: No lesions or abnormalities noted.  Support: Normal pelvic support.  Urethra No masses tenderness or scarring.  Meatus Normal size without lesions or prolapse.  Cervix: Normal appearance.  No lesions.  Anus: Normal exam.  No lesions.  Perineum: Normal exam.  No lesions.        Bimanual   Uterus: Normal size.  Non-tender.  Mobile.  AV.  Adnexae: No masses.  Non-tender to palpation.  Cul-de-sac: Negative for abnormality.      Assessment:    G0P0000 Patient Active Problem List   Diagnosis Date Noted  . HTN (hypertension) 01/24/2021  . Breakthrough bleeding on OCPs 05/03/2020  . Elevated blood pressure reading 05/03/2020  . Open wound of finger 01/26/2020  . Diabetes mellitus without complication (HCC) 07/04/2016  . Depression 12/04/2015  . Asthma 09/15/2013     1. Well woman exam with routine gynecological exam     Patient doing well.  Would like to continue OCPs.   Plan:            1.  Basic Screening Recommendations The basic screening recommendations for asymptomatic women were discussed with the patient during her visit.  The age-appropriate recommendations were discussed with her and the rational for the tests reviewed.  When I am informed by the  patient that another primary care physician has previously obtained the age-appropriate tests and they are up-to-date, only outstanding tests are ordered and referrals given as necessary.  Abnormal results of tests will be discussed with her when all of her results are completed.  Routine preventative health maintenance measures emphasized: Exercise/Diet/Weight control, Tobacco Warnings, Alcohol/Substance use risks and Stress Management Pap performed 2.  Continue Seasonique Orders No orders of the defined types were placed in this encounter.   No orders of the defined types were placed in this encounter.           F/U  Return in about 1 year (around  02/08/2022) for Annual Physical.  Elonda Husky, M.D. 02/08/2021 3:03 PM

## 2021-02-10 LAB — CYTOLOGY - PAP: Diagnosis: NEGATIVE

## 2021-02-24 ENCOUNTER — Other Ambulatory Visit: Payer: Self-pay | Admitting: Family

## 2021-02-24 DIAGNOSIS — F418 Other specified anxiety disorders: Secondary | ICD-10-CM

## 2021-02-24 DIAGNOSIS — R413 Other amnesia: Secondary | ICD-10-CM

## 2021-03-21 ENCOUNTER — Other Ambulatory Visit: Payer: Self-pay | Admitting: Family

## 2021-04-05 LAB — HM DIABETES EYE EXAM

## 2021-04-14 ENCOUNTER — Encounter: Payer: Self-pay | Admitting: Family

## 2021-04-18 ENCOUNTER — Other Ambulatory Visit: Payer: Self-pay | Admitting: Family

## 2021-04-18 ENCOUNTER — Other Ambulatory Visit: Payer: Self-pay

## 2021-04-18 DIAGNOSIS — E1159 Type 2 diabetes mellitus with other circulatory complications: Secondary | ICD-10-CM

## 2021-04-18 MED ORDER — LOSARTAN POTASSIUM 50 MG PO TABS: 50.0000 mg | ORAL_TABLET | Freq: Every day | ORAL | 1 refills | Status: DC

## 2021-04-29 ENCOUNTER — Encounter: Payer: Self-pay | Admitting: Family

## 2021-04-29 ENCOUNTER — Other Ambulatory Visit: Payer: Self-pay

## 2021-04-29 ENCOUNTER — Ambulatory Visit (INDEPENDENT_AMBULATORY_CARE_PROVIDER_SITE_OTHER): Payer: No Typology Code available for payment source | Admitting: Family

## 2021-04-29 VITALS — BP 114/80 | HR 105 | Temp 98.0°F | Ht 66.0 in | Wt 238.2 lb

## 2021-04-29 DIAGNOSIS — F32A Depression, unspecified: Secondary | ICD-10-CM

## 2021-04-29 DIAGNOSIS — I10 Essential (primary) hypertension: Secondary | ICD-10-CM | POA: Diagnosis not present

## 2021-04-29 DIAGNOSIS — Z23 Encounter for immunization: Secondary | ICD-10-CM

## 2021-04-29 DIAGNOSIS — E119 Type 2 diabetes mellitus without complications: Secondary | ICD-10-CM | POA: Diagnosis not present

## 2021-04-29 LAB — POCT GLYCOSYLATED HEMOGLOBIN (HGB A1C): Hemoglobin A1C: 9.9 % — AB (ref 4.0–5.6)

## 2021-04-29 MED ORDER — TRULICITY 4.5 MG/0.5ML ~~LOC~~ SOAJ: 4.5000 mg | SUBCUTANEOUS | 3 refills | Status: DC

## 2021-04-29 MED ORDER — TRULICITY 3 MG/0.5ML ~~LOC~~ SOAJ: 3.0000 mg | SUBCUTANEOUS | 0 refills | Status: DC

## 2021-04-29 NOTE — Progress Notes (Signed)
Subjective:    [name], female    DOB: 1996-01-10, 25 y.o.   MRN: [id number]  CC: Amy Olson is a 25 y.o. female who presents today for follow up.   HPI: Feels well today No new complaints  DM- compliant with metformin 2000mg  qd, trulicity 1.5mg .   She skips breakfast. Lunch is take out, pizza, for lunch. Dinner is rice krispies. Drinks decap coffee with sugar, water. Occasional soda such a sprite zero. Occasional sweet such as cookie.   She has had nutrition consult in the past and didn't find very helpful.   She notes illness 2 months ago, and she reports dietary indiscretion, eating out more.    She has stopped losartan hctz She is on losartan 50mg . No cp.  Depression- compliant with lexapro 10mg  and feels well on current dose.  She had first dose of HPV at 25 years old. She never returned for 3/3 vaccine   HISTORY:  Past Medical History:  Diagnosis Date   allergies    Allergy    Asthma    Depression    Diabetes mellitus without complication (HCC)    prediabetic   Hypertension    Past Surgical History:  Procedure Laterality Date   ADENOIDECTOMY     1998- only adenoids   Family History  Problem Relation Age of Onset   Rheum arthritis Maternal Grandmother    Hyperlipidemia Maternal Grandmother    Hypertension Maternal Grandmother    Arthritis Maternal Grandmother    Memory loss Maternal Grandmother    Hyperlipidemia Mother    Hypertension Father    Diabetes Brother    Hyperlipidemia Maternal Grandfather    Hypertension Maternal Grandfather    Cystic kidney disease Maternal Grandfather        polycystic kidney disorder    Colon cancer Paternal Grandmother 4   Hypertension Paternal Grandfather    Dementia Paternal Great-grandmother    Cystic kidney disease Maternal Aunt    Breast cancer Neg Hx    Thyroid cancer Neg Hx     Allergies: Augmentin [amoxicillin-pot clavulanate] Current Outpatient Medications on File Prior to Visit   Medication Sig Dispense Refill   albuterol (PROAIR HFA) 108 (90 Base) MCG/ACT inhaler Inhale 2 puffs into the lungs every 6 (six) hours as needed for wheezing or shortness of breath. 18 g 1   EPINEPHrine 0.3 mg/0.3 mL IJ SOAJ injection epinephrine 0.3 mg/0.3 mL injection, auto-injector  INJECT INTRAMUSCULARLY AS DIRECTED     escitalopram (LEXAPRO) 10 MG tablet TAKE 1 TABLET(10 MG) BY MOUTH DAILY 90 tablet 1   levocetirizine (XYZAL) 5 MG tablet Take 5 mg by mouth every evening.     Levonorgestrel-Ethinyl Estradiol (AMETHIA) 0.15-0.03 &0.01 MG tablet Take 1 tablet by mouth daily. 84 tablet 3   losartan (COZAAR) 50 MG tablet Take 1 tablet (50 mg total) by mouth daily. 30 tablet 1   metFORMIN (GLUCOPHAGE-XR) 500 MG 24 hr tablet TAKE 4 TABLETS(2000 MG) BY MOUTH EVERY EVENING 360 tablet 1   montelukast (SINGULAIR) 10 MG tablet TAKE 1 TABLET(10 MG) BY MOUTH AT BEDTIME 90 tablet 3   QVAR 80 MCG/ACT inhaler TAKE 2 PUFFS FIRST THING IN THE MORNING AND THEN  ANOTHER 2 PUFFS ABOUT 12  HOURS LATER. 3 Inhaler 0   No current facility-administered medications on file prior to visit.    Social History   Tobacco Use   Smoking status: Never   Smokeless tobacco: Never  Vaping Use   Vaping Use:  Never used  Substance Use Topics   Alcohol use: Yes    Alcohol/week: 1.0 - 2.0 standard drink    Types: 1 - 2 Standard drinks or equivalent per week    Comment: rare   Drug use: No    Review of Systems  Constitutional:  Negative for chills and fever.  Respiratory:  Negative for cough.   Cardiovascular:  Negative for chest pain and palpitations.  Gastrointestinal:  Negative for nausea and vomiting.     Objective:    BP 114/80 (BP Location: Left Arm, Patient Position: Sitting, Cuff Size: Large)   Pulse (!) 105   Temp 98 F (36.7 C) (Oral)   Ht 5\' 6"  (1.676 m)   Wt 238 lb 3.2 oz (108 kg)   SpO2 97%   BMI 38.45 kg/m  BP Readings from Last 3 Encounters:  04/29/21 114/80  02/08/21 117/78  01/21/21  118/82   Wt Readings from Last 3 Encounters:  04/29/21 238 lb 3.2 oz (108 kg)  02/08/21 240 lb 8 oz (109.1 kg)  01/21/21 239 lb 12.8 oz (108.8 kg)    Physical Exam Vitals reviewed.  Constitutional:      Appearance: She is well-developed.  Eyes:     Conjunctiva/sclera: Conjunctivae normal.  Cardiovascular:     Rate and Rhythm: Normal rate and regular rhythm.     Pulses: Normal pulses.     Heart sounds: Normal heart sounds.  Pulmonary:     Effort: Pulmonary effort is normal.     Breath sounds: Normal breath sounds. No wheezing, rhonchi or rales.  Skin:    General: Skin is warm and dry.  Neurological:     Mental Status: She is alert.  Psychiatric:        Speech: Speech normal.        Behavior: Behavior normal.        Thought Content: Thought content normal.       Assessment & Plan:   Problem List Items Addressed This Visit       Cardiovascular and Mediastinum   HTN (hypertension) - Primary    Controlled. Continue losartan 50mg          Endocrine   Diabetes mellitus without complication (HCC)    Lab Results  Component Value Date   HGBA1C 9.9 (A) 04/29/2021  Uncontrolled. Continue metformin 2000mg  qd. increase trulicity 3mg  and in 4 weeks, increase to 4.5mg  ( given printed script). Counseled on low glycemic diet. She will check FBG and post prandial a couple of times per week to gauge progress, and call me with escalations > 250.         Relevant Medications   Dulaglutide (TRULICITY) 3 MG/0.5ML SOPN   Dulaglutide (TRULICITY) 4.5 MG/0.5ML SOPN   Other Relevant Orders   POCT HgB A1C (Completed)     Other   Depression    Controlled. Continue lexapro 10mg        Other Visit Diagnoses     Need for HPV vaccination       Relevant Orders   HPV 9-valent vaccine,Recombinat (Completed)        I have discontinued Harari's Trulicity. I am also having her start on Trulicity and Trulicity. Additionally, I am having her maintain her Qvar, EPINEPHrine,  levocetirizine, albuterol, montelukast, Levonorgestrel-Ethinyl Estradiol, escitalopram, metFORMIN, and losartan.   Meds ordered this encounter  Medications   Dulaglutide (TRULICITY) 3 MG/0.5ML SOPN    Sig: Inject 3 mg as directed once a week. X 4 weeks.  Dispense:  3 mL    Refill:  0    Order Specific Question:   Supervising Provider    Answer:   Duncan Dull L [[id number]]   Dulaglutide (TRULICITY) 4.5 MG/0.5ML SOPN    Sig: Inject 4.5 mg as directed once a week. Start AFTER 4 weeks on trulicity 3mg  qwk    Dispense:  3 mL    Refill:  3    Order Specific Question:   Supervising Provider    Answer:   [[id number]]     Return precautions given.   Risks, benefits, and alternatives of the medications and treatment plan prescribed today were discussed, and patient expressed understanding.   Education regarding symptom management and diagnosis given to patient on AVS.  Continue to follow with Sherlene Shams, FNP for routine health maintenance.   Allegra Grana and I agreed with plan.   Marvis Repress, FNP

## 2021-04-29 NOTE — Assessment & Plan Note (Signed)
Lab Results  Component Value Date   HGBA1C 9.9 (A) 04/29/2021   Uncontrolled. Continue metformin 2000mg  qd. increase trulicity 3mg  and in 4 weeks, increase to 4.5mg  ( given printed script). Counseled on low glycemic diet. She will check FBG and post prandial a couple of times per week to gauge progress, and call me with escalations > 250.

## 2021-04-29 NOTE — Patient Instructions (Signed)
Increase trulicity to 3 In 4 weeks, start trulicity 4.5mg    Please check  blood sugars a couple of times of week fasting ( goal < 120) and after largest meal ( 2 hours after largest meal, goal < 180) .   me  Fasting blood sugar goal in the morning is greater than 80 and less than 120.    Call [facility] clinic if: BG < 70 or > 250.  BOTTOM line: if most of your blood sugars fall between 140-150 whether fasting in the morning, before meals, then we will reach our goal of an a1c of 6.5%,    If you have any symptoms of low blood sugar ( sweating, shakiness, lightheaded, dizzy) , please notify me. If you have a low blood sugar, please drink a glass of orange juice and recheck blood sugar every 5 minutes until you don't feel symptomatic AND blood sugar is above 80.   Call me with any concerns.

## 2021-04-29 NOTE — Assessment & Plan Note (Signed)
Controlled. Continue losartan 50mg  

## 2021-04-29 NOTE — Assessment & Plan Note (Signed)
Controlled. Continue lexapro 10mg 

## 2021-05-09 ENCOUNTER — Other Ambulatory Visit: Payer: Self-pay | Admitting: Family

## 2021-05-09 DIAGNOSIS — J4520 Mild intermittent asthma, uncomplicated: Secondary | ICD-10-CM

## 2021-05-09 DIAGNOSIS — Z0000 Encounter for general adult medical examination without abnormal findings: Secondary | ICD-10-CM

## 2021-05-24 ENCOUNTER — Other Ambulatory Visit: Payer: Self-pay | Admitting: Family

## 2021-05-31 ENCOUNTER — Other Ambulatory Visit (INDEPENDENT_AMBULATORY_CARE_PROVIDER_SITE_OTHER): Payer: No Typology Code available for payment source

## 2021-05-31 ENCOUNTER — Other Ambulatory Visit: Payer: PRIVATE HEALTH INSURANCE

## 2021-05-31 ENCOUNTER — Other Ambulatory Visit: Payer: Self-pay

## 2021-05-31 DIAGNOSIS — E1159 Type 2 diabetes mellitus with other circulatory complications: Secondary | ICD-10-CM | POA: Diagnosis not present

## 2021-05-31 DIAGNOSIS — I152 Hypertension secondary to endocrine disorders: Secondary | ICD-10-CM

## 2021-06-01 LAB — BASIC METABOLIC PANEL
BUN: 11 mg/dL (ref 6–23)
CO2: 22 mEq/L (ref 19–32)
Calcium: 9.6 mg/dL (ref 8.4–10.5)
Chloride: 103 mEq/L (ref 96–112)
Creatinine, Ser: 0.8 mg/dL (ref 0.40–1.20)
GFR: 101.34 mL/min (ref >60.00)
Glucose, Bld: 111 mg/dL — ABNORMAL HIGH (ref 70–99)
Potassium: 4.1 mEq/L (ref 3.5–5.1)
Sodium: 137 mEq/L (ref 135–145)

## 2021-06-07 ENCOUNTER — Other Ambulatory Visit: Payer: Self-pay | Admitting: Family

## 2021-06-07 DIAGNOSIS — E119 Type 2 diabetes mellitus without complications: Secondary | ICD-10-CM

## 2021-08-01 ENCOUNTER — Ambulatory Visit: Payer: No Typology Code available for payment source | Admitting: Family

## 2021-09-05 ENCOUNTER — Other Ambulatory Visit: Payer: Self-pay

## 2021-09-05 ENCOUNTER — Ambulatory Visit: Payer: No Typology Code available for payment source | Admitting: Family

## 2021-09-05 ENCOUNTER — Encounter: Payer: Self-pay | Admitting: Family

## 2021-09-05 VITALS — BP 118/82 | HR 93 | Temp 98.4°F | Ht 66.0 in | Wt 232.0 lb

## 2021-09-05 DIAGNOSIS — E119 Type 2 diabetes mellitus without complications: Secondary | ICD-10-CM

## 2021-09-05 DIAGNOSIS — I10 Essential (primary) hypertension: Secondary | ICD-10-CM | POA: Diagnosis not present

## 2021-09-05 LAB — POCT GLYCOSYLATED HEMOGLOBIN (HGB A1C): Hemoglobin A1C: 7.4 % — AB (ref 4.0–5.6)

## 2021-09-05 MED ORDER — TRULICITY 4.5 MG/0.5ML ~~LOC~~ SOAJ: 4.5000 mg | SUBCUTANEOUS | 3 refills | Status: DC

## 2021-09-05 NOTE — Assessment & Plan Note (Signed)
Well-controlled.  Continue losartan 50mg 

## 2021-09-05 NOTE — Progress Notes (Signed)
Subjective:    [name], female    DOB: Feb 18, 1996, 25 y.o.   MRN: [id number]  CC: Amy Olson is a 25 y.o. female who presents today for follow up.   HPI: Feels well today.  No new complaints She is overall pleased with Trulicity content her appetite is less.  She stays full for longer.  She is tolerating medication  DM- compliant with metformin 2000mg  qd, trulicity 3mg    HTN-compliant with losartan 50 mg. No cp, sob HISTORY:  Past Medical History:  Diagnosis Date   allergies    Allergy    Asthma    Depression    Diabetes mellitus without complication (HCC)    prediabetic   Hypertension    Past Surgical History:  Procedure Laterality Date   ADENOIDECTOMY     1998- only adenoids   Family History  Problem Relation Age of Onset   Rheum arthritis Maternal Grandmother    Hyperlipidemia Maternal Grandmother    Hypertension Maternal Grandmother    Arthritis Maternal Grandmother    Memory loss Maternal Grandmother    Hyperlipidemia Mother    Hypertension Father    Diabetes Brother    Hyperlipidemia Maternal Grandfather    Hypertension Maternal Grandfather    Cystic kidney disease Maternal Grandfather        polycystic kidney disorder    Colon cancer Paternal Grandmother 45   Hypertension Paternal Grandfather    Dementia Paternal Great-grandmother    Cystic kidney disease Maternal Aunt    Breast cancer Neg Hx    Thyroid cancer Neg Hx     Allergies: Augmentin [amoxicillin-pot clavulanate] Current Outpatient Medications on File Prior to Visit  Medication Sig Dispense Refill   albuterol (PROAIR HFA) 108 (90 Base) MCG/ACT inhaler Inhale 2 puffs into the lungs every 6 (six) hours as needed for wheezing or shortness of breath. 18 g 1   EPINEPHrine 0.3 mg/0.3 mL IJ SOAJ injection epinephrine 0.3 mg/0.3 mL injection, auto-injector  INJECT INTRAMUSCULARLY AS DIRECTED     escitalopram (LEXAPRO) 10 MG tablet TAKE 1 TABLET(10 MG) BY MOUTH DAILY 90 tablet 1    levocetirizine (XYZAL) 5 MG tablet Take 5 mg by mouth every evening.     Levonorgestrel-Ethinyl Estradiol (AMETHIA) 0.15-0.03 &0.01 MG tablet Take 1 tablet by mouth daily. 84 tablet 3   losartan (COZAAR) 50 MG tablet TAKE 1 TABLET(50 MG) BY MOUTH DAILY 90 tablet 0   metFORMIN (GLUCOPHAGE-XR) 500 MG 24 hr tablet TAKE 4 TABLETS(2000 MG) BY MOUTH EVERY EVENING 360 tablet 1   montelukast (SINGULAIR) 10 MG tablet TAKE 1 TABLET(10 MG) BY MOUTH AT BEDTIME 90 tablet 3   QVAR 80 MCG/ACT inhaler TAKE 2 PUFFS FIRST THING IN THE MORNING AND THEN  ANOTHER 2 PUFFS ABOUT 12  HOURS LATER. 3 Inhaler 0   No current facility-administered medications on file prior to visit.    Social History   Tobacco Use   Smoking status: Never   Smokeless tobacco: Never  Vaping Use   Vaping Use: Never used  Substance Use Topics   Alcohol use: Yes    Alcohol/week: 1.0 - 2.0 standard drink    Types: 1 - 2 Standard drinks or equivalent per week    Comment: rare   Drug use: No    Review of Systems  Constitutional:  Negative for chills and fever.  Respiratory:  Negative for cough.   Cardiovascular:  Negative for chest pain and palpitations.  Gastrointestinal:  Negative for nausea and  vomiting.     Objective:    BP 118/82 (BP Location: Left Arm, Patient Position: Sitting, Cuff Size: Large)   Pulse 93   Temp 98.4 F (36.9 C) (Oral)   Ht 5\' 6"  (1.676 m)   Wt 232 lb (105.2 kg)   SpO2 98%   BMI 37.45 kg/m  BP Readings from Last 3 Encounters:  09/05/21 118/82  04/29/21 114/80  02/08/21 117/78   Wt Readings from Last 3 Encounters:  09/05/21 232 lb (105.2 kg)  04/29/21 238 lb 3.2 oz (108 kg)  02/08/21 240 lb 8 oz (109.1 kg)    Physical Exam Vitals reviewed.  Constitutional:      Appearance: She is well-developed.  Eyes:     Conjunctiva/sclera: Conjunctivae normal.  Cardiovascular:     Rate and Rhythm: Normal rate and regular rhythm.     Pulses: Normal pulses.     Heart sounds: Normal heart sounds.   Pulmonary:     Effort: Pulmonary effort is normal.     Breath sounds: Normal breath sounds. No wheezing, rhonchi or rales.  Skin:    General: Skin is warm and dry.  Neurological:     Mental Status: She is alert.  Psychiatric:        Speech: Speech normal.        Behavior: Behavior normal.        Thought Content: Thought content normal.       Assessment & Plan:   Problem List Items Addressed This Visit       Cardiovascular and Mediastinum   HTN (hypertension)    Well-controlled.  Continue losartan 50mg         Endocrine   Diabetes mellitus without complication (HCC) - Primary    Lab Results  Component Value Date   HGBA1C 7.4 (A) 09/05/2021  Improved.  Continue metformin 2000 mg daily.  Increase Trulicity to 4.5 mg.      Relevant Medications   Dulaglutide (TRULICITY) 4.5 MG/0.5ML SOPN   Other Relevant Orders   POCT HgB A1C (Completed)     I have discontinued Hagadorn's Trulicity and Trulicity. I have also changed her Trulicity. Additionally, I am having her maintain her Qvar, EPINEPHrine, levocetirizine, albuterol, Levonorgestrel-Ethinyl Estradiol, escitalopram, metFORMIN, montelukast, and losartan.   Meds ordered this encounter  Medications   DISCONTD: Dulaglutide (TRULICITY) 4.5 MG/0.5ML SOPN    Sig: Inject 4.5 mg as directed once a week. Start AFTER 4 weeks on trulicity 3mg  qwk    Dispense:  3 mL    Refill:  3    Order Specific Question:   Supervising Provider    Answer:   09/07/2021 L [[id number]]   Dulaglutide (TRULICITY) 4.5 MG/0.5ML SOPN    Sig: Inject 4.5 mg as directed once a week.    Dispense:  3 mL    Refill:  3    Order Specific Question:   Supervising Provider    Answer:   Azucena Freed [[id number]]    Return precautions given.   Risks, benefits, and alternatives of the medications and treatment plan prescribed today were discussed, and patient expressed understanding.   Education regarding symptom management and diagnosis given to patient on  AVS.  Continue to follow with , FNP for routine health maintenance.   Duncan Dull and I agreed with plan.   Sherlene Shams, FNP

## 2021-09-05 NOTE — Assessment & Plan Note (Signed)
Lab Results  Component Value Date   HGBA1C 7.4 (A) 09/05/2021   Improved.  Continue metformin 2000 mg daily.  Increase Trulicity to 4.5 mg.

## 2021-09-05 NOTE — Patient Instructions (Addendum)
Increase Trulicity from 3 mg to 4.5 mg  Nice to see you!

## 2021-09-06 ENCOUNTER — Other Ambulatory Visit: Payer: Self-pay

## 2021-09-06 DIAGNOSIS — E119 Type 2 diabetes mellitus without complications: Secondary | ICD-10-CM

## 2021-09-06 MED ORDER — TRULICITY 4.5 MG/0.5ML ~~LOC~~ SOAJ: 4.5000 mg | SUBCUTANEOUS | 3 refills | Status: DC

## 2021-09-26 ENCOUNTER — Other Ambulatory Visit: Payer: Self-pay | Admitting: Family

## 2021-10-21 ENCOUNTER — Other Ambulatory Visit: Payer: Self-pay

## 2021-10-21 ENCOUNTER — Ambulatory Visit
Admission: EM | Admit: 2021-10-21 | Discharge: 2021-10-21 | Disposition: A | Discharge status: Home / Self Care | Payer: No Typology Code available for payment source | Attending: Emergency Medicine | Admitting: Emergency Medicine

## 2021-10-21 DIAGNOSIS — U071 COVID-19: Secondary | ICD-10-CM | POA: Diagnosis not present

## 2021-10-21 MED ORDER — BENZONATATE 100 MG PO CAPS: 200.0000 mg | ORAL_CAPSULE | Freq: Three times a day (TID) | ORAL | 0 refills | Status: DC

## 2021-10-21 MED ORDER — PROMETHAZINE-DM 6.25-15 MG/5ML PO SYRP: 5.0000 mL | ORAL_SOLUTION | Freq: Four times a day (QID) | ORAL | 0 refills | Status: DC | PRN

## 2021-10-21 MED ORDER — NIRMATRELVIR/RITONAVIR (PAXLOVID)TABLET: 3.0000 | ORAL_TABLET | Freq: Two times a day (BID) | ORAL | 0 refills | Status: AC

## 2021-10-21 MED ORDER — IPRATROPIUM BROMIDE 0.06 % NA SOLN: 2.0000 | Freq: Four times a day (QID) | NASAL | 12 refills | Status: DC

## 2021-10-21 NOTE — Discharge Instructions (Signed)
You will have to quarantine for 5 days from the start of your symptoms.  After 5 days you can break quarantine if your symptoms have improved and you have not had a fever for 24 hours without taking Tylenol or ibuprofen.  Use over-the-counter Tylenol and ibuprofen as needed for body aches and fever.  Use the Atrovent nasal spray, 2 squirts in each nostril every 6 hours, as needed for runny nose and postnasal drip.  Use the Tessalon Perles every 8 hours during the day.  Take them with a small sip of water.  They may give you some numbness to the base of your tongue or a metallic taste in your mouth, this is normal.  Use the Promethazine DM cough syrup at bedtime for cough and congestion.  It will make you drowsy so do not take it during the day.  Continue your daily medication and inhalers.  If you develop any increased shortness of breath-especially at rest, you are unable to speak in full sentences, or is a late sign your lips are turning blue you need to go the ER for evaluation.

## 2021-10-21 NOTE — ED Provider Notes (Signed)
[facility]    CSN: [id number] Arrival date & time: 10/21/21  0806      History   Chief Complaint Chief Complaint  Patient presents with   Covid Positive    HPI Amy Olson is a 25 y.o. female.   HPI  25 year old female here for evaluation of respiratory complaints.  Patient reports that she has been experiencing runny nose, clear nasal discharge, sore throat, body aches, cough and shortness of breath, nausea, and diarrhea for the last 2 days.  She denies fever, ear pain, wheezing, or headache.  She does have a history of asthma and she has been using her inhalers but she does not did note an increase in her inhaler use.  She is on every 3 month birth control but she started her menstrual cycle last evening.  This is not her normal time to have her menstrual cycle.  She denies any chance of pregnancy as she states she is not sexually active.  Past Medical History:  Diagnosis Date   allergies    Allergy    Asthma    Depression    Diabetes mellitus without complication (HCC)    prediabetic   Hypertension     Patient Active Problem List   Diagnosis Date Noted   HTN (hypertension) 01/24/2021   Breakthrough bleeding on OCPs 05/03/2020   Elevated blood pressure reading 05/03/2020   Open wound of finger 01/26/2020   Diabetes mellitus without complication (HCC) 07/04/2016   Depression 12/04/2015   Asthma 09/15/2013    Past Surgical History:  Procedure Laterality Date   ADENOIDECTOMY     1998- only adenoids    OB History     Gravida  0   Para  0   Term  0   Preterm  0   AB  0   Living  0      SAB  0   IAB  0   Ectopic  0   Multiple  0   Live Births  0            Home Medications    Prior to Admission medications   Medication Sig Start Date End Date Taking? Authorizing Provider  albuterol (PROAIR HFA) 108 (90 Base) MCG/ACT inhaler Inhale 2 puffs into the lungs every 6 (six) hours as needed for wheezing or shortness of  breath. 01/26/20  Yes Arnett, Lyn Records, FNP  benzonatate (TESSALON) 100 MG capsule Take 2 capsules (200 mg total) by mouth every 8 (eight) hours. 10/21/21  Yes Becky Augusta, NP  Dulaglutide (TRULICITY) 4.5 MG/0.5ML SOPN Inject 4.5 mg as directed once a week. 09/06/21  Yes Arnett, Lyn Records, FNP  EPINEPHrine 0.3 mg/0.3 mL IJ SOAJ injection epinephrine 0.3 mg/0.3 mL injection, auto-injector  INJECT INTRAMUSCULARLY AS DIRECTED   Yes [provider]  escitalopram (LEXAPRO) 10 MG tablet TAKE 1 TABLET(10 MG) BY MOUTH DAILY 02/24/21  Yes Arnett, Lyn Records, FNP  ipratropium (ATROVENT) 0.06 % nasal spray Place 2 sprays into both nostrils 4 (four) times daily. 10/21/21  Yes Becky Augusta, NP  levocetirizine (XYZAL) 5 MG tablet Take 5 mg by mouth every evening.   Yes [provider]  Levonorgestrel-Ethinyl Estradiol (AMETHIA) 0.15-0.03 &0.01 MG tablet Take 1 tablet by mouth daily. 02/08/21  Yes Linzie Collin, MD  losartan (COZAAR) 50 MG tablet TAKE 1 TABLET(50 MG) BY MOUTH DAILY 09/27/21  Yes Allegra Grana, FNP  metFORMIN (GLUCOPHAGE-XR) 500 MG 24 hr tablet TAKE 4 TABLETS(2000 MG) BY MOUTH  EVERY EVENING 03/22/21  Yes Allegra Grana, FNP  montelukast (SINGULAIR) 10 MG tablet TAKE 1 TABLET(10 MG) BY MOUTH AT BEDTIME 05/09/21  Yes Allegra Grana, FNP  nirmatrelvir/ritonavir EUA (PAXLOVID) 20 x 150 MG & 10 x 100MG  TABS Take 3 tablets by mouth 2 (two) times daily for 5 days. Patient GFR is 101. Take nirmatrelvir (150 mg) two tablets twice daily for 5 days and ritonavir (100 mg) one tablet twice daily for 5 days. 10/21/21 10/26/21 Yes 12/24/21, NP  promethazine-dextromethorphan (PROMETHAZINE-DM) 6.25-15 MG/5ML syrup Take 5 mLs by mouth 4 (four) times daily as needed. 10/21/21  Yes 10/23/21, NP  QVAR 80 MCG/ACT inhaler TAKE 2 PUFFS FIRST THING IN THE MORNING AND THEN  ANOTHER 2 PUFFS ABOUT 12  HOURS LATER. 11/13/16  Yes 11/15/16, MD    Family History Family History   Problem Relation Age of Onset   Rheum arthritis Maternal Grandmother    Hyperlipidemia Maternal Grandmother    Hypertension Maternal Grandmother    Arthritis Maternal Grandmother    Memory loss Maternal Grandmother    Hyperlipidemia Mother    Hypertension Father    Diabetes Brother    Hyperlipidemia Maternal Grandfather    Hypertension Maternal Grandfather    Cystic kidney disease Maternal Grandfather        polycystic kidney disorder    Colon cancer Paternal Grandmother 76   Hypertension Paternal Grandfather    Dementia Paternal Great-grandmother    Cystic kidney disease Maternal Aunt    Breast cancer Neg Hx    Thyroid cancer Neg Hx     Social History Social History   Tobacco Use   Smoking status: Never   Smokeless tobacco: Never  Vaping Use   Vaping Use: Never used  Substance Use Topics   Alcohol use: Yes    Alcohol/week: 1.0 - 2.0 standard drink    Types: 1 - 2 Standard drinks or equivalent per week    Comment: rare   Drug use: No     Allergies   Augmentin [amoxicillin-pot clavulanate]   Review of Systems Review of Systems  Constitutional:  Negative for activity change, appetite change and fever.  HENT:  Positive for congestion, rhinorrhea and sore throat. Negative for ear pain.   Respiratory:  Positive for cough and shortness of breath. Negative for wheezing.   Gastrointestinal:  Positive for diarrhea and nausea. Negative for vomiting.  Musculoskeletal:  Positive for arthralgias and myalgias.  Skin:  Negative for rash.  Neurological:  Negative for headaches.  Hematological: Negative.   Psychiatric/Behavioral: Negative.      Physical Exam Triage Vital Signs ED Triage Vitals  Enc Vitals Group     BP 10/21/21 0825 (!) 142/96     Pulse Rate 10/21/21 0825 100     Resp 10/21/21 0825 16     Temp 10/21/21 0825 98.9 F (37.2 C)     Temp Source 10/21/21 0825 Oral     SpO2 10/21/21 0825 98 %     Weight 10/21/21 0823 240 lb (108.9 kg)     Height 10/21/21  0823 5\' 6"  (1.676 m)     Head Circumference --      Peak Flow --      Pain Score 10/21/21 0821 6     Pain Loc --      Pain Edu? --      Excl. in GC? --    No data found.  Updated Vital Signs BP (!) 142/96 (BP Location: Right  Arm)    Pulse 100    Temp 98.9 F (37.2 C) (Oral)    Resp 16    Ht 5\' 6"  (1.676 m)    Wt 240 lb (108.9 kg)    LMP 10/21/2021    SpO2 98%    BMI 38.74 kg/m   Visual Acuity Right Eye Distance:   Left Eye Distance:   Bilateral Distance:    Right Eye Near:   Left Eye Near:    Bilateral Near:     Physical Exam Vitals and nursing note reviewed.  Constitutional:      General: She is not in acute distress.    Appearance: Normal appearance. She is obese. She is not ill-appearing.  HENT:     Head: Normocephalic and atraumatic.     Right Ear: Tympanic membrane, ear canal and external ear normal. There is no impacted cerumen.     Left Ear: Tympanic membrane, ear canal and external ear normal. There is no impacted cerumen.     Nose: Congestion and rhinorrhea present.     Mouth/Throat:     Mouth: Mucous membranes are moist.     Pharynx: Oropharynx is clear. Posterior oropharyngeal erythema present.  Cardiovascular:     Rate and Rhythm: Normal rate and regular rhythm.     Pulses: Normal pulses.     Heart sounds: Normal heart sounds. No murmur heard.   No friction rub. No gallop.  Pulmonary:     Effort: Pulmonary effort is normal.     Breath sounds: Normal breath sounds. No wheezing, rhonchi or rales.  Musculoskeletal:     Cervical back: Normal range of motion and neck supple.  Lymphadenopathy:     Cervical: No cervical adenopathy.  Skin:    General: Skin is warm and dry.     Capillary Refill: Capillary refill takes less than 2 seconds.     Findings: No erythema or rash.  Neurological:     General: No focal deficit present.     Mental Status: She is alert and oriented to person, place, and time.  Psychiatric:        Mood and Affect: Mood normal.         Behavior: Behavior normal.        Thought Content: Thought content normal.        Judgment: Judgment normal.     UC Treatments / Results  Labs (all labs ordered are listed, but only abnormal results are displayed) Labs Reviewed - No data to display  EKG   Radiology No results found.  Procedures Procedures (including critical care time)  Medications Ordered in UC Medications - No data to display  Initial Impression / Assessment and Plan / UC Course  I have reviewed the triage vital signs and the nursing notes.  Pertinent labs & imaging results that were available during my care of the patient were reviewed by me and considered in my medical decision making (see chart for details).  Patient is a very pleasant, nontoxic-appearing 25 year old female with a significant history of diabetes, hypertension, asthma, and obesity who presents for evaluation of respiratory symptoms.  Her symptoms, as outlined HPI above, have been going on for last 2 days.  She did test positive for COVID at home this morning so she came in for evaluation.  On exam patient has pearly gray tympanic membranes bilaterally with normal light reflex and clear external auditory canals.  Nasal mucosa is erythematous and edematous with scant clear rhinorrhea present.  Oropharyngeal exam  reveals mild posterior oropharyngeal erythema with clear postnasal drip.  No cervical lymphadenopathy appreciated on exam.  Cardiopulmonary exam reveals clear lung sounds in all fields.  Patient has recent blood work from 05/31/2021 showing a GFR of 101 so I will start her on Paxlovid for the treatment of her COVID-19.  I will also provide a prescription for Atrovent nasal spray, Tessalon Perles, and Promethazine DM cough syrup to help with cough symptoms.  Patient vies that if her respiratory symptoms worsen that she is to be evaluated in the emergency department.  Work note provided.   Final Clinical Impressions(s) / UC Diagnoses   Final  diagnoses:  COVID-19     Discharge Instructions      You will have to quarantine for 5 days from the start of your symptoms.  After 5 days you can break quarantine if your symptoms have improved and you have not had a fever for 24 hours without taking Tylenol or ibuprofen.  Use over-the-counter Tylenol and ibuprofen as needed for body aches and fever.  Use the Atrovent nasal spray, 2 squirts in each nostril every 6 hours, as needed for runny nose and postnasal drip.  Use the Tessalon Perles every 8 hours during the day.  Take them with a small sip of water.  They may give you some numbness to the base of your tongue or a metallic taste in your mouth, this is normal.  Use the Promethazine DM cough syrup at bedtime for cough and congestion.  It will make you drowsy so do not take it during the day.  Continue your daily medication and inhalers.  If you develop any increased shortness of breath-especially at rest, you are unable to speak in full sentences, or is a late sign your lips are turning blue you need to go the ER for evaluation.      ED Prescriptions     Medication Sig Dispense Auth. Provider   nirmatrelvir/ritonavir EUA (PAXLOVID) 20 x 150 MG & 10 x  TABS Take 3 tablets by mouth 2 (two) times daily for 5 days. Patient GFR is 101. Take nirmatrelvir (150 mg) two tablets twice daily for 5 days and ritonavir (100 mg) one tablet twice daily for 5 days. 30 tablet Becky Augusta, NP   ipratropium (ATROVENT) 0.06 % nasal spray Place 2 sprays into both nostrils 4 (four) times daily. 15 mL Becky Augusta, NP   benzonatate (TESSALON) 100 MG capsule Take 2 capsules (200 mg total) by mouth every 8 (eight) hours. 21 capsule Becky Augusta, NP   promethazine-dextromethorphan (PROMETHAZINE-DM) 6.25-15 MG/5ML syrup Take 5 mLs by mouth 4 (four) times daily as needed. 118 mL Becky Augusta, NP      PDMP not reviewed this encounter.   Becky Augusta, NP 10/21/21 [phone]

## 2021-10-21 NOTE — ED Triage Notes (Signed)
Pt here with C/O sore throat, body aches, runny nose, nausea, tested positive for Covid this morning.

## 2021-10-31 ENCOUNTER — Other Ambulatory Visit: Payer: Self-pay | Admitting: Family

## 2021-11-01 ENCOUNTER — Encounter: Payer: Self-pay | Admitting: Family

## 2021-11-02 ENCOUNTER — Other Ambulatory Visit: Payer: Self-pay | Admitting: Family

## 2021-11-02 DIAGNOSIS — E119 Type 2 diabetes mellitus without complications: Secondary | ICD-10-CM

## 2021-11-02 MED ORDER — TRULICITY 3 MG/0.5ML ~~LOC~~ SOAJ: 3.0000 mg | SUBCUTANEOUS | 1 refills | Status: DC

## 2021-11-09 ENCOUNTER — Encounter: Payer: Self-pay | Admitting: Family

## 2021-11-09 ENCOUNTER — Telehealth: Payer: Self-pay

## 2021-11-09 NOTE — Telephone Encounter (Signed)
LMTCB to get new insurance info for PA for her Trulicity 3MG /0.5ML pen-injectors.

## 2021-11-09 NOTE — Telephone Encounter (Signed)
Pt returning call

## 2021-11-09 NOTE — Telephone Encounter (Signed)
I spoke with patient & she will be sending pic of new insurance card through [facility].

## 2021-11-15 ENCOUNTER — Other Ambulatory Visit: Payer: Self-pay

## 2021-11-15 ENCOUNTER — Telehealth: Payer: Self-pay | Admitting: Family

## 2021-11-15 DIAGNOSIS — E119 Type 2 diabetes mellitus without complications: Secondary | ICD-10-CM

## 2021-11-15 NOTE — Telephone Encounter (Signed)
noted 

## 2021-11-15 NOTE — Telephone Encounter (Signed)
Maralyn Sago - what was the denial reason for the PA?

## 2021-11-15 NOTE — Telephone Encounter (Signed)
Catie Can you help [place] with trulicity?  Insurance requesting PA which Maralyn Sago has attempted many times to no avail.   Should we change to another GLP1?  Sarah, please let pt know we are working on this.

## 2021-11-15 NOTE — Telephone Encounter (Signed)
I called to let patient know about referral. She stated that she went to [facility] & that they were running the wrong insurance. Their supplier however did not have the Trulicity 3 mg in stock. They advised that she call [facility] or [facility] to see if they have in stock. She will let us know. However she does not know yet is she needs  a PA.

## 2021-11-15 NOTE — Telephone Encounter (Signed)
See phone note

## 2021-11-16 ENCOUNTER — Other Ambulatory Visit: Payer: Self-pay | Admitting: Family

## 2021-11-16 DIAGNOSIS — E119 Type 2 diabetes mellitus without complications: Secondary | ICD-10-CM

## 2021-11-16 MED ORDER — OZEMPIC (0.25 OR 0.5 MG/DOSE) 2 MG/1.5ML ~~LOC~~ SOPN: PEN_INJECTOR | SUBCUTANEOUS | 3 refills | Status: DC

## 2021-11-16 NOTE — Telephone Encounter (Signed)
I spoke with patient & let her know that Ozempic was sent in. I advised on how to titrate as well. Pt will call us if any issues.

## 2021-12-01 ENCOUNTER — Telehealth: Payer: Self-pay

## 2021-12-01 NOTE — Telephone Encounter (Signed)
I called patient but VM was full. I keep receiving PA for Ozempic & when I complete I get kick back from [facility] that they do not handle patient's PA's. I wanted to see if she has been able to pick up medication.

## 2021-12-02 ENCOUNTER — Encounter: Payer: Self-pay | Admitting: Family

## 2021-12-07 ENCOUNTER — Other Ambulatory Visit: Payer: Self-pay | Admitting: Family

## 2021-12-07 ENCOUNTER — Ambulatory Visit: Payer: No Typology Code available for payment source | Admitting: Family

## 2021-12-07 DIAGNOSIS — R413 Other amnesia: Secondary | ICD-10-CM

## 2021-12-07 DIAGNOSIS — F418 Other specified anxiety disorders: Secondary | ICD-10-CM

## 2022-01-02 ENCOUNTER — Other Ambulatory Visit: Payer: Self-pay | Admitting: Family

## 2022-01-04 ENCOUNTER — Ambulatory Visit: Payer: No Typology Code available for payment source | Admitting: Family

## 2022-01-09 ENCOUNTER — Ambulatory Visit: Payer: No Typology Code available for payment source | Admitting: Family

## 2022-01-16 ENCOUNTER — Other Ambulatory Visit: Payer: Self-pay

## 2022-01-16 ENCOUNTER — Encounter: Payer: Self-pay | Admitting: Family

## 2022-01-16 ENCOUNTER — Ambulatory Visit: Payer: No Typology Code available for payment source | Admitting: Family

## 2022-01-16 VITALS — BP 128/78 | HR 97 | Temp 98.2°F | Ht 66.0 in | Wt 235.8 lb

## 2022-01-16 DIAGNOSIS — E119 Type 2 diabetes mellitus without complications: Secondary | ICD-10-CM | POA: Diagnosis not present

## 2022-01-16 DIAGNOSIS — I10 Essential (primary) hypertension: Secondary | ICD-10-CM

## 2022-01-16 LAB — POCT GLYCOSYLATED HEMOGLOBIN (HGB A1C): Hemoglobin A1C: 7.3 % — AB (ref 4.0–5.6)

## 2022-01-16 MED ORDER — METFORMIN HCL ER 500 MG PO TB24: 1000.0000 mg | ORAL_TABLET | Freq: Every evening | ORAL | 1 refills | Status: DC

## 2022-01-16 MED ORDER — OZEMPIC (1 MG/DOSE) 4 MG/3ML ~~LOC~~ SOPN: 1.0000 mg | PEN_INJECTOR | SUBCUTANEOUS | 2 refills | Status: DC

## 2022-01-16 NOTE — Assessment & Plan Note (Signed)
Lab Results  ?Component Value Date  ? HGBA1C 7.4 (A) 09/05/2021  ? ?Significant improvement.  Increase Ozempic to 1 mg.  Decrease metformin from 2000 mg daily to 1000 mg daily to see if she notices any more tablets of metformin in her stool. Expect stool to be more formed as well.   She will let me know if this persists we can proceed with further evaluation.  Encouraged her to spot check glucose. ?

## 2022-01-16 NOTE — Progress Notes (Signed)
? ?Subjective:  ? ? [name], female    DOB: 1996/02/01, 26 y.o.   MRN: [id number] ? ?CC: Amy Olson is a 26 y.o. female who presents today for follow up.  ? ?HPI: She feels well today.  No new complaints ? ?HTN-compliant with losartan 50 mg ?DM-She hasnt been checking blood sugar. compliant with Ozempic 0.5 mg which she done 4 weeks. Compliant metformin 2000mg  qpm.  She reports loose stools on metformin and she on occasion will see 8 metformin  in her stool ? ?Denies nausea, constipation.  ? ?HISTORY:  ?Past Medical History:  ?Diagnosis Date  ? allergies   ? Allergy   ? Asthma   ? Depression   ? Diabetes mellitus without complication (HCC)   ? prediabetic  ? Hypertension   ? ?Past Surgical History:  ?Procedure Laterality Date  ? ADENOIDECTOMY    ? 1998- only adenoids  ? ?Family History  ?Problem Relation Age of Onset  ? Rheum arthritis Maternal Grandmother   ? Hyperlipidemia Maternal Grandmother   ? Hypertension Maternal Grandmother   ? Arthritis Maternal Grandmother   ? Memory loss Maternal Grandmother   ? Hyperlipidemia Mother   ? Hypertension Father   ? Diabetes Brother   ? Hyperlipidemia Maternal Grandfather   ? Hypertension Maternal Grandfather   ? Cystic kidney disease Maternal Grandfather   ?     polycystic kidney disorder   ? Colon cancer Paternal Grandmother [id number]  ? Hypertension Paternal Grandfather   ? Dementia Paternal Great-grandmother   ? Cystic kidney disease Maternal Aunt   ? Breast cancer Neg Hx   ? Thyroid cancer Neg Hx   ? ? ?Allergies: Augmentin [amoxicillin-pot clavulanate] ?Current Outpatient Medications on File Prior to Visit  ?Medication Sig Dispense Refill  ? albuterol (PROAIR HFA) 108 (90 Base) MCG/ACT inhaler Inhale 2 puffs into the lungs every 6 (six) hours as needed for wheezing or shortness of breath. 18 g 1  ? benzonatate (TESSALON) 100 MG capsule Take 2 capsules (200 mg total) by mouth every 8 (eight) hours. 21 capsule 0  ? EPINEPHrine 0.3 mg/0.3 mL IJ SOAJ injection  epinephrine 0.3 mg/0.3 mL injection, auto-injector ? INJECT INTRAMUSCULARLY AS DIRECTED    ? escitalopram (LEXAPRO) 10 MG tablet TAKE 1 TABLET(10 MG) BY MOUTH DAILY 30 tablet 1  ? ipratropium (ATROVENT) 0.06 % nasal spray Place 2 sprays into both nostrils 4 (four) times daily. 15 mL 12  ? levocetirizine (XYZAL) 5 MG tablet Take 5 mg by mouth every evening.    ? Levonorgestrel-Ethinyl Estradiol (AMETHIA) 0.15-0.03 &0.01 MG tablet Take 1 tablet by mouth daily. 84 tablet 3  ? losartan (COZAAR) 50 MG tablet TAKE 1 TABLET(50 MG) BY MOUTH DAILY 90 tablet 0  ? montelukast (SINGULAIR) 10 MG tablet TAKE 1 TABLET(10 MG) BY MOUTH AT BEDTIME 90 tablet 3  ? promethazine-dextromethorphan (PROMETHAZINE-DM) 6.25-15 MG/5ML syrup Take 5 mLs by mouth 4 (four) times daily as needed. 118 mL 0  ? QVAR 80 MCG/ACT inhaler TAKE 2 PUFFS FIRST THING IN THE MORNING AND THEN  ANOTHER 2 PUFFS ABOUT 12  HOURS LATER. 3 Inhaler 0  ? ?No current facility-administered medications on file prior to visit.  ? ? ?Social History  ? ?Tobacco Use  ? Smoking status: Never  ? Smokeless tobacco: Never  ?Vaping Use  ? Vaping Use: Never used  ?Substance Use Topics  ? Alcohol use: Yes  ?  Alcohol/week: 1.0 - 2.0 standard drink  ?  Types: 1 -  2 Standard drinks or equivalent per week  ?  Comment: rare  ? Drug use: No  ? ? ?Review of Systems  ?Constitutional:  Negative for chills and fever.  ?Respiratory:  Negative for cough.   ?Cardiovascular:  Negative for chest pain and palpitations.  ?Gastrointestinal:  Negative for nausea and vomiting.  ?   ?Objective:  ?  ?BP 128/78 (BP Location: Left Arm, Patient Position: Sitting, Cuff Size: Normal)   Pulse 97   Temp 98.2 ?F (36.8 ?C) (Oral)   Ht 5\' 6"  (1.676 m)   Wt 235 lb 12.8 oz (107 kg)   LMP 12/27/2021 (Approximate)   SpO2 97%   BMI 38.06 kg/m?  ?BP Readings from Last 3 Encounters:  ?01/16/22 128/78  ?10/21/21 (!) 142/96  ?09/05/21 118/82  ? ?Wt Readings from Last 3 Encounters:  ?01/16/22 235 lb 12.8 oz (107 kg)   ?10/21/21 240 lb (108.9 kg)  ?09/05/21 232 lb (105.2 kg)  ? ? ?Physical Exam ?Vitals reviewed.  ?Constitutional:   ?   Appearance: She is well-developed.  ?Eyes:  ?   Conjunctiva/sclera: Conjunctivae normal.  ?Cardiovascular:  ?   Rate and Rhythm: Normal rate and regular rhythm.  ?   Pulses: Normal pulses.  ?   Heart sounds: Normal heart sounds.  ?Pulmonary:  ?   Effort: Pulmonary effort is normal.  ?   Breath sounds: Normal breath sounds. No wheezing, rhonchi or rales.  ?Skin: ?   General: Skin is warm and dry.  ?Neurological:  ?   Mental Status: She is alert.  ?Psychiatric:     ?   Speech: Speech normal.     ?   Behavior: Behavior normal.     ?   Thought Content: Thought content normal.  ? ? ?   ?Assessment & Plan:  ? ?Problem List Items Addressed This Visit   ? ?  ? Cardiovascular and Mediastinum  ? HTN (hypertension)  ?  Chronic, stable.  Continue losartan 50mg  ?  ?  ?  ? Endocrine  ? Diabetes mellitus without complication (HCC) - Primary  ?  Lab Results  ?Component Value Date  ? HGBA1C 7.4 (A) 09/05/2021  ?Significant improvement.  Increase Ozempic to 1 mg.  Decrease metformin from 2000 mg daily to 1000 mg daily to see if she notices any more tablets of metformin in her stool. Expect stool to be more formed as well.   She will let me know if this persists we can proceed with further evaluation.  Encouraged her to spot check glucose. ?  ?  ? Relevant Medications  ? Semaglutide, 1 MG/DOSE, (OZEMPIC, 1 MG/DOSE,) 4 MG/3ML SOPN  ? metFORMIN (GLUCOPHAGE-XR) 500 MG 24 hr tablet  ? Other Relevant Orders  ? POCT HgB A1C  ? Comprehensive metabolic panel  ? Lipid panel  ? Microalbumin / creatinine urine ratio  ? ? ? ?I have discontinued Folmar's Ozempic (0.25 or 0.5 MG/DOSE). I have also changed her metFORMIN. Additionally, I am having her start on Ozempic (1 MG/DOSE). Lastly, I am having her maintain her Qvar, EPINEPHrine, levocetirizine, albuterol, Levonorgestrel-Ethinyl Estradiol, montelukast, ipratropium,  benzonatate, promethazine-dextromethorphan, escitalopram, and losartan. ? ? ?Meds ordered this encounter  ?Medications  ? Semaglutide, 1 MG/DOSE, (OZEMPIC, 1 MG/DOSE,) 4 MG/3ML SOPN  ?  Sig: Inject 1 mg into the skin once a week.  ?  Dispense:  6 mL  ?  Refill:  2  ?  Order Specific Question:   Supervising Provider  ?  Answer:  TULLO, TERESA L [[id number]]  ? metFORMIN (GLUCOPHAGE-XR) 500 MG 24 hr tablet  ?  Sig: Take 2 tablets (1,000 mg total) by mouth every evening.  ?  Dispense:  360 tablet  ?  Refill:  1  ?  Order Specific Question:   Supervising Provider  ?  Answer:   Sherlene Shams [[id number]]  ? ? ?Return precautions given.  ? ?Risks, benefits, and alternatives of the medications and treatment plan prescribed today were discussed, and patient expressed understanding.  ? ?Education regarding symptom management and diagnosis given to patient on AVS. ? ?Continue to follow with Allegra Grana, FNP for routine health maintenance.  ? ?Amy Olson and I agreed with plan.  ? ?Rennie Plowman, FNP ? ? ?

## 2022-01-16 NOTE — Patient Instructions (Signed)
Increase Ozempic to 1 mg. ? ?Decrease metformin to 2 tablets (total of 1000 mg) taken at bedtime.  Let me know if you continue to see suspected metformin pill in your stool ? ? ?Goal of fasting blood sugar is between 70-120. If in this range, we are reaching our target a1c ( goal 6.5%)  ? ?Please check fasting blood sugar in the morning time once or twice a week.  You may also check if you feel like you are having a low episode or particularly high episode of blood sugar. ? ?If blood sugars increase, I may advise you to check blood sugar after your largest meal.  You specifically do this TWO hours after largest meal with the goal of being less than 180.  If blood sugar is checked sooner than 2 hours after largest meal, and it will be  expected to be elevated. You must wait 2 hours.  ? ?If your blood sugar is less than 180 hours after your largest meal, again we are reaching our target a1c goal  ? ?Call [facility] clinic if: BG < 70 or > 300. ? ? If you have any symptoms of low blood sugar ( sweating, shakiness, lightheaded, dizzy) that you notify me. If you have a low, please drink a glass of orange juice and recheck blood sugar every 5 minutes until you don?t feel symptomatic AND blood sugar is above 80.  ? ?

## 2022-01-16 NOTE — Assessment & Plan Note (Signed)
Chronic, stable.  Continue losartan 50 mg 

## 2022-02-02 ENCOUNTER — Other Ambulatory Visit (INDEPENDENT_AMBULATORY_CARE_PROVIDER_SITE_OTHER): Payer: No Typology Code available for payment source

## 2022-02-02 DIAGNOSIS — E119 Type 2 diabetes mellitus without complications: Secondary | ICD-10-CM | POA: Diagnosis not present

## 2022-02-02 LAB — COMPREHENSIVE METABOLIC PANEL
ALT: 23 U/L (ref 0–35)
AST: 27 U/L (ref 0–37)
Albumin: 3.9 g/dL (ref 3.5–5.2)
Alkaline Phosphatase: 57 U/L (ref 39–117)
BUN: 11 mg/dL (ref 6–23)
CO2: 21 mEq/L (ref 19–32)
Calcium: 8.9 mg/dL (ref 8.4–10.5)
Chloride: 102 mEq/L (ref 96–112)
Creatinine, Ser: 0.74 mg/dL (ref 0.40–1.20)
GFR: 110.76 mL/min (ref >60.00)
Glucose, Bld: 168 mg/dL — ABNORMAL HIGH (ref 70–99)
Potassium: 4 mEq/L (ref 3.5–5.1)
Sodium: 134 mEq/L — ABNORMAL LOW (ref 135–145)
Total Bilirubin: 0.3 mg/dL (ref 0.2–1.2)
Total Protein: 6.8 g/dL (ref 6.0–8.3)

## 2022-02-02 LAB — LIPID PANEL
Cholesterol: 161 mg/dL (ref 0–200)
HDL: 37.5 mg/dL — ABNORMAL LOW (ref >39.00)
NonHDL: 123.99
Total CHOL/HDL Ratio: 4
Triglycerides: 344 mg/dL — ABNORMAL HIGH (ref 0.0–149.0)
VLDL: 68.8 mg/dL — ABNORMAL HIGH (ref 0.0–40.0)

## 2022-02-02 LAB — MICROALBUMIN / CREATININE URINE RATIO
Creatinine,U: 191.8 mg/dL
Microalb Creat Ratio: 1.1 mg/g (ref 0.0–30.0)
Microalb, Ur: 2 mg/dL — ABNORMAL HIGH (ref 0.0–1.9)

## 2022-02-02 LAB — LDL CHOLESTEROL, DIRECT: Direct LDL: 86 mg/dL

## 2022-02-10 ENCOUNTER — Encounter: Payer: PRIVATE HEALTH INSURANCE | Admitting: Obstetrics and Gynecology

## 2022-02-14 ENCOUNTER — Other Ambulatory Visit: Payer: Self-pay | Admitting: Family

## 2022-02-14 ENCOUNTER — Encounter: Payer: Self-pay | Admitting: Family

## 2022-02-14 ENCOUNTER — Other Ambulatory Visit: Payer: Self-pay

## 2022-02-14 DIAGNOSIS — I10 Essential (primary) hypertension: Secondary | ICD-10-CM

## 2022-02-14 MED ORDER — LOSARTAN POTASSIUM 25 MG PO TABS: ORAL_TABLET | ORAL | 3 refills | Status: DC

## 2022-02-23 ENCOUNTER — Other Ambulatory Visit: Payer: Self-pay | Admitting: Family

## 2022-02-23 ENCOUNTER — Other Ambulatory Visit: Payer: Self-pay | Admitting: Obstetrics and Gynecology

## 2022-02-23 ENCOUNTER — Other Ambulatory Visit: Payer: No Typology Code available for payment source

## 2022-02-23 DIAGNOSIS — R413 Other amnesia: Secondary | ICD-10-CM

## 2022-02-23 DIAGNOSIS — Z01419 Encounter for gynecological examination (general) (routine) without abnormal findings: Secondary | ICD-10-CM

## 2022-02-23 DIAGNOSIS — F418 Other specified anxiety disorders: Secondary | ICD-10-CM

## 2022-02-27 NOTE — Telephone Encounter (Signed)
RX refilled once until pt is seen for annual. ?

## 2022-03-14 ENCOUNTER — Encounter: Payer: No Typology Code available for payment source | Admitting: Obstetrics and Gynecology

## 2022-04-19 ENCOUNTER — Ambulatory Visit: Payer: No Typology Code available for payment source | Admitting: Family

## 2022-04-23 ENCOUNTER — Other Ambulatory Visit: Payer: Self-pay | Admitting: Family

## 2022-04-28 ENCOUNTER — Encounter: Payer: Self-pay | Admitting: Family

## 2022-05-01 ENCOUNTER — Other Ambulatory Visit: Payer: Self-pay

## 2022-05-01 DIAGNOSIS — E119 Type 2 diabetes mellitus without complications: Secondary | ICD-10-CM

## 2022-05-01 MED ORDER — LOSARTAN POTASSIUM 50 MG PO TABS: ORAL_TABLET | ORAL | 0 refills | Status: DC

## 2022-05-03 ENCOUNTER — Encounter: Payer: Self-pay | Admitting: Family

## 2022-05-03 ENCOUNTER — Ambulatory Visit: Payer: No Typology Code available for payment source | Admitting: Family

## 2022-05-03 VITALS — BP 122/80 | HR 96 | Temp 98.1°F | Ht 66.0 in | Wt 238.4 lb

## 2022-05-03 DIAGNOSIS — E119 Type 2 diabetes mellitus without complications: Secondary | ICD-10-CM | POA: Diagnosis not present

## 2022-05-03 LAB — POCT GLYCOSYLATED HEMOGLOBIN (HGB A1C): Hemoglobin A1C: 7.4 % — AB (ref 4.0–5.6)

## 2022-05-03 MED ORDER — SEMAGLUTIDE (2 MG/DOSE) 8 MG/3ML ~~LOC~~ SOPN: 2.0000 mg | PEN_INJECTOR | SUBCUTANEOUS | 2 refills | Status: DC

## 2022-05-03 MED ORDER — LOSARTAN POTASSIUM 25 MG PO TABS: 12.5000 mg | ORAL_TABLET | Freq: Every day | ORAL | 1 refills | Status: DC

## 2022-05-03 NOTE — Assessment & Plan Note (Signed)
Lab Results  Component Value Date   HGBA1C 7.4 (A) 05/03/2022   Uncontrolled. Increase ozempic to 2mg . Continue metformin 1000mg  qd, losartan 62.5mg  for renal protection.

## 2022-05-03 NOTE — Progress Notes (Signed)
Subjective:    [name], female    DOB: 04-07-1996, 26 y.o.   MRN: [id number]  CC: [name] is a 26 y.o. female who presents today for follow up.   HPI: Feels well today.  No new complaints  DM- compliant with ozempic 1mg , metformin 1000mg  qd.  Following medication.  She has not seen metformin pill in her stool again.   Proteinuria- compliant with losartan 62.5mg    She is compliant with OCP.   HISTORY:  Past Medical History:  Diagnosis Date  . allergies   . Allergy   . Asthma   . Depression   . Diabetes mellitus without complication (HCC)    prediabetic  . Hypertension    Past Surgical History:  Procedure Laterality Date  . ADENOIDECTOMY     1998- only adenoids   Family History  Problem Relation Age of Onset  . Rheum arthritis Maternal Grandmother   . Hyperlipidemia Maternal Grandmother   . Hypertension Maternal Grandmother   . Arthritis Maternal Grandmother   . Memory loss Maternal Grandmother   . Hyperlipidemia Mother   . Hypertension Father   . Diabetes Brother   . Hyperlipidemia Maternal Grandfather   . Hypertension Maternal Grandfather   . Cystic kidney disease Maternal Grandfather        polycystic kidney disorder   . Colon cancer Paternal Grandmother 66  . Hypertension Paternal Grandfather   . Dementia Paternal Great-grandmother   . Cystic kidney disease Maternal Aunt   . Breast cancer Neg Hx   . Thyroid cancer Neg Hx     Allergies: Augmentin [amoxicillin-pot clavulanate] Current Outpatient Medications on File Prior to Visit  Medication Sig Dispense Refill  . albuterol (PROAIR HFA) 108 (90 Base) MCG/ACT inhaler Inhale 2 puffs into the lungs every 6 (six) hours as needed for wheezing or shortness of breath. 18 g 1  . EPINEPHrine 0.3 mg/0.3 mL IJ SOAJ injection epinephrine 0.3 mg/0.3 mL injection, auto-injector  INJECT INTRAMUSCULARLY AS DIRECTED    . escitalopram (LEXAPRO) 10 MG tablet TAKE 1 TABLET(10 MG) BY MOUTH DAILY 90 tablet  3  . levocetirizine (XYZAL) 5 MG tablet Take 5 mg by mouth every evening.    losartan (COZAAR) 25 MG tablet TAKE ONE HALF TABLET (12.5 MG) DAILY WITH 50 MG DOSE FOR TOTAL OF 62.5 MG DAILY. 30 tablet 3  . losartan (COZAAR) 50 MG tablet TAKE 1 TABLET(50 MG) BY MOUTH DAILY 90 tablet 0  . metFORMIN (GLUCOPHAGE-XR) 500 MG 24 hr tablet Take 2 tablets (1,000 mg total) by mouth every evening. 360 tablet 1  . montelukast (SINGULAIR) 10 MG tablet TAKE 1 TABLET(10 MG) BY MOUTH AT BEDTIME 90 tablet 3  . QVAR 80 MCG/ACT inhaler TAKE 2 PUFFS FIRST THING IN THE MORNING AND THEN  ANOTHER 2 PUFFS ABOUT 12  HOURS LATER. 3 Inhaler 0  . Semaglutide, 1 MG/DOSE, (OZEMPIC, 1 MG/DOSE,) 4 MG/3ML SOPN Inject 1 mg into the skin once a week. 6 mL 2  . SIMPESSE 0.15-0.03 &0.01 MG tablet TAKE 1 TABLET BY MOUTH EVERY DAY 91 tablet 0   No current facility-administered medications on file prior to visit.    Social History   Tobacco Use  . Smoking status: Never  . Smokeless tobacco: Never  Vaping Use  . Vaping Use: Never used  Substance Use Topics  . Alcohol use: Yes    Alcohol/week: 1.0 - 2.0 standard drink of alcohol    Types: 1 - 2 Standard drinks  or equivalent per week    Comment: rare  . Drug use: No    Review of Systems  Constitutional:  Negative for chills and fever.  Respiratory:  Negative for cough.   Cardiovascular:  Negative for chest pain and palpitations.  Gastrointestinal:  Negative for nausea and vomiting.      Objective:    BP 122/80 (BP Location: Left Arm, Patient Position: Sitting, Cuff Size: Normal)   Pulse 96   Temp 98.1 F (36.7 C) (Oral)   Ht 5\' 6"  (1.676 m)   Wt 238 lb 6.4 oz (108.1 kg)   LMP 04/04/2022 (Exact Date)   SpO2 95%   BMI 38.48 kg/m  BP Readings from Last 3 Encounters:  05/03/22 122/80  01/16/22 128/78  10/21/21 (!) 142/96   Wt Readings from Last 3 Encounters:  05/03/22 238 lb 6.4 oz (108.1 kg)  01/16/22 235 lb 12.8 oz (107 kg)  10/21/21 240 lb (108.9 kg)     Physical Exam Vitals reviewed.  Constitutional:      Appearance: She is well-developed.  Eyes:     Conjunctiva/sclera: Conjunctivae normal.  Cardiovascular:     Rate and Rhythm: Normal rate and regular rhythm.     Pulses: Normal pulses.     Heart sounds: Normal heart sounds.  Pulmonary:     Effort: Pulmonary effort is normal.     Breath sounds: Normal breath sounds. No wheezing, rhonchi or rales.  Skin:    General: Skin is warm and dry.  Neurological:     Mental Status: She is alert.  Psychiatric:        Speech: Speech normal.        Behavior: Behavior normal.        Thought Content: Thought content normal.       Assessment & Plan:   Problem List Items Addressed This Visit       Endocrine   Diabetes mellitus without complication (HCC) - Primary    Lab Results  Component Value Date   HGBA1C 7.4 (A) 05/03/2022  Uncontrolled. Increase ozempic to 2mg . Continue metformin 1000mg  qd, losartan 62.5mg  for renal protection.       Relevant Medications   losartan (COZAAR) 25 MG tablet   Other Relevant Orders   POCT HgB A1C (Completed)   Microalbumin / creatinine urine ratio     I have discontinued Tahtiana E. Neiswonger's ipratropium, benzonatate, and promethazine-dextromethorphan. I am also having her start on losartan. Additionally, I am having her maintain her Qvar, EPINEPHrine, levocetirizine, albuterol, montelukast, Ozempic (1 MG/DOSE), metFORMIN, losartan, escitalopram, [place], and losartan.   Meds ordered this encounter  Medications  . losartan (COZAAR) 25 MG tablet    Sig: Take 0.5 tablets (12.5 mg total) by mouth daily.    Dispense:  90 tablet    Refill:  1    Order Specific Question:   Supervising Provider    Answer:   [[id number]]    Return precautions given.   Risks, benefits, and alternatives of the medications and treatment plan prescribed today were discussed, and patient expressed understanding.   Education regarding symptom management and  diagnosis given to patient on AVS.  Continue to follow with , FNP for routine health maintenance.   Boneta Lucks and I agreed with plan.   Sherlene Shams, FNP

## 2022-05-17 ENCOUNTER — Telehealth: Payer: Self-pay | Admitting: Family

## 2022-05-17 NOTE — Telephone Encounter (Signed)
Patient called because the new dosage of Ozempic is on back order and she wants to know if she can double the order.

## 2022-05-20 ENCOUNTER — Other Ambulatory Visit: Payer: Self-pay | Admitting: Family

## 2022-05-20 MED ORDER — SEMAGLUTIDE (1 MG/DOSE) 4 MG/3ML ~~LOC~~ SOPN: 1.0000 mg | PEN_INJECTOR | SUBCUTANEOUS | 1 refills | Status: DC

## 2022-05-22 ENCOUNTER — Encounter: Payer: Self-pay | Admitting: Family

## 2022-05-22 NOTE — Telephone Encounter (Signed)
Pt is aware that she will need to continue with 1 mg and that the refill has been sent.

## 2022-05-24 ENCOUNTER — Encounter: Payer: Self-pay | Admitting: Obstetrics and Gynecology

## 2022-05-24 ENCOUNTER — Ambulatory Visit (INDEPENDENT_AMBULATORY_CARE_PROVIDER_SITE_OTHER): Payer: No Typology Code available for payment source | Admitting: Obstetrics and Gynecology

## 2022-05-24 ENCOUNTER — Other Ambulatory Visit: Payer: Self-pay

## 2022-05-24 VITALS — BP 123/88 | HR 90 | Ht 66.0 in | Wt 232.9 lb

## 2022-05-24 DIAGNOSIS — Z01419 Encounter for gynecological examination (general) (routine) without abnormal findings: Secondary | ICD-10-CM

## 2022-05-24 MED ORDER — LEVONORGEST-ETH ESTRAD 91-DAY 0.15-0.03 &0.01 MG PO TABS: 1.0000 | ORAL_TABLET | Freq: Every day | ORAL | 3 refills | Status: DC

## 2022-05-24 NOTE — Progress Notes (Signed)
HPI:      [name] is a 26 y.o. G0P0000 who LMP was Patient's last menstrual period was 05/18/2022 (exact date).  Subjective:   She presents today for her annual examination.  She is taking OCPs regularly.  She has her period every 3 months.  She likes these pills and would like to continue on them.     Hx: The following portions of the patient's history were reviewed and updated as appropriate:             She  has a past medical history of allergies, Allergy, Asthma, Depression, Diabetes mellitus without complication ([place]), and Hypertension. She does not have any pertinent problems on file. She  has a past surgical history that includes Adenoidectomy. Her family history includes Arthritis in her maternal grandmother; Colon cancer (age of onset: 60) in her paternal grandmother; Cystic kidney disease in her maternal aunt and maternal grandfather; Dementia in her paternal great-grandmother; Diabetes in her brother; Hyperlipidemia in her maternal grandfather, maternal grandmother, and mother; Hypertension in her father, maternal grandfather, maternal grandmother, and paternal grandfather; Memory loss in her maternal grandmother; Rheum arthritis in her maternal grandmother. She  reports that she has never smoked. She has never used smokeless tobacco. She reports current alcohol use of about 1.0 - 2.0 standard drink of alcohol per week. She reports that she does not use drugs. She has a current medication list which includes the following prescription(s): albuterol, epinephrine, escitalopram, levocetirizine, losartan, losartan, losartan, metformin, montelukast, qvar, semaglutide (1 mg/dose), and levonorgestrel-ethinyl estradiol. She is allergic to augmentin [amoxicillin-pot clavulanate].       Review of Systems:  Review of Systems  Constitutional: Denied constitutional symptoms, night sweats, recent illness, fatigue, fever, insomnia and weight loss.  Eyes: Denied eye symptoms, eye pain,  photophobia, vision change and visual disturbance.  Ears/Nose/Throat/Neck: Denied ear, nose, throat or neck symptoms, hearing loss, nasal discharge, sinus congestion and sore throat.  Cardiovascular: Denied cardiovascular symptoms, arrhythmia, chest pain/pressure, edema, exercise intolerance, orthopnea and palpitations.  Respiratory: Denied pulmonary symptoms, asthma, pleuritic pain, productive sputum, cough, dyspnea and wheezing.  Gastrointestinal: Denied, gastro-esophageal reflux, melena, nausea and vomiting.  Genitourinary: Denied genitourinary symptoms including symptomatic vaginal discharge, pelvic relaxation issues, and urinary complaints.  Musculoskeletal: Denied musculoskeletal symptoms, stiffness, swelling, muscle weakness and myalgia.  Dermatologic: Denied dermatology symptoms, rash and scar.  Neurologic: Denied neurology symptoms, dizziness, headache, neck pain and syncope.  Psychiatric: Denied psychiatric symptoms, anxiety and depression.  Endocrine: Denied endocrine symptoms including hot flashes and night sweats.   Meds:   Current Outpatient Medications on File Prior to Visit  Medication Sig Dispense Refill   albuterol (PROAIR HFA) 108 (90 Base) MCG/ACT inhaler Inhale 2 puffs into the lungs every 6 (six) hours as needed for wheezing or shortness of breath. 18 g 1   EPINEPHrine 0.3 mg/0.3 mL IJ SOAJ injection epinephrine 0.3 mg/0.3 mL injection, auto-injector  INJECT INTRAMUSCULARLY AS DIRECTED     escitalopram (LEXAPRO) 10 MG tablet TAKE 1 TABLET(10 MG) BY MOUTH DAILY 90 tablet 3   levocetirizine (XYZAL) 5 MG tablet Take 5 mg by mouth every evening.     losartan (COZAAR) 25 MG tablet TAKE ONE HALF TABLET (12.5 MG) DAILY WITH 50 MG DOSE FOR TOTAL OF 62.5 MG DAILY. 30 tablet 3   losartan (COZAAR) 25 MG tablet Take 0.5 tablets (12.5 mg total) by mouth daily. 90 tablet 1   losartan (COZAAR) 50 MG tablet TAKE 1 TABLET(50 MG) BY MOUTH DAILY 90 tablet  0   metFORMIN (GLUCOPHAGE-XR) 500  MG 24 hr tablet Take 2 tablets (1,000 mg total) by mouth every evening. 360 tablet 1   montelukast (SINGULAIR) 10 MG tablet TAKE 1 TABLET(10 MG) BY MOUTH AT BEDTIME 90 tablet 3   QVAR 80 MCG/ACT inhaler TAKE 2 PUFFS FIRST THING IN THE MORNING AND THEN  ANOTHER 2 PUFFS ABOUT 12  HOURS LATER. 3 Inhaler 0   Semaglutide, 1 MG/DOSE, 4 MG/3ML SOPN Inject 1 mg into the skin once a week. 3 mL 1   No current facility-administered medications on file prior to visit.     Objective:     Vitals:   05/24/22 1018  BP: 123/88  Pulse: 90    Filed Weights   05/24/22 1018  Weight: 232 lb 14.4 oz (105.6 kg)              Physical examination General NAD, Conversant  HEENT Atraumatic; Op clear with mmm.  Normo-cephalic. Pupils reactive. Anicteric sclerae  Thyroid/Neck Smooth without nodularity or enlargement. Normal ROM.  Neck Supple.  Skin No rashes, lesions or ulceration. Normal palpated skin turgor. No nodularity.  Breasts: No masses or discharge.  Symmetric.  No axillary adenopathy.  Lungs: Clear to auscultation.No rales or wheezes. Normal Respiratory effort, no retractions.  Heart: NSR.  No murmurs or rubs appreciated. No periferal edema  Abdomen: Soft.  Non-tender.  No masses.  No HSM. No hernia  Extremities: Moves all appropriately.  Normal ROM for age. No lymphadenopathy.  Neuro: Oriented to PPT.  Normal mood. Normal affect.     Pelvic:   Vulva: Normal appearance.  No lesions.  Vagina: No lesions or abnormalities noted.  Support: Normal pelvic support.  Urethra No masses tenderness or scarring.  Meatus Normal size without lesions or prolapse.  Cervix: Normal appearance.  No lesions.  Anus: Normal exam.  No lesions.  Perineum: Normal exam.  No lesions.        Bimanual   Uterus: Normal size.  Non-tender.  Mobile.  AV.  Adnexae: No masses.  Non-tender to palpation.  Cul-de-sac: Negative for abnormality.     Assessment:    G0P0000 Patient Active Problem List   Diagnosis Date Noted    HTN (hypertension) 01/24/2021   Breakthrough bleeding on OCPs 05/03/2020   Elevated blood pressure reading 05/03/2020   Open wound of finger 01/26/2020   Diabetes mellitus without complication (HCC) 07/04/2016   Depression 12/04/2015   Asthma 09/15/2013     1. Well woman exam with routine gynecological exam     Normal exam   Plan:            1.  Basic Screening Recommendations The basic screening recommendations for asymptomatic women were discussed with the patient during her visit.  The age-appropriate recommendations were discussed with her and the rational for the tests reviewed.  When I am informed by the patient that another primary care physician has previously obtained the age-appropriate tests and they are up-to-date, only outstanding tests are ordered and referrals given as necessary.  Abnormal results of tests will be discussed with her when all of her results are completed.  Routine preventative health maintenance measures emphasized: Exercise/Diet/Weight control, Tobacco Warnings, Alcohol/Substance use risks and Stress Management  2.  Continue OCPs Orders No orders of the defined types were placed in this encounter.   No orders of the defined types were placed in this encounter.        F/U  Return in about 1 year (around 05/25/2023)  for Annual Physical.  Elonda Husky, M.D. 05/24/2022 10:34 AM

## 2022-05-24 NOTE — Progress Notes (Signed)
Patients presents for annual exam today. She states continual use of OCP's with a regular cycle every 3 months. Up to date on pap smear. Patient states no other questions or concerns at this time.

## 2022-06-14 ENCOUNTER — Ambulatory Visit: Payer: No Typology Code available for payment source | Admitting: Family

## 2022-07-12 LAB — HM DIABETES EYE EXAM

## 2022-07-14 ENCOUNTER — Other Ambulatory Visit: Payer: Self-pay | Admitting: Family

## 2022-07-14 DIAGNOSIS — J4520 Mild intermittent asthma, uncomplicated: Secondary | ICD-10-CM

## 2022-07-14 DIAGNOSIS — Z0000 Encounter for general adult medical examination without abnormal findings: Secondary | ICD-10-CM

## 2022-07-14 NOTE — Progress Notes (Signed)
ABSTRACT

## 2022-08-07 ENCOUNTER — Encounter: Payer: Self-pay | Admitting: Family

## 2022-08-07 ENCOUNTER — Other Ambulatory Visit: Payer: Self-pay

## 2022-08-07 ENCOUNTER — Ambulatory Visit: Payer: No Typology Code available for payment source | Admitting: Family

## 2022-08-07 VITALS — BP 112/80 | HR 87 | Temp 98.5°F | Ht 66.0 in | Wt 233.4 lb

## 2022-08-07 DIAGNOSIS — E119 Type 2 diabetes mellitus without complications: Secondary | ICD-10-CM | POA: Diagnosis not present

## 2022-08-07 LAB — POCT GLYCOSYLATED HEMOGLOBIN (HGB A1C): Hemoglobin A1C: 7.2 % — AB (ref 4.0–5.6)

## 2022-08-07 NOTE — Assessment & Plan Note (Signed)
Lab Results  Component Value Date   HGBA1C 7.2 (A) 08/07/2022   Improvement from prior.  We discussed increasing metformin 1000 mg daily to 2000 mg by titration.  She will continue Ozempic 2 mg, losartan 62.5 mg for renal protection

## 2022-08-07 NOTE — Patient Instructions (Addendum)
Trial increase metformin to 1500mg  /daily for a couple of weeks If tolerating, increase to metformin 2000mg / daily.   Nice to see you!

## 2022-08-07 NOTE — Progress Notes (Signed)
Subjective:    [name], female    DOB: Jan 15, 1996, 27 y.o.   MRN: [id number]  CC: Amy Olson is a 26 y.o. female who presents today for follow up.   HPI: She feels well today.  No new complaints   DM- compliant with ozempic to 2mg . Continue metformin 1000mg  qd, losartan 62.5mg  for renal protection. Tolerating regimen.  No constipation or nausea.  She is avoiding sugary drinks.  HISTORY:  Past Medical History:  Diagnosis Date   allergies    Allergy    Asthma    Depression    Diabetes mellitus without complication ([place])    prediabetic   Hypertension    Past Surgical History:  Procedure Laterality Date   ADENOIDECTOMY     1998- only adenoids   Family History  Problem Relation Age of Onset   Rheum arthritis Maternal Grandmother    Hyperlipidemia Maternal Grandmother    Hypertension Maternal Grandmother    Arthritis Maternal Grandmother    Memory loss Maternal Grandmother    Hyperlipidemia Mother    Hypertension Father    Diabetes Brother    Hyperlipidemia Maternal Grandfather    Hypertension Maternal Grandfather    Cystic kidney disease Maternal Grandfather        polycystic kidney disorder    Colon cancer Paternal Grandmother 61   Hypertension Paternal Grandfather    Dementia Paternal Great-grandmother    Cystic kidney disease Maternal Aunt    Breast cancer Neg Hx    Thyroid cancer Neg Hx     Allergies: Augmentin [amoxicillin-pot clavulanate] Current Outpatient Medications on File Prior to Visit  Medication Sig Dispense Refill   albuterol (PROAIR HFA) 108 (90 Base) MCG/ACT inhaler Inhale 2 puffs into the lungs every 6 (six) hours as needed for wheezing or shortness of breath. 18 g 1   EPINEPHrine 0.3 mg/0.3 mL IJ SOAJ injection epinephrine 0.3 mg/0.3 mL injection, auto-injector  INJECT INTRAMUSCULARLY AS DIRECTED     escitalopram (LEXAPRO) 10 MG tablet TAKE 1 TABLET(10 MG) BY MOUTH DAILY 90 tablet 3   levocetirizine (XYZAL) 5 MG tablet Take 5  mg by mouth every evening.     Levonorgestrel-Ethinyl Estradiol (SIMPESSE) 0.15-0.03 &0.01 MG tablet Take 1 tablet by mouth daily. 91 tablet 3   losartan (COZAAR) 25 MG tablet Take 0.5 tablets (12.5 mg total) by mouth daily. 90 tablet 1   losartan (COZAAR) 50 MG tablet TAKE 1 TABLET(50 MG) BY MOUTH DAILY 90 tablet 0   metFORMIN (GLUCOPHAGE-XR) 500 MG 24 hr tablet Take 2 tablets (1,000 mg total) by mouth every evening. 360 tablet 1   montelukast (SINGULAIR) 10 MG tablet TAKE 1 TABLET(10 MG) BY MOUTH AT BEDTIME 90 tablet 3   QVAR 80 MCG/ACT inhaler TAKE 2 PUFFS FIRST THING IN THE MORNING AND THEN  ANOTHER 2 PUFFS ABOUT 12  HOURS LATER. 3 Inhaler 0   Semaglutide, 1 MG/DOSE, 4 MG/3ML SOPN Inject 1 mg into the skin once a week. 3 mL 1   No current facility-administered medications on file prior to visit.    Social History   Tobacco Use   Smoking status: Never   Smokeless tobacco: Never  Vaping Use   Vaping Use: Never used  Substance Use Topics   Alcohol use: Yes    Alcohol/week: 1.0 - 2.0 standard drink of alcohol    Types: 1 - 2 Standard drinks or equivalent per week    Comment: rare   Drug use: No  Review of Systems  Constitutional:  Negative for chills and fever.  Respiratory:  Negative for cough.   Cardiovascular:  Negative for chest pain and palpitations.  Gastrointestinal:  Negative for nausea and vomiting.      Objective:    BP 112/80 (BP Location: Left Arm, Patient Position: Sitting, Cuff Size: Large)   Pulse 87   Temp 98.5 F (36.9 C) (Rectal)   Ht 5\' 6"  (1.676 m)   Wt 233 lb 6.4 oz (105.9 kg)   SpO2 97%   BMI 37.67 kg/m  BP Readings from Last 3 Encounters:  08/07/22 112/80  05/24/22 123/88  05/03/22 122/80   Wt Readings from Last 3 Encounters:  08/07/22 233 lb 6.4 oz (105.9 kg)  05/24/22 232 lb 14.4 oz (105.6 kg)  05/03/22 238 lb 6.4 oz (108.1 kg)    Physical Exam Vitals reviewed.  Constitutional:      Appearance: She is well-developed.  Eyes:      Conjunctiva/sclera: Conjunctivae normal.  Cardiovascular:     Rate and Rhythm: Normal rate and regular rhythm.     Pulses: Normal pulses.     Heart sounds: Normal heart sounds.  Pulmonary:     Effort: Pulmonary effort is normal.     Breath sounds: Normal breath sounds. No wheezing, rhonchi or rales.  Skin:    General: Skin is warm and dry.  Neurological:     Mental Status: She is alert.  Psychiatric:        Speech: Speech normal.        Behavior: Behavior normal.        Thought Content: Thought content normal.        Assessment & Plan:   Problem List Items Addressed This Visit       Endocrine   Diabetes mellitus without complication ([place]) - Primary    Lab Results  Component Value Date   HGBA1C 7.2 (A) 08/07/2022  Improvement from prior.  We discussed increasing metformin 1000 mg daily to 2000 mg by titration.  She will continue Ozempic 2 mg, losartan 62.5 mg for renal protection      Relevant Orders   POCT HgB A1C (Completed)     I am having Amy Olson maintain her Qvar, EPINEPHrine, levocetirizine, albuterol, metFORMIN, escitalopram, losartan, losartan, Semaglutide (1 MG/DOSE), Levonorgestrel-Ethinyl Estradiol, and montelukast.   No orders of the defined types were placed in this encounter.   Return precautions given.   Risks, benefits, and alternatives of the medications and treatment plan prescribed today were discussed, and patient expressed understanding.   Education regarding symptom management and diagnosis given to patient on AVS.  Continue to follow with Burnard Hawthorne, FNP for routine health maintenance.   Amy Olson and I agreed with plan.   Mable Paris, FNP

## 2022-08-29 ENCOUNTER — Other Ambulatory Visit: Payer: Self-pay | Admitting: Family

## 2022-08-29 DIAGNOSIS — E119 Type 2 diabetes mellitus without complications: Secondary | ICD-10-CM

## 2022-08-31 ENCOUNTER — Encounter: Payer: Self-pay | Admitting: Family

## 2022-09-01 ENCOUNTER — Other Ambulatory Visit: Payer: Self-pay | Admitting: Family

## 2022-09-01 DIAGNOSIS — E119 Type 2 diabetes mellitus without complications: Secondary | ICD-10-CM

## 2022-11-13 ENCOUNTER — Ambulatory Visit: Payer: Commercial Managed Care - PPO | Admitting: Family

## 2022-11-13 ENCOUNTER — Encounter: Payer: Self-pay | Admitting: Family

## 2022-11-13 VITALS — BP 118/78 | HR 80 | Temp 98.4°F | Ht 66.0 in | Wt 237.2 lb

## 2022-11-13 DIAGNOSIS — E119 Type 2 diabetes mellitus without complications: Secondary | ICD-10-CM

## 2022-11-13 DIAGNOSIS — R7309 Other abnormal glucose: Secondary | ICD-10-CM

## 2022-11-13 LAB — POCT GLYCOSYLATED HEMOGLOBIN (HGB A1C): Hemoglobin A1C: 6.7 % — AB (ref 4.0–5.6)

## 2022-11-13 MED ORDER — TIRZEPATIDE 5 MG/0.5ML ~~LOC~~ SOAJ: 5.0000 mg | SUBCUTANEOUS | 3 refills | Status: DC

## 2022-11-13 MED ORDER — METFORMIN HCL 500 MG PO TABS: 1000.0000 mg | ORAL_TABLET | Freq: Two times a day (BID) | ORAL | 3 refills | Status: DC

## 2022-11-13 NOTE — Patient Instructions (Signed)
Stop ozempic Start mounjaro  If Amy Olson is not covered through [employer], you may go to the [employer] at [email or web address] to complete information for savings card.   You may also use the link below.   [email or web address]  You may take this savings to [facility], [facility] or [facility]. If you are unable to get mounjaro, please call to schedule an appointment with me so we can discuss alternative weight loss medications.   start Mounjaro 2.5mg  once per week injected subcutaneously ( [place])  in stomach. Please clean with alcohol swab prior to injection and be sure to rotate site. You may schedule a nurse visit if you would like to first injection.   After 4 weeks, and if tolerated and weight loss has not reached 1-2 lbs per week, please increase to 5mg  once per week [place].    Please read information on medication below and remember black box warning that you may not take if you or a family member is diagnosed with thyroid cancer (medullary thyroid cancer), or multiple endocrine neoplasia.       Tirzepatide Injection Amy Olson) What is this medication? TIRZEPATIDE (tir ZEP a tide) treats type 2 diabetes. It works by increasing insulin levels in your body, which decreases your blood sugar (glucose). Changes to diet and exercise are often combined with this medication. This medicine may be used for other purposes; ask your health care provider or pharmacist if you have questions. COMMON BRAND NAME(S): MOUNJARO What should I tell my care team before I take this medication? They need to know if you have any of these conditions: Endocrine tumors (MEN 2) or if someone in your family had these tumors Eye disease, vision problems Gallbladder disease History of pancreatitis Kidney disease Stomach or intestine problems Thyroid cancer or if someone in your family had thyroid cancer An unusual or allergic reaction to tirzepatide, other medications, foods, dyes,  or preservatives Pregnant or trying to get pregnant Breast-feeding How should I use this medication? This medication is injected under the skin. You will be taught how to prepare and give it. It is given once every week (every 7 days). Keep taking it unless your health care provider tells you to stop. If you use this medication with insulin, you should inject this medication and the insulin separately. Do not mix them together. Do not give the injections right next to each other. Change (rotate) injection sites with each injection. This medication comes with INSTRUCTIONS FOR USE. Ask your pharmacist for directions on how to use this medication. Read the information carefully. Talk to your pharmacist or care team if you have questions. It is important that you put your used needles and syringes in a special sharps container. Do not put them in a trash can. If you do not have a sharps container, call your pharmacist or care team to get one. A special MedGuide will be given to you by the pharmacist with each prescription and refill. Be sure to read this information carefully each time. Talk to your care team about the use of this medication in children. Special care may be needed. Overdosage: If you think you have taken too much of this medicine contact a poison control center or emergency room at once. NOTE: This medicine is only for you. Do not share this medicine with others. What if I miss a dose? If you miss a dose, take it as soon as you can unless it is more than 4 days (96 hours) late. If it is  more than 4 days late, skip the missed dose. Take the next dose at the normal time. Do not take 2 doses within 3 days of each other. What may interact with this medication? Alcohol containing beverages Antiviral medications for HIV or AIDS Aspirin and aspirin-like medications Beta-blockers like atenolol, metoprolol, propranolol Certain medications for blood pressure, heart disease, irregular heart  beat Chromium Clonidine Diuretics Female hormones, such as estrogens or progestins, birth control pills Fenofibrate Gemfibrozil Guanethidine Isoniazid Lanreotide Female hormones or anabolic steroids MAOIs like Carbex, Eldepryl, Marplan, Nardil, and Parnate Medications for weight loss Medications for allergies, asthma, cold, or cough Medications for depression, anxiety, or psychotic disturbances Niacin Nicotine NSAIDs, medications for pain and inflammation, like ibuprofen or naproxen Octreotide Other medications for diabetes, like glyburide, glipizide, or glimepiride Pasireotide Pentamidine Phenytoin Probenecid Quinolone antibiotics such as ciprofloxacin, levofloxacin, ofloxacin Reserpine Some herbal dietary supplements Steroid medications such as prednisone or cortisone Sulfamethoxazole; trimethoprim Thyroid hormones Warfarin This list may not describe all possible interactions. Give your health care provider a list of all the medicines, herbs, non-prescription drugs, or dietary supplements you use. Also tell them if you smoke, drink alcohol, or use illegal drugs. Some items may interact with your medicine. What should I watch for while using this medication? Visit your care team for regular checks on your progress. Drink plenty of fluids while taking this medication. Check with your care team if you get an attack of severe diarrhea, nausea, and vomiting. The loss of too much body fluid can make it dangerous for you to take this medication. A test called the HbA1C (A1C) will be monitored. This is a simple blood test. It measures your blood sugar control over the last 2 to 3 months. You will receive this test every 3 to 6 months. Learn how to check your blood sugar. Learn the symptoms of low and high blood sugar and how to manage them. Always carry a quick-source of sugar with you in case you have symptoms of low blood sugar. Examples include hard sugar candy or glucose tablets.  Make sure others know that you can choke if you eat or drink when you develop serious symptoms of low blood sugar, such as seizures or unconsciousness. They must get medical help at once. Tell your care team if you have high blood sugar. You might need to change the dose of your medication. If you are sick or exercising more than usual, you might need to change the dose of your medication. Do not skip meals. Ask your care team if you should avoid alcohol. Many nonprescription cough and cold products contain sugar or alcohol. These can affect blood sugar. Pens should never be shared. Even if the needle is changed, sharing may result in passing of viruses like hepatitis or HIV. Wear a medical ID bracelet or chain, and carry a card that describes your disease and details of your medication and dosage times. Birth control may not work properly while you are taking this medication. If you take birth control pills by mouth, your care team may recommend another type of birth control for 4 weeks after you start this medication and for 4 weeks after each increase in your dose of this medication. Ask your care team which birth control methods you should use. What side effects may I notice from receiving this medication? Side effects that you should report to your care team as soon as possible: Allergic reactions-skin rash, itching, hives, swelling of the face, lips, tongue, or throat Change in  vision Dehydration-increased thirst, dry mouth, feeling faint or lightheaded, headache, dark yellow or brown urine Gallbladder problems-severe stomach pain, nausea, vomiting, fever Kidney injury-decrease in the amount of urine, swelling of the ankles, hands, or feet Pancreatitis-severe stomach pain that spreads to your back or gets worse after eating or when touched, fever, nausea, vomiting Thyroid cancer-new mass or lump in the neck, pain or trouble swallowing, trouble breathing, hoarseness Side effects that usually do  not require medical attention (report these to your care team if they continue or are bothersome): Constipation Diarrhea Loss of Appetite Nausea Stomach pain Upset stomach Vomiting This list may not describe all possible side effects. Call your doctor for medical advice about side effects. You may report side effects to FDA at 1-800-FDA-1088. Where should I keep my medication? Keep out of the reach of children and pets. Refrigeration (preferred): Store unopened pens in a refrigerator between 2 and 8 degrees C (36 and 46 degrees F). Keep it in the original carton until you are ready to take it. Do not freeze or use if the medication has been frozen. Protect from light. Get rid of any unused medication after the expiration date on the label. Room Temperature: The pen may be stored at room temperature below 30 degrees C (86 degrees F) for up to a total of 21 days if needed. Protect from light. Avoid exposure to extreme heat. If it is stored at room temperature, throw away any unused medication after 21 days or after it expires, whichever is first. The pen has glass parts. Handle it carefully. If you drop the pen on a hard surface, do not use it. Use a new pen for your injection. To get rid of medications that are no longer needed or have expired: Take the medication to a medication take-back program. Check with your pharmacy or law enforcement to find a location. If you cannot return the medication, ask your pharmacist or care team how to get rid of this medication safely. NOTE: This sheet is a summary. It may not cover all possible information. If you have questions about this medicine, talk to your doctor, pharmacist, or health care provider.  2022 Elsevier/Gold Standard (2021-03-08 13:57:48)

## 2022-11-13 NOTE — Assessment & Plan Note (Addendum)
Lab Results  Component Value Date   HGBA1C 6.7 (A) 11/13/2022   Excellent control. Continue metformin 1000mg  BID. Stop ozempic 2mg  , start  mounjaro 2.5mg  ( sample provided for one month) to see if more effective with weight loss. Prescribed mounjaro 5mg  for following month. Continue losartan 50mg  QD for renal protection. Will repeat  urine microalbumin at follow up.

## 2022-11-13 NOTE — Progress Notes (Signed)
Assessment & Plan:  Diabetes mellitus without complication [facility]) Assessment & Plan: Lab Results  Component Value Date   HGBA1C 6.7 (A) 11/13/2022   Excellent control. Continue metformin 1000mg  BID. Stop ozempic 2mg  , start  mounjaro 2.5mg  ( sample provided for one month) to see if more effective with weight loss. Prescribed mounjaro 5mg  for following month. Continue losartan 50mg  QD for renal protection. Will repeat  urine microalbumin at follow up.   Orders: -     metFORMIN HCl; Take 2 tablets (1,000 mg total) by mouth 2 (two) times daily with a meal.  Dispense: 360 tablet; Refill: 3 -     Tirzepatide; Inject 5 mg into the skin once a week.  Dispense: 6 mL; Refill: 3  Elevated glucose -     POCT glycosylated hemoglobin (Hb A1C) -     Comprehensive metabolic panel     Return precautions given.   Risks, benefits, and alternatives of the medications and treatment plan prescribed today were discussed, and patient expressed understanding.   Education regarding symptom management and diagnosis given to patient on AVS either electronically or printed.  No follow-ups on file.  Mable Paris, FNP  Subjective:    [name], female    DOB: [birth year], 27 y.o.   MRN: [id number]  CC: Amy Olson is a 27 y.o. female who presents today for follow up.   HPI: Feels well today No new complaints     Compliant with ozempic 2 mg, metformin 2000mg /day. She has not lost weight on ozempic. Tolerating medication.   Recently she ran out of 25mg  tablet of losartan and has been taking losartan 650mg  qd.   No concerns for pregnancy Allergies: Augmentin [amoxicillin-pot clavulanate] Current Outpatient Medications on File Prior to Visit  Medication Sig Dispense Refill   albuterol (PROAIR HFA) 108 (90 Base) MCG/ACT inhaler Inhale 2 puffs into the lungs every 6 (six) hours as needed for wheezing or shortness of breath. 18 g 1   EPINEPHrine 0.3 mg/0.3 mL IJ SOAJ injection  epinephrine 0.3 mg/0.3 mL injection, auto-injector  INJECT INTRAMUSCULARLY AS DIRECTED     escitalopram (LEXAPRO) 10 MG tablet TAKE 1 TABLET(10 MG) BY MOUTH DAILY 90 tablet 3   levocetirizine (XYZAL) 5 MG tablet Take 5 mg by mouth every evening.     Levonorgestrel-Ethinyl Estradiol (SIMPESSE) 0.15-0.03 &0.01 MG tablet Take 1 tablet by mouth daily. 91 tablet 3   losartan (COZAAR) 50 MG tablet TAKE 1 TABLET(50 MG) BY MOUTH DAILY 90 tablet 0   montelukast (SINGULAIR) 10 MG tablet TAKE 1 TABLET(10 MG) BY MOUTH AT BEDTIME 90 tablet 3   QVAR 80 MCG/ACT inhaler TAKE 2 PUFFS FIRST THING IN THE MORNING AND THEN  ANOTHER 2 PUFFS ABOUT 12  HOURS LATER. 3 Inhaler 0   Semaglutide, 1 MG/DOSE, 4 MG/3ML SOPN Inject 1 mg into the skin once a week. 3 mL 1   No current facility-administered medications on file prior to visit.    Review of Systems  Constitutional:  Negative for chills and fever.  Respiratory:  Negative for cough.   Cardiovascular:  Negative for chest pain and palpitations.  Gastrointestinal:  Negative for nausea and vomiting.      Objective:    BP 118/78   Pulse 80   Temp 98.4 F (36.9 C) (Oral)   Ht 5\' 6"  (1.676 m)   Wt 237 lb 3.2 oz (107.6 kg)   LMP  (LMP Unknown)   SpO2 97%   BMI 38.29  kg/m  BP Readings from Last 3 Encounters:  11/13/22 118/78  08/07/22 112/80  05/24/22 123/88   Wt Readings from Last 3 Encounters:  11/13/22 237 lb 3.2 oz (107.6 kg)  08/07/22 233 lb 6.4 oz (105.9 kg)  05/24/22 232 lb 14.4 oz (105.6 kg)    Physical Exam Vitals reviewed.  Constitutional:      Appearance: She is well-developed.  HENT:     Head: Normocephalic and atraumatic.     Right Ear: Hearing normal. No swelling or tenderness. No middle ear effusion. Tympanic membrane is not erythematous or bulging.     Left Ear: No swelling or tenderness.  No middle ear effusion. Tympanic membrane is not erythematous or bulging.     Nose:     Right Sinus: No maxillary sinus tenderness or frontal  sinus tenderness.     Left Sinus: No maxillary sinus tenderness or frontal sinus tenderness.     Mouth/Throat:     Pharynx: Uvula midline.  Eyes:     General: Lids are normal. Lids are everted, no foreign bodies appreciated.  Cardiovascular:     Rate and Rhythm: Normal rate and regular rhythm.     Pulses: Normal pulses.     Heart sounds: Normal heart sounds.  Pulmonary:     Effort: Pulmonary effort is normal.     Breath sounds: Normal breath sounds. No wheezing, rhonchi or rales.  Skin:    General: Skin is warm and dry.  Neurological:     Mental Status: She is alert.     Sensory: No sensory deficit.  Psychiatric:        Speech: Speech normal.        Behavior: Behavior normal.        Thought Content: Thought content normal.

## 2022-11-23 ENCOUNTER — Other Ambulatory Visit: Payer: Self-pay | Admitting: Family

## 2022-11-23 DIAGNOSIS — E119 Type 2 diabetes mellitus without complications: Secondary | ICD-10-CM

## 2022-12-14 ENCOUNTER — Encounter: Payer: Self-pay | Admitting: Family

## 2022-12-15 NOTE — Telephone Encounter (Signed)
LVM to call back.

## 2022-12-15 NOTE — Telephone Encounter (Signed)
Pt returned call. Jenate was not available at the moment to answer call.

## 2022-12-15 NOTE — Telephone Encounter (Signed)
Spoke to pt and she stated that she is still having HA but she  is not having any  vision changes no facial numbness . I scheduled her appt for 12/19/22 to come in to discuss

## 2022-12-19 ENCOUNTER — Encounter: Payer: Self-pay | Admitting: Family

## 2022-12-19 ENCOUNTER — Ambulatory Visit: Payer: Commercial Managed Care - PPO | Admitting: Family

## 2022-12-19 ENCOUNTER — Telehealth: Payer: Self-pay | Admitting: Family

## 2022-12-19 VITALS — BP 138/88 | HR 95 | Temp 97.9°F | Ht 66.0 in | Wt 232.8 lb

## 2022-12-19 DIAGNOSIS — I10 Essential (primary) hypertension: Secondary | ICD-10-CM | POA: Diagnosis not present

## 2022-12-19 DIAGNOSIS — R519 Headache, unspecified: Secondary | ICD-10-CM | POA: Diagnosis not present

## 2022-12-19 MED ORDER — LOSARTAN POTASSIUM 25 MG PO TABS
12.5000 mg | ORAL_TABLET | Freq: Every day | ORAL | 1 refills | Status: DC
Start: 2022-12-19 — End: 2023-01-24

## 2022-12-19 NOTE — Assessment & Plan Note (Addendum)
Reassuring neurologic exam.  We discussed at length whether HA is menstrual related or r/t HTN Pending MRI brain due to positional component of HA, change in HA intensity ( stabbing) from previous HA.  Start magnesium citrate '400mg'$  QD for prevention. Consider change from lexapro to effexor if HA persists. Close follow up.

## 2022-12-19 NOTE — Patient Instructions (Signed)
Continue losartan 62.5 mg  Start magnesium citrate '400mg'$  daily.   Often as a daily preventative, I recommend taking magnesium citrate with riboflavin '400mg'$  daily and CoQ10 '100mg'$  three times daily. You may find these supplements over the counter.    Please let me know how you are doing.

## 2022-12-19 NOTE — Assessment & Plan Note (Signed)
Improved however not yet at goal. We agreed to give losartan 62.'5mg'$  more time since increased 3 days ago. Close follow up.

## 2022-12-19 NOTE — Telephone Encounter (Signed)
Lft pt vm to call ofc . thanks 

## 2022-12-19 NOTE — Telephone Encounter (Signed)
Pt has appt in office today

## 2022-12-19 NOTE — Progress Notes (Signed)
Assessment & Plan:  Acute nonintractable headache, unspecified headache type Assessment & Plan: Reassuring neurologic exam.  We discussed at length whether HA is menstrual related or r/t HTN Pending MRI brain due to positional component of HA, change in HA intensity ( stabbing) from previous HA.  Start magnesium citrate '400mg'$  QD for prevention. Consider change from lexapro to effexor if HA persists. Close follow up.   Orders: -     MR BRAIN W WO CONTRAST; Future  Hypertension, unspecified type Assessment & Plan: Improved however not yet at goal. We agreed to give losartan 62.'5mg'$  more time since increased 3 days ago. Close follow up.   Orders: -     Losartan Potassium; Take 0.5 tablets (12.5 mg total) by mouth daily.  Dispense: 90 tablet; Refill: 1     Return precautions given.   Risks, benefits, and alternatives of the medications and treatment plan prescribed today were discussed, and patient expressed understanding.   Education regarding symptom management and diagnosis given to patient on AVS either electronically or printed.  Return in about 1 month (around 01/17/2023).  Mable Paris, FNP  Subjective:    [name], female    DOB: 1995-11-07, 27 y.o.   MRN: [id number]  CC: Amy Olson is a 27 y.o. female who presents today for an acute visit.    HPI: Here today due to complaint of headaches x 6 days, improved. Constant. 'Slight' HA today. There are times when 'stabbing pain' right side of neck. Right sided which is typical for her. HA feels different that HA in the past.      She noticed 3 days ago blood pressure elevated and suspect this may be the culprit for her headaches.  She has resumed losartan 62.5 mg 3 days ago  BP at home 138.96 ( had HA at that time) and 20 minutes was 129/95.  121/89 and last night 134/94.    HA has improved with resuming additional losartan 12.'5mg'$  however still present this morning  No vision loss, vision changes,  facial numbness, neck pain, vomiting, nausea, photophobia, sinus congestion, pulsatile.  HA is worse when laying down. Wake up in the morning and HA will be present.  HA not a/w sneezing, valsava.  Approx 5 HA's per month.  Ibuprofen with temporarily relief.  She is on her menses this week and she is cycling on birth control every 3 months; started on OCP due to h/o  migraine. No h/o aura. HA is somewhat cyclical occurring this month and last month around the time she would have expected menses.        Allergies: Augmentin [amoxicillin-pot clavulanate] Current Outpatient Medications on File Prior to Visit  Medication Sig Dispense Refill   albuterol (PROAIR HFA) 108 (90 Base) MCG/ACT inhaler Inhale 2 puffs into the lungs every 6 (six) hours as needed for wheezing or shortness of breath. 18 g 1   EPINEPHrine 0.3 mg/0.3 mL IJ SOAJ injection epinephrine 0.3 mg/0.3 mL injection, auto-injector  INJECT INTRAMUSCULARLY AS DIRECTED     escitalopram (LEXAPRO) 10 MG tablet TAKE 1 TABLET(10 MG) BY MOUTH DAILY 90 tablet 3   levocetirizine (XYZAL) 5 MG tablet Take 5 mg by mouth every evening.     Levonorgestrel-Ethinyl Estradiol (SIMPESSE) 0.15-0.03 &0.01 MG tablet Take 1 tablet by mouth daily. 91 tablet 3   losartan (COZAAR) 50 MG tablet TAKE 1 TABLET(50 MG) BY MOUTH DAILY 90 tablet 0   metFORMIN (GLUCOPHAGE) 500 MG tablet Take 2 tablets (1,000  mg total) by mouth 2 (two) times daily with a meal. 360 tablet 3   montelukast (SINGULAIR) 10 MG tablet TAKE 1 TABLET(10 MG) BY MOUTH AT BEDTIME 90 tablet 3   QVAR 80 MCG/ACT inhaler TAKE 2 PUFFS FIRST THING IN THE MORNING AND THEN  ANOTHER 2 PUFFS ABOUT 12  HOURS LATER. 3 Inhaler 0   Semaglutide, 1 MG/DOSE, 4 MG/3ML SOPN Inject 1 mg into the skin once a week. 3 mL 1   tirzepatide (MOUNJARO) 5 MG/0.5ML Pen Inject 5 mg into the skin once a week. 6 mL 3   No current facility-administered medications on file prior to visit.    Review of Systems   Constitutional:  Negative for chills and fever.  Eyes:  Negative for visual disturbance.  Respiratory:  Negative for cough.   Cardiovascular:  Negative for chest pain and palpitations.  Gastrointestinal:  Negative for nausea and vomiting.  Neurological:  Positive for headaches. Negative for dizziness.      Objective:    BP 138/88   Pulse 95   Temp 97.9 F (36.6 C) (Oral)   Ht '5\' 6"'$  (1.676 m)   Wt 232 lb 12.8 oz (105.6 kg)   LMP  (LMP Unknown)   SpO2 96%   BMI 37.57 kg/m   BP Readings from Last 3 Encounters:  12/19/22 138/88  11/13/22 118/78  08/07/22 112/80   Wt Readings from Last 3 Encounters:  12/19/22 232 lb 12.8 oz (105.6 kg)  11/13/22 237 lb 3.2 oz (107.6 kg)  08/07/22 233 lb 6.4 oz (105.9 kg)    Physical Exam Vitals reviewed.  Constitutional:      Appearance: She is well-developed.  HENT:     Mouth/Throat:     Pharynx: Uvula midline.  Eyes:     Conjunctiva/sclera: Conjunctivae normal.     Pupils: Pupils are equal, round, and reactive to light.     Comments: Fundus normal bilaterally.   Cardiovascular:     Rate and Rhythm: Normal rate and regular rhythm.     Pulses: Normal pulses.     Heart sounds: Normal heart sounds.  Pulmonary:     Effort: Pulmonary effort is normal.     Breath sounds: Normal breath sounds. No wheezing, rhonchi or rales.  Skin:    General: Skin is warm and dry.  Neurological:     Mental Status: She is alert.     Cranial Nerves: No cranial nerve deficit.     Sensory: No sensory deficit.     Deep Tendon Reflexes:     Reflex Scores:      Bicep reflexes are 2+ on the right side and 2+ on the left side.      Patellar reflexes are 2+ on the right side and 2+ on the left side.    Comments: Grip equal and strong bilateral upper extremities. Gait strong and steady. Able to perform rapid alternating movement without difficulty.   Psychiatric:        Speech: Speech normal.        Behavior: Behavior normal.        Thought Content: Thought  content normal.

## 2022-12-22 ENCOUNTER — Telehealth: Payer: Self-pay | Admitting: Family

## 2022-12-22 NOTE — Telephone Encounter (Signed)
Lft pt vm to call ofc following back up with pt regarding if she spoke with her ins about pricing. thanks

## 2023-01-17 ENCOUNTER — Ambulatory Visit: Payer: Commercial Managed Care - PPO | Admitting: Family

## 2023-01-17 ENCOUNTER — Telehealth: Payer: Self-pay

## 2023-01-17 NOTE — Telephone Encounter (Signed)
NOTED! Thanks Maudie Mercury!

## 2023-01-17 NOTE — Telephone Encounter (Signed)
Patient states she is returning call from Jenate [place], [place].  I read Jenate's message to patient.  Patient is ok to reschedule her appointment from today to 01/31/2023.  Patient has been scheduled to see Mable Paris, FNP, on 01/31/2023.

## 2023-01-17 NOTE — Telephone Encounter (Signed)
LVM to call back to move pt appt to another day if possible per Joycelyn Schmid.

## 2023-01-24 ENCOUNTER — Ambulatory Visit: Payer: Commercial Managed Care - PPO | Admitting: Family

## 2023-01-24 ENCOUNTER — Encounter: Payer: Self-pay | Admitting: Family

## 2023-01-24 VITALS — BP 136/88 | HR 86 | Temp 98.1°F | Ht 66.0 in | Wt 230.2 lb

## 2023-01-24 DIAGNOSIS — E119 Type 2 diabetes mellitus without complications: Secondary | ICD-10-CM | POA: Diagnosis not present

## 2023-01-24 DIAGNOSIS — R519 Headache, unspecified: Secondary | ICD-10-CM | POA: Diagnosis not present

## 2023-01-24 DIAGNOSIS — I10 Essential (primary) hypertension: Secondary | ICD-10-CM

## 2023-01-24 MED ORDER — LOSARTAN POTASSIUM 25 MG PO TABS: 25.0000 mg | ORAL_TABLET | Freq: Every day | ORAL | 1 refills | Status: DC

## 2023-01-24 MED ORDER — TIRZEPATIDE 7.5 MG/0.5ML ~~LOC~~ SOAJ: 7.5000 mg | SUBCUTANEOUS | 2 refills | Status: DC

## 2023-01-24 NOTE — Progress Notes (Signed)
Assessment & Plan:  Diabetes mellitus without complication Assessment & Plan: Lab Results  Component Value Date   HGBA1C 6.7 (A) 11/13/2022   Excellent control.  For additional benefit of weight loss, increase Mounjaro to 7.5 mg  Orders: -     Tirzepatide; Inject 7.5 mg into the skin once a week.  Dispense: 6 mL; Refill: 2 -     Basic metabolic panel; Future -     Microalbumin / creatinine urine ratio; Future  Hypertension, unspecified type Assessment & Plan: Suboptimal control.  Discussed blood pressure goal being less than 120/80.  Increase losartan to 75 mg daily total  Orders: -     Losartan Potassium; Take 1 tablet (25 mg total) by mouth daily.  Dispense: 90 tablet; Refill: 1  Acute nonintractable headache, unspecified headache type Assessment & Plan: No recurrence of headache.  Discussed alarm features including positional headache, worst headache of life, change in severity or presentation of headache in and of itself which would warrant neuroimaging.  Patient politely declines proceeding with MRI brain at this time.  Continue magnesium citrate 400 mg daily.      Return precautions given.   Risks, benefits, and alternatives of the medications and treatment plan prescribed today were discussed, and patient expressed understanding.   Education regarding symptom management and diagnosis given to patient on AVS either electronically or printed.  Return in about 4 months (around 05/26/2023).  Rennie Plowman, FNP  Subjective:    [name], female    DOB: [birth year], 27 y.o.   MRN: [id number]  CC: Amy Olson is a 27 y.o. female who presents today for follow up.   HPI: She is interested in increasing Mounjaro for additional weight loss.  She tolerated medication well without nausea, constipation.   She is not proceeding with MRI brain due to cost.   Compliant with losartan 62.5mg . BP at home 129/92, 122/86, 131/92, 130/80   She has not HA since our  last visit 6 weeks ago.  Compliant with magnesium citrate 400 mg daily   Allergies: Augmentin [amoxicillin-pot clavulanate] Current Outpatient Medications on File Prior to Visit  Medication Sig Dispense Refill   albuterol (PROAIR HFA) 108 (90 Base) MCG/ACT inhaler Inhale 2 puffs into the lungs every 6 (six) hours as needed for wheezing or shortness of breath. 18 g 1   EPINEPHrine 0.3 mg/0.3 mL IJ SOAJ injection epinephrine 0.3 mg/0.3 mL injection, auto-injector  INJECT INTRAMUSCULARLY AS DIRECTED     escitalopram (LEXAPRO) 10 MG tablet TAKE 1 TABLET(10 MG) BY MOUTH DAILY 90 tablet 3   levocetirizine (XYZAL) 5 MG tablet Take 5 mg by mouth every evening.     Levonorgestrel-Ethinyl Estradiol (SIMPESSE) 0.15-0.03 &0.01 MG tablet Take 1 tablet by mouth daily. 91 tablet 3   losartan (COZAAR) 50 MG tablet TAKE 1 TABLET(50 MG) BY MOUTH DAILY 90 tablet 0   metFORMIN (GLUCOPHAGE) 500 MG tablet Take 2 tablets (1,000 mg total) by mouth 2 (two) times daily with a meal. 360 tablet 3   montelukast (SINGULAIR) 10 MG tablet TAKE 1 TABLET(10 MG) BY MOUTH AT BEDTIME 90 tablet 3   QVAR 80 MCG/ACT inhaler TAKE 2 PUFFS FIRST THING IN THE MORNING AND THEN  ANOTHER 2 PUFFS ABOUT 12  HOURS LATER. 3 Inhaler 0   No current facility-administered medications on file prior to visit.    Review of Systems  Constitutional:  Negative for chills and fever.  Respiratory:  Negative for cough.   Cardiovascular:  Negative for chest pain and palpitations.  Gastrointestinal:  Negative for nausea and vomiting.      Objective:    BP 136/88   Pulse 86   Temp 98.1 F (36.7 C) (Oral)   Ht 5\' 6"  (1.676 m)   Wt 230 lb 3.2 oz (104.4 kg)   LMP  (LMP Unknown)   SpO2 97%   BMI 37.16 kg/m  BP Readings from Last 3 Encounters:  01/24/23 136/88  12/19/22 138/88  11/13/22 118/78   Wt Readings from Last 3 Encounters:  01/24/23 230 lb 3.2 oz (104.4 kg)  12/19/22 232 lb 12.8 oz (105.6 kg)  11/13/22 237 lb 3.2 oz (107.6 kg)     Physical Exam Vitals reviewed.  Constitutional:      Appearance: She is well-developed.  Eyes:     Conjunctiva/sclera: Conjunctivae normal.  Cardiovascular:     Rate and Rhythm: Normal rate and regular rhythm.     Pulses: Normal pulses.     Heart sounds: Normal heart sounds.  Pulmonary:     Effort: Pulmonary effort is normal.     Breath sounds: Normal breath sounds. No wheezing, rhonchi or rales.  Skin:    General: Skin is warm and dry.  Neurological:     Mental Status: She is alert.  Psychiatric:        Speech: Speech normal.        Behavior: Behavior normal.        Thought Content: Thought content normal.

## 2023-01-29 ENCOUNTER — Encounter: Payer: Self-pay | Admitting: Family

## 2023-01-29 NOTE — Assessment & Plan Note (Signed)
Lab Results  Component Value Date   HGBA1C 6.7 (A) 11/13/2022   Excellent control.  For additional benefit of weight loss, increase Mounjaro to 7.5 mg

## 2023-01-29 NOTE — Telephone Encounter (Signed)
Called [facility] and they stated that the 7.5 mg Marny Lowenstein is out of stock and they do not have a date as to when it will be back in stock

## 2023-01-29 NOTE — Assessment & Plan Note (Signed)
Suboptimal control.  Discussed blood pressure goal being less than 120/80.  Increase losartan to 75 mg daily total

## 2023-01-29 NOTE — Patient Instructions (Addendum)
Increase losartan to a total daily dose of 75 mg daily.  Goal of blood pressures less than 120/80.  Please let me know if headache were to return.    Nice to see you!

## 2023-01-29 NOTE — Assessment & Plan Note (Signed)
No recurrence of headache.  Discussed alarm features including positional headache, worst headache of life, change in severity or presentation of headache in and of itself which would warrant neuroimaging.  Patient politely declines proceeding with MRI brain at this time.  Continue magnesium citrate 400 mg daily.

## 2023-01-31 ENCOUNTER — Telehealth: Payer: Self-pay

## 2023-01-31 ENCOUNTER — Ambulatory Visit: Payer: Commercial Managed Care - PPO | Admitting: Family

## 2023-01-31 ENCOUNTER — Other Ambulatory Visit (HOSPITAL_COMMUNITY): Payer: Self-pay

## 2023-01-31 NOTE — Telephone Encounter (Signed)
Patient Advocate Encounter   Received notification from [employer] that prior authorization is required for [facility] 7.5MG /0.5ML pen-injectors   Submitted: 01-31-2023 Key [id number]  Status is pending

## 2023-02-01 ENCOUNTER — Other Ambulatory Visit (HOSPITAL_COMMUNITY): Payer: Self-pay

## 2023-02-01 NOTE — Telephone Encounter (Signed)
Patient Advocate Encounter  Prior Authorization for Mounjaro 7.5MG /0.5ML pen-injectors has been approved.    Key: [id number]  Effective: 02-01-2023 to 01-31-2024  Test claim returns a $25.00 co-pay for an 84 day supply

## 2023-02-02 ENCOUNTER — Other Ambulatory Visit (INDEPENDENT_AMBULATORY_CARE_PROVIDER_SITE_OTHER): Payer: Commercial Managed Care - PPO

## 2023-02-02 DIAGNOSIS — E119 Type 2 diabetes mellitus without complications: Secondary | ICD-10-CM

## 2023-02-02 LAB — BASIC METABOLIC PANEL
BUN: 13 mg/dL (ref 6–23)
CO2: 24 mEq/L (ref 19–32)
Calcium: 9.3 mg/dL (ref 8.4–10.5)
Chloride: 102 mEq/L (ref 96–112)
Creatinine, Ser: 0.9 mg/dL (ref 0.40–1.20)
GFR: 86.96 mL/min (ref >60.00)
Glucose, Bld: 127 mg/dL — ABNORMAL HIGH (ref 70–99)
Potassium: 4.4 mEq/L (ref 3.5–5.1)
Sodium: 136 mEq/L (ref 135–145)

## 2023-02-02 LAB — MICROALBUMIN / CREATININE URINE RATIO
Creatinine,U: 243.1 mg/dL
Microalb Creat Ratio: 0.6 mg/g (ref 0.0–30.0)
Microalb, Ur: 1.4 mg/dL (ref 0.0–1.9)

## 2023-02-02 NOTE — Telephone Encounter (Signed)
Pt is aware.  

## 2023-02-12 ENCOUNTER — Ambulatory Visit: Payer: Commercial Managed Care - PPO | Admitting: Family

## 2023-02-19 ENCOUNTER — Other Ambulatory Visit: Payer: Self-pay | Admitting: Family

## 2023-02-19 DIAGNOSIS — E119 Type 2 diabetes mellitus without complications: Secondary | ICD-10-CM

## 2023-02-26 ENCOUNTER — Encounter: Payer: Self-pay | Admitting: Family

## 2023-03-01 NOTE — Telephone Encounter (Signed)
Spoke to Pelzer at the pharmacy and she stated that the 25 mg Losartan would be ready for pickup on 03/02/23 anytime after 11 am. Pt is aware

## 2023-03-14 ENCOUNTER — Other Ambulatory Visit: Payer: Self-pay | Admitting: Family

## 2023-03-14 DIAGNOSIS — R413 Other amnesia: Secondary | ICD-10-CM

## 2023-03-14 DIAGNOSIS — F418 Other specified anxiety disorders: Secondary | ICD-10-CM

## 2023-05-23 ENCOUNTER — Other Ambulatory Visit: Payer: Self-pay | Admitting: Family

## 2023-05-23 ENCOUNTER — Other Ambulatory Visit: Payer: Self-pay | Admitting: Obstetrics and Gynecology

## 2023-05-23 DIAGNOSIS — Z01419 Encounter for gynecological examination (general) (routine) without abnormal findings: Secondary | ICD-10-CM

## 2023-05-23 DIAGNOSIS — I10 Essential (primary) hypertension: Secondary | ICD-10-CM

## 2023-05-23 DIAGNOSIS — E119 Type 2 diabetes mellitus without complications: Secondary | ICD-10-CM

## 2023-05-28 ENCOUNTER — Ambulatory Visit: Payer: Commercial Managed Care - PPO | Admitting: Family

## 2023-06-01 ENCOUNTER — Ambulatory Visit: Payer: Commercial Managed Care - PPO | Admitting: Family

## 2023-06-01 VITALS — BP 130/82 | HR 82 | Temp 98.3°F | Ht 66.0 in | Wt 225.4 lb

## 2023-06-01 DIAGNOSIS — Z0000 Encounter for general adult medical examination without abnormal findings: Secondary | ICD-10-CM

## 2023-06-01 DIAGNOSIS — Z7985 Long-term (current) use of injectable non-insulin antidiabetic drugs: Secondary | ICD-10-CM

## 2023-06-01 DIAGNOSIS — I10 Essential (primary) hypertension: Secondary | ICD-10-CM

## 2023-06-01 DIAGNOSIS — R7309 Other abnormal glucose: Secondary | ICD-10-CM

## 2023-06-01 DIAGNOSIS — E119 Type 2 diabetes mellitus without complications: Secondary | ICD-10-CM

## 2023-06-01 DIAGNOSIS — J4520 Mild intermittent asthma, uncomplicated: Secondary | ICD-10-CM

## 2023-06-01 DIAGNOSIS — Z7984 Long term (current) use of oral hypoglycemic drugs: Secondary | ICD-10-CM | POA: Diagnosis not present

## 2023-06-01 LAB — POCT GLYCOSYLATED HEMOGLOBIN (HGB A1C): Hemoglobin A1C: 6.8 % — AB (ref 4.0–5.6)

## 2023-06-01 MED ORDER — MONTELUKAST SODIUM 10 MG PO TABS: 10.0000 mg | ORAL_TABLET | Freq: Every day | ORAL | 3 refills | Status: DC

## 2023-06-01 NOTE — Assessment & Plan Note (Signed)
Chronic,stable. Continue losartan to 75 mg daily total

## 2023-06-01 NOTE — Assessment & Plan Note (Addendum)
Lab Results  Component Value Date   HGBA1C 6.8 (A) 06/01/2023   Improved.  Continue Mounjaro 7.5 mg, metformin 2000 mg every day.

## 2023-06-01 NOTE — Progress Notes (Signed)
Assessment & Plan:  Elevated glucose -     POCT glycosylated hemoglobin (Hb A1C)  Mild intermittent extrinsic asthma without complication -     Montelukast Sodium; Take 1 tablet (10 mg total) by mouth at bedtime.  Dispense: 90 tablet; Refill: 3  Routine physical examination -     Montelukast Sodium; Take 1 tablet (10 mg total) by mouth at bedtime.  Dispense: 90 tablet; Refill: 3  Hypertension, unspecified type Assessment & Plan: Chronic,stable. Continue losartan to 75 mg daily total   Diabetes mellitus without complication [facility]) Assessment & Plan: Lab Results  Component Value Date   HGBA1C 6.8 (A) 06/01/2023   Improved.  Continue Mounjaro 7.5 mg, metformin 2000 mg every day.       Return precautions given.   Risks, benefits, and alternatives of the medications and treatment plan prescribed today were discussed, and patient expressed understanding.   Education regarding symptom management and diagnosis given to patient on AVS either electronically or printed.  Return in about 3 months (around 09/01/2023).  Rennie Plowman, FNP  Subjective:    [name], female    DOB: 02-21-1996, 27 y.o.   MRN: [id number]  CC: Amy Olson is a 27 y.o. female who presents today for follow up.   HPI: Feels well today.  No new complaints.  She is tolerating Mounjaro.    Allergies: Augmentin [amoxicillin-pot clavulanate] Current Outpatient Medications on File Prior to Visit  Medication Sig Dispense Refill   albuterol (PROAIR HFA) 108 (90 Base) MCG/ACT inhaler Inhale 2 puffs into the lungs every 6 (six) hours as needed for wheezing or shortness of breath. 18 g 1   EPINEPHrine 0.3 mg/0.3 mL IJ SOAJ injection epinephrine 0.3 mg/0.3 mL injection, auto-injector  INJECT INTRAMUSCULARLY AS DIRECTED     escitalopram (LEXAPRO) 10 MG tablet TAKE 1 TABLET(10 MG) BY MOUTH DAILY 90 tablet 3   levocetirizine (XYZAL) 5 MG tablet Take 5 mg by mouth every evening.      Levonorgestrel-Ethinyl Estradiol (SIMPESSE) 0.15-0.03 &0.01 MG tablet TAKE 1 TABLET BY MOUTH DAILY 91 tablet 0   losartan (COZAAR) 25 MG tablet Take 1 tablet (25 mg total) by mouth daily. 90 tablet 3   losartan (COZAAR) 50 MG tablet TAKE 1 TABLET(50 MG) BY MOUTH DAILY 90 tablet 0   metFORMIN (GLUCOPHAGE) 500 MG tablet Take 2 tablets (1,000 mg total) by mouth 2 (two) times daily with a meal. 360 tablet 3   QVAR 80 MCG/ACT inhaler TAKE 2 PUFFS FIRST THING IN THE MORNING AND THEN  ANOTHER 2 PUFFS ABOUT 12  HOURS LATER. 3 Inhaler 0   tirzepatide (MOUNJARO) 7.5 MG/0.5ML Pen Inject 7.5 mg into the skin once a week. 6 mL 2   No current facility-administered medications on file prior to visit.    Review of Systems  Constitutional:  Negative for chills and fever.  Respiratory:  Negative for cough.   Cardiovascular:  Negative for chest pain and palpitations.  Gastrointestinal:  Negative for nausea and vomiting.      Objective:    BP 130/82   Pulse 82   Temp 98.3 F (36.8 C) (Oral)   Ht 5\' 6"  (1.676 m)   Wt 225 lb 6.4 oz (102.2 kg)   LMP  (LMP Unknown)   SpO2 97%   BMI 36.38 kg/m  BP Readings from Last 3 Encounters:  06/01/23 130/82  01/24/23 136/88  12/19/22 138/88   Wt Readings from Last 3 Encounters:  06/01/23 225 lb 6.4  oz (102.2 kg)  01/24/23 230 lb 3.2 oz (104.4 kg)  12/19/22 232 lb 12.8 oz (105.6 kg)    Physical Exam Vitals reviewed.  Constitutional:      Appearance: She is well-developed.  Eyes:     Conjunctiva/sclera: Conjunctivae normal.  Cardiovascular:     Rate and Rhythm: Normal rate and regular rhythm.     Pulses: Normal pulses.     Heart sounds: Normal heart sounds.  Pulmonary:     Effort: Pulmonary effort is normal.     Breath sounds: Normal breath sounds. No wheezing, rhonchi or rales.  Skin:    General: Skin is warm and dry.  Neurological:     Mental Status: She is alert.  Psychiatric:        Speech: Speech normal.        Behavior: Behavior normal.         Thought Content: Thought content normal.

## 2023-07-12 ENCOUNTER — Encounter: Payer: Self-pay | Admitting: Family

## 2023-07-12 DIAGNOSIS — I10 Essential (primary) hypertension: Secondary | ICD-10-CM

## 2023-07-13 MED ORDER — LOSARTAN POTASSIUM 25 MG PO TABS
25.0000 mg | ORAL_TABLET | Freq: Every day | ORAL | 3 refills | Status: DC
Start: 2023-07-13 — End: 2024-02-27

## 2023-07-25 ENCOUNTER — Emergency Department: Payer: Commercial Managed Care - PPO

## 2023-07-25 ENCOUNTER — Other Ambulatory Visit: Payer: Self-pay

## 2023-07-25 ENCOUNTER — Encounter: Payer: Self-pay | Admitting: Emergency Medicine

## 2023-07-25 ENCOUNTER — Encounter: Payer: Self-pay | Admitting: Family

## 2023-07-25 ENCOUNTER — Emergency Department
Admission: EM | Admit: 2023-07-25 | Discharge: 2023-07-25 | Disposition: A | Discharge status: Home / Self Care | Payer: Commercial Managed Care - PPO | Attending: Emergency Medicine | Admitting: Emergency Medicine

## 2023-07-25 DIAGNOSIS — J45909 Unspecified asthma, uncomplicated: Secondary | ICD-10-CM | POA: Insufficient documentation

## 2023-07-25 DIAGNOSIS — R0789 Other chest pain: Secondary | ICD-10-CM | POA: Insufficient documentation

## 2023-07-25 DIAGNOSIS — R079 Chest pain, unspecified: Secondary | ICD-10-CM

## 2023-07-25 LAB — CBC
HCT: 40.6 % (ref 36.0–46.0)
Hemoglobin: 13 g/dL (ref 12.0–15.0)
MCH: 28.1 pg (ref 26.0–34.0)
MCHC: 32 g/dL (ref 30.0–36.0)
MCV: 87.9 fL (ref 80.0–100.0)
Platelets: 379 10*3/uL (ref 150–400)
RBC: 4.62 MIL/uL (ref 3.87–5.11)
RDW: 14.6 % (ref 11.5–15.5)
WBC: 8 10*3/uL (ref 4.0–10.5)
nRBC: 0 % (ref 0.0–0.2)

## 2023-07-25 LAB — BASIC METABOLIC PANEL
Anion gap: 11 (ref 5–15)
BUN: 15 mg/dL (ref 6–20)
CO2: 22 mmol/L (ref 22–32)
Calcium: 9.2 mg/dL (ref 8.9–10.3)
Chloride: 102 mmol/L (ref 98–111)
Creatinine, Ser: 0.8 mg/dL (ref 0.44–1.00)
GFR, Estimated: >60 mL/min (ref >60)
Glucose, Bld: 136 mg/dL — ABNORMAL HIGH (ref 70–99)
Potassium: 4 mmol/L (ref 3.5–5.1)
Sodium: 135 mmol/L (ref 135–145)

## 2023-07-25 LAB — TROPONIN I (HIGH SENSITIVITY): Troponin I (High Sensitivity): <2 ng/L (ref <18)

## 2023-07-25 LAB — D-DIMER, QUANTITATIVE: D-Dimer, Quant: 0.41 ug{FEU}/mL (ref 0.00–0.50)

## 2023-07-25 NOTE — ED Triage Notes (Addendum)
Pt in with intermittent central cp that began at 0915 this morning while walking into work. Pain radiates to L shoulder. +nausea, and pt states it is hard to breathe during episodes of pain. Hx asthma, takes [facility] daily

## 2023-07-25 NOTE — ED Provider Notes (Signed)
[facility] Provider Note    Event Date/Time   First MD Initiated Contact with Patient 07/25/23 1307     (approximate)   History   Chest Pain   HPI  Amy Olson is a 27 year old female presenting to the emergency department for evaluation of chest pain.  2 weeks ago, patient was walking when she had an episode of pressure over the left side of her chest that lasted for a few minutes.  She had another episode earlier today with ongoing mild chest discomfort.  Does feel that the pain is worse with deep breaths.  Has a history of asthma but does not regularly require an inhaler.  Is on oral contraceptives.      Physical Exam   Triage Vital Signs: ED Triage Vitals  Encounter Vitals Group     BP 07/25/23 1215 (!) 142/96     Systolic BP Percentile --      Diastolic BP Percentile --      Pulse Rate 07/25/23 1215 94     Resp 07/25/23 1215 20     Temp 07/25/23 1215 98.1 F (36.7 C)     Temp Source 07/25/23 1215 Oral     SpO2 07/25/23 1215 100 %     Weight 07/25/23 1214 220 lb (99.8 kg)     Height --      Head Circumference --      Peak Flow --      Pain Score 07/25/23 1214 8     Pain Loc --      Pain Education --      Exclude from Growth Chart --     Most recent vital signs: Vitals:   07/25/23 1215  BP: (!) 142/96  Pulse: 94  Resp: 20  Temp: 98.1 F (36.7 C)  SpO2: 100%     General: Awake, interactive  CV:  Regular rate, good peripheral perfusion.  Chest wall: No significant tenderness palpation Resp:  Lungs clear, unlabored respirations.  Abd:  Soft, nondistended.  Neuro:  Symmetric facial movement, fluid speech   ED Results / Procedures / Treatments   Labs (all labs ordered are listed, but only abnormal results are displayed) Labs Reviewed  BASIC METABOLIC PANEL - Abnormal; Notable for the following components:      Result Value   Glucose, Bld 136 (*)    All other components within normal limits  CBC  D-DIMER, QUANTITATIVE  (NOT AT [facility])  POC URINE PREG, ED  TROPONIN I (HIGH SENSITIVITY)     EKG EKG independently reviewed interpreted by myself (ER attending) demonstrates:  EKG demonstrates normal sinus rhythm at a rate of 80, PR 154, QRS 88, QTc 433, no acute ST changes  RADIOLOGY Imaging independently reviewed and interpreted by myself demonstrates:  CXR without focal consolidation  PROCEDURES:  Critical Care performed: No  Procedures   MEDICATIONS ORDERED IN ED: Medications - No data to display   IMPRESSION / MDM / ASSESSMENT AND PLAN / ED COURSE  I reviewed the triage vital signs and the nursing notes.  Differential diagnosis includes, but is not limited to, pneumonia, PE, pneumothorax, low suspicion ACS, musculoskeletal strain, pleurisy  Patient's presentation is most consistent with acute presentation with potential threat to life or bodily function.  27 year old female presenting to the emergency department for evaluation of chest pain.  Vital signs stable on presentation.  Workup reassuring including negative D-dimer, reassuring EKG and x-Rolonda Pontarelli.  Patient with minimal symptoms on reevaluation.  History of  asthma but no wheezing on exam here.  She is comfortable with discharge home and outpatient follow-up which I do think is reasonable.  Strict return precautions.  Patient discharged in stable condition.  Clinical Course as of 07/25/23 1516  Wed Jul 25, 2023  1437 D-dimer, quantitative [HD]    Clinical Course User Index [HD] Lynda Rainwater, Student-PA     FINAL CLINICAL IMPRESSION(S) / ED DIAGNOSES   Final diagnoses:  Nonspecific chest pain     Rx / DC Orders   ED Discharge Orders     None        Note:  This document was prepared using Dragon voice recognition software and may include unintentional dictation errors.   Trinna Post, MD 07/25/23 [id number]

## 2023-07-25 NOTE — Discharge Instructions (Signed)
You were seen in the Emergency Department today for evaluation of your chest pain. Fortunately, your labs, EKG, and chest x- were overall reassuring against a emergency cause for your pain. Please follow-up with your primary doctor within the next few days for reevaluation. You can take Tylenol and ibuprofen as needed for your pain, unless there is another reason that you should not take these. Return to the ER for any new or worsening symptoms including worsening chest pain, difficulty breathing, or any other new or concerning symptoms that you believe warrants immediate attention.   

## 2023-07-25 NOTE — Telephone Encounter (Signed)
Spoke to pt and encouraged her to go to ED pt stated that she would go because we did not have an appts in office that I could offer her

## 2023-07-25 NOTE — ED Notes (Signed)
This RN called lab to ask about blue top. Lab will run d-dimer.

## 2023-07-27 ENCOUNTER — Telehealth: Payer: Self-pay

## 2023-07-27 NOTE — Transitions of Care (Post Inpatient/ED Visit) (Unsigned)
I spoke with pt;  pt seen [facility] ED on 07/25/23 with mid to left CP that radiated into lt side of neck and arm and also H/A on lt side of head. pt said she was not given reason for pain but pain went away while at ED and no pain since then no SOB. Pt already has appt with Phoebe Perch FNP on 09/03/23. UC & ED precautions given and pt voiced understanding., sending note to Phoebe Perch FNP.      07/27/2023  Name: Amy Olson MRN: [id number] DOB: 1996-08-12  Today's TOC FU Call Status: Today's TOC FU Call Status:: Successful TOC FU Call Completed TOC FU Call Complete Date: 07/27/23 Patient's Name and Date of Birth confirmed.  Transition Care Management Follow-up Telephone Call Date of Discharge: 07/25/23 Discharge Facility: [facility]) Type of Discharge: Emergency Department Reason for ED Visit: Other: (pt seen [facility] ED on 07/25/23  with mid to left CP that radaiated into lt side of neck and arm and also H/A on lt side of head. pt said she was not given reason for pain but pain went away while at ED and no pain since then no SOB.) How have you been since you were released from the hospital?: Better Any questions or concerns?: No  Items Reviewed: Did you receive and understand the discharge instructions provided?: Yes Medications obtained,verified, and reconciled?: Yes (Medications Reviewed) Any new allergies since your discharge?: No Dietary orders reviewed?: NA Do you have support at home?: Yes People in Home: parent(s) Name of Support/Comfort Primary Source: wendy  Medications Reviewed Today: Medications Reviewed Today   Medications were not reviewed in this encounter     Home Care and Equipment/Supplies: Were Home Health Services Ordered?: NA Any new equipment or medical supplies ordered?: NA  Functional Questionnaire: Do you need assistance with bathing/showering or dressing?: No Do you need assistance with meal preparation?: No Do you need assistance  with eating?: No Do you have difficulty maintaining continence: No Do you need assistance with getting out of bed/getting out of a chair/moving?: No Do you have difficulty managing or taking your medications?: No  Follow up appointments reviewed: PCP Follow-up appointment confirmed?: Yes Date of PCP follow-up appointment?: 08/03/23 (pt said she may have to change that appt but pt will change  if needed.) Follow-up Provider: Phoebe Perch FNP Specialist [facility] Follow-up appointment confirmed?: NA Do you need transportation to your follow-up appointment?: No Do you understand care options if your condition(s) worsen?: Yes-patient verbalized understanding    SIGNATURE Lewanda Rife, LPN

## 2023-07-30 NOTE — Telephone Encounter (Signed)
noted 

## 2023-08-23 ENCOUNTER — Other Ambulatory Visit: Payer: Self-pay

## 2023-08-23 DIAGNOSIS — Z01419 Encounter for gynecological examination (general) (routine) without abnormal findings: Secondary | ICD-10-CM

## 2023-08-24 ENCOUNTER — Other Ambulatory Visit: Payer: Self-pay | Admitting: Family

## 2023-08-24 DIAGNOSIS — E119 Type 2 diabetes mellitus without complications: Secondary | ICD-10-CM

## 2023-08-24 MED ORDER — LOSARTAN POTASSIUM 50 MG PO TABS: ORAL_TABLET | ORAL | 1 refills | Status: DC

## 2023-08-27 ENCOUNTER — Ambulatory Visit: Payer: Commercial Managed Care - PPO | Admitting: Family

## 2023-08-28 ENCOUNTER — Ambulatory Visit: Payer: Commercial Managed Care - PPO | Admitting: Family

## 2023-08-30 ENCOUNTER — Encounter: Payer: Self-pay | Admitting: Obstetrics and Gynecology

## 2023-08-30 ENCOUNTER — Ambulatory Visit: Payer: Commercial Managed Care - PPO | Admitting: Obstetrics and Gynecology

## 2023-08-30 VITALS — BP 124/88 | HR 80 | Ht 66.0 in | Wt 235.3 lb

## 2023-08-30 DIAGNOSIS — Z01419 Encounter for gynecological examination (general) (routine) without abnormal findings: Secondary | ICD-10-CM

## 2023-08-30 MED ORDER — LEVONORGEST-ETH ESTRAD 91-DAY 0.15-0.03 &0.01 MG PO TABS: 1.0000 | ORAL_TABLET | Freq: Every day | ORAL | 3 refills | Status: DC

## 2023-08-30 NOTE — Progress Notes (Signed)
 Patients presents for annual exam today. She states doing well with current OCP, would like to continue. Up to date on pap smear. She states no other questions or concerns at this time.

## 2023-08-30 NOTE — Progress Notes (Signed)
HPI:      [name] is a 27 y.o. G0P0000 who LMP was Patient's last menstrual period was 07/08/2023 (approximate).  Subjective:   She presents today for her annual examination.  She has no complaints.  She is using Seasonique OCPs without problem.  She is not missing pills and is having menstrual periods at the correct time and the packs.  She would like to continue them.    Hx: The following portions of the patient's history were reviewed and updated as appropriate:             She  has a past medical history of allergies, Allergy, Asthma, Depression, Diabetes mellitus without complication (HCC), and Hypertension. She does not have any pertinent problems on file. She  has a past surgical history that includes Adenoidectomy. Her family history includes Arthritis in her maternal grandmother; Colon cancer (age of onset: 54) in her paternal grandmother; Cystic kidney disease in her maternal aunt and maternal grandfather; Dementia in her paternal great-grandmother; Diabetes in her brother; Hyperlipidemia in her maternal grandfather, maternal grandmother, and mother; Hypertension in her father, maternal grandfather, maternal grandmother, and paternal grandfather; Memory loss in her maternal grandmother; Rheum arthritis in her maternal grandmother. She  reports that she has never smoked. She has never used smokeless tobacco. She reports current alcohol use of about 1.0 - 2.0 standard drink of alcohol per week. She reports that she does not use drugs. She has a current medication list which includes the following prescription(s): albuterol, epinephrine, escitalopram, levocetirizine, losartan, losartan, montelukast, qvar, tirzepatide, levonorgestrel-ethinyl estradiol, and metformin. She is allergic to augmentin [amoxicillin-pot clavulanate].       Review of Systems:  Review of Systems  Constitutional: Denied constitutional symptoms, night sweats, recent illness, fatigue, fever, insomnia and weight  loss.  Eyes: Denied eye symptoms, eye pain, photophobia, vision change and visual disturbance.  Ears/Nose/Throat/Neck: Denied ear, nose, throat or neck symptoms, hearing loss, nasal discharge, sinus congestion and sore throat.  Cardiovascular: Denied cardiovascular symptoms, arrhythmia, chest pain/pressure, edema, exercise intolerance, orthopnea and palpitations.  Respiratory: Denied pulmonary symptoms, asthma, pleuritic pain, productive sputum, cough, dyspnea and wheezing.  Gastrointestinal: Denied, gastro-esophageal reflux, melena, nausea and vomiting.  Genitourinary: Denied genitourinary symptoms including symptomatic vaginal discharge, pelvic relaxation issues, and urinary complaints.  Musculoskeletal: Denied musculoskeletal symptoms, stiffness, swelling, muscle weakness and myalgia.  Dermatologic: Denied dermatology symptoms, rash and scar.  Neurologic: Denied neurology symptoms, dizziness, headache, neck pain and syncope.  Psychiatric: Denied psychiatric symptoms, anxiety and depression.  Endocrine: Denied endocrine symptoms including hot flashes and night sweats.   Meds:   Current Outpatient Medications on File Prior to Visit  Medication Sig Dispense Refill   albuterol (PROAIR HFA) 108 (90 Base) MCG/ACT inhaler Inhale 2 puffs into the lungs every 6 (six) hours as needed for wheezing or shortness of breath. 18 g 1   EPINEPHrine 0.3 mg/0.3 mL IJ SOAJ injection epinephrine 0.3 mg/0.3 mL injection, auto-injector  INJECT INTRAMUSCULARLY AS DIRECTED     escitalopram (LEXAPRO) 10 MG tablet TAKE 1 TABLET(10 MG) BY MOUTH DAILY 90 tablet 3   levocetirizine (XYZAL) 5 MG tablet Take 5 mg by mouth every evening.     losartan (COZAAR) 25 MG tablet Take 1 tablet (25 mg total) by mouth daily. 90 tablet 3   losartan (COZAAR) 50 MG tablet TAKE 1 TABLET(50 MG) BY MOUTH DAILY 90 tablet 1   montelukast (SINGULAIR) 10 MG tablet Take 1 tablet (10 mg total) by mouth at bedtime. 90 tablet  3   QVAR 80 MCG/ACT  inhaler TAKE 2 PUFFS FIRST THING IN THE MORNING AND THEN  ANOTHER 2 PUFFS ABOUT 12  HOURS LATER. 3 Inhaler 0   tirzepatide (MOUNJARO) 7.5 MG/0.5ML Pen Inject 7.5 mg into the skin once a week. 6 mL 2   metFORMIN (GLUCOPHAGE) 500 MG tablet Take 2 tablets (1,000 mg total) by mouth 2 (two) times daily with a meal. 360 tablet 3   No current facility-administered medications on file prior to visit.     Objective:     Vitals:   08/30/23 0826  BP: 124/88  Pulse: 80    Filed Weights   08/30/23 0826  Weight: 235 lb 4.8 oz (106.7 kg)              Physical examination General NAD, Conversant  HEENT Atraumatic; Op clear with mmm.  Normo-cephalic.  Anicteric sclerae  Thyroid/Neck Smooth without nodularity or enlargement. Normal ROM.  Neck Supple.  Skin No rashes, lesions or ulceration. Normal palpated skin turgor. No nodularity.  Breasts: No masses or discharge.  Symmetric.  No axillary adenopathy.  Lungs: Clear to auscultation.No rales or wheezes. Normal Respiratory effort, no retractions.  Heart: NSR.  No murmurs or rubs appreciated. No peripheral edema  Abdomen: Soft.  Non-tender.  No masses.  No HSM. No hernia  Extremities: Moves all appropriately.  Normal ROM for age. No lymphadenopathy.  Neuro: Oriented to PPT.  Normal mood. Normal affect.     Pelvic: Deferred by patient    Assessment:    G0P0000 Patient Active Problem List   Diagnosis Date Noted   Headache 12/19/2022   HTN (hypertension) 01/24/2021   Breakthrough bleeding on OCPs 05/03/2020   Elevated blood pressure reading 05/03/2020   Open wound of finger 01/26/2020   Diabetes mellitus without complication (HCC) 07/04/2016   Depression 12/04/2015   Asthma 09/15/2013     1. Well woman exam with routine gynecological exam     Normal exam -OCPs without problem   Plan:            1.  Basic Screening Recommendations The basic screening recommendations for asymptomatic women were discussed with the patient during her  visit.  The age-appropriate recommendations were discussed with her and the rational for the tests reviewed.  When I am informed by the patient that another primary care physician has previously obtained the age-appropriate tests and they are up-to-date, only outstanding tests are ordered and referrals given as necessary.  Abnormal results of tests will be discussed with her when all of her results are completed.  Routine preventative health maintenance measures emphasized: Exercise/Diet/Weight control, Tobacco Warnings, Alcohol/Substance use risks and Stress Management 2.  Continue OCPs Orders No orders of the defined types were placed in this encounter.    Meds ordered this encounter  Medications   Levonorgestrel-Ethinyl Estradiol (SIMPESSE) 0.15-0.03 &0.01 MG tablet    Sig: Take 1 tablet by mouth daily.    Dispense:  91 tablet    Refill:  3           F/U  Return in about 1 year (around 08/29/2024) for Annual Physical.  Elonda Husky, M.D. 08/30/2023 8:39 AM

## 2023-09-03 ENCOUNTER — Ambulatory Visit: Payer: Commercial Managed Care - PPO | Admitting: Family

## 2023-09-05 ENCOUNTER — Ambulatory Visit: Payer: Commercial Managed Care - PPO | Admitting: Family

## 2024-01-04 ENCOUNTER — Other Ambulatory Visit (HOSPITAL_COMMUNITY): Payer: Self-pay

## 2024-01-04 ENCOUNTER — Telehealth: Payer: Self-pay | Admitting: Pharmacy Technician

## 2024-01-04 NOTE — Telephone Encounter (Signed)
 LVM to inform pt of the following. Please relay when pt calls back   Received notification from CoverMyMeds that prior authorization for [facility] 7.5MG  is required/requested.   Insurance verification completed.   The patient is insured through [facility] .   Per test claim: [facility] Submitted and pending

## 2024-01-04 NOTE — Telephone Encounter (Signed)
 Pharmacy Patient Advocate Encounter   Received notification from CoverMyMeds that prior authorization for [facility] 7.5MG  is required/requested.   Insurance verification completed.   The patient is insured through [facility] .   Per test claim: [facility] Submitted and pending

## 2024-01-04 NOTE — Telephone Encounter (Signed)
 Pharmacy Patient Advocate Encounter  Received notification from [facility] that Prior Authorization for [facility] 7.5MG  has been APPROVED from 01/04/24 to 01/03/25. Unable to obtain price due to refill too soon rejection, last fill date 12/21/23 next available fill date03/21/25   PA #/Case ID/Reference #: [id number]

## 2024-01-07 NOTE — Telephone Encounter (Signed)
 Pt has been notified.

## 2024-01-28 ENCOUNTER — Other Ambulatory Visit: Payer: Self-pay | Admitting: Family

## 2024-01-28 DIAGNOSIS — E119 Type 2 diabetes mellitus without complications: Secondary | ICD-10-CM

## 2024-02-20 ENCOUNTER — Other Ambulatory Visit: Payer: Self-pay | Admitting: Family

## 2024-02-20 DIAGNOSIS — E119 Type 2 diabetes mellitus without complications: Secondary | ICD-10-CM

## 2024-02-27 ENCOUNTER — Ambulatory Visit: Admitting: Family

## 2024-02-27 VITALS — BP 126/70 | HR 90 | Temp 97.9°F | Ht 67.0 in | Wt 233.4 lb

## 2024-02-27 DIAGNOSIS — F418 Other specified anxiety disorders: Secondary | ICD-10-CM | POA: Diagnosis not present

## 2024-02-27 DIAGNOSIS — E119 Type 2 diabetes mellitus without complications: Secondary | ICD-10-CM | POA: Diagnosis not present

## 2024-02-27 DIAGNOSIS — J4520 Mild intermittent asthma, uncomplicated: Secondary | ICD-10-CM | POA: Diagnosis not present

## 2024-02-27 DIAGNOSIS — I10 Essential (primary) hypertension: Secondary | ICD-10-CM

## 2024-02-27 DIAGNOSIS — R413 Other amnesia: Secondary | ICD-10-CM | POA: Diagnosis not present

## 2024-02-27 DIAGNOSIS — Z7985 Long-term (current) use of injectable non-insulin antidiabetic drugs: Secondary | ICD-10-CM

## 2024-02-27 DIAGNOSIS — E049 Nontoxic goiter, unspecified: Secondary | ICD-10-CM

## 2024-02-27 DIAGNOSIS — F32A Depression, unspecified: Secondary | ICD-10-CM

## 2024-02-27 LAB — COMPREHENSIVE METABOLIC PANEL WITH GFR
ALT: 24 U/L (ref 0–35)
AST: 33 U/L (ref 0–37)
Albumin: 4.2 g/dL (ref 3.5–5.2)
Alkaline Phosphatase: 52 U/L (ref 39–117)
BUN: 11 mg/dL (ref 6–23)
CO2: 23 meq/L (ref 19–32)
Calcium: 9.6 mg/dL (ref 8.4–10.5)
Chloride: 102 meq/L (ref 96–112)
Creatinine, Ser: 0.73 mg/dL (ref 0.40–1.20)
GFR: 110.96 mL/min (ref >60.00)
Glucose, Bld: 116 mg/dL — ABNORMAL HIGH (ref 70–99)
Potassium: 4.1 meq/L (ref 3.5–5.1)
Sodium: 134 meq/L — ABNORMAL LOW (ref 135–145)
Total Bilirubin: 0.4 mg/dL (ref 0.2–1.2)
Total Protein: 8 g/dL (ref 6.0–8.3)

## 2024-02-27 LAB — MICROALBUMIN / CREATININE URINE RATIO
Creatinine,U: 131.7 mg/dL
Microalb Creat Ratio: 10.5 mg/g (ref 0.0–30.0)
Microalb, Ur: 1.4 mg/dL (ref 0.0–1.9)

## 2024-02-27 LAB — HEMOGLOBIN A1C: Hgb A1c MFr Bld: 7.8 % — ABNORMAL HIGH (ref 4.6–6.5)

## 2024-02-27 MED ORDER — ESCITALOPRAM OXALATE 10 MG PO TABS: 10.0000 mg | ORAL_TABLET | Freq: Every day | ORAL | 3 refills | Status: AC

## 2024-02-27 MED ORDER — MONTELUKAST SODIUM 10 MG PO TABS
10.0000 mg | ORAL_TABLET | Freq: Every day | ORAL | 3 refills | Status: DC
Start: 2024-02-27 — End: 2024-07-01

## 2024-02-27 MED ORDER — LOSARTAN POTASSIUM 25 MG PO TABS: 25.0000 mg | ORAL_TABLET | Freq: Every day | ORAL | 3 refills | Status: DC

## 2024-02-27 MED ORDER — ALBUTEROL SULFATE HFA 108 (90 BASE) MCG/ACT IN AERS: 2.0000 | INHALATION_SPRAY | Freq: Four times a day (QID) | RESPIRATORY_TRACT | 1 refills | Status: AC | PRN

## 2024-02-27 MED ORDER — TIRZEPATIDE 10 MG/0.5ML ~~LOC~~ SOAJ
10.0000 mg | SUBCUTANEOUS | 3 refills | Status: DC
Start: 2024-02-27 — End: 2024-09-02

## 2024-02-27 MED ORDER — LOSARTAN POTASSIUM 50 MG PO TABS: ORAL_TABLET | ORAL | 3 refills | Status: AC

## 2024-02-27 NOTE — Assessment & Plan Note (Signed)
Chronic, stable  Continue Lexapro 10 mg qd

## 2024-02-27 NOTE — Progress Notes (Signed)
 Assessment & Plan:  Diabetes mellitus without complication (HCC) Assessment & Plan: Anticipate stable.  Pending A1c.  Increase Mounjaro  to 10 mg for additional benefit of weight loss.  Continue metformin  2000 mg daily  Orders: -     Comprehensive metabolic panel with GFR -     Hemoglobin A1c -     Microalbumin / creatinine urine ratio -     Losartan  Potassium; TAKE 1 TABLET(50 MG) BY MOUTH DAILY  Dispense: 90 tablet; Refill: 3 -     Tirzepatide ; Inject 10 mg into the skin once a week.  Dispense: 6 mL; Refill: 3  Memory loss -     Escitalopram  Oxalate; Take 1 tablet (10 mg total) by mouth daily.  Dispense: 90 tablet; Refill: 3  Depression with anxiety -     Escitalopram  Oxalate; Take 1 tablet (10 mg total) by mouth daily.  Dispense: 90 tablet; Refill: 3  Mild intermittent extrinsic asthma without complication -     Microalbumin / creatinine urine ratio -     Montelukast  Sodium; Take 1 tablet (10 mg total) by mouth at bedtime.  Dispense: 90 tablet; Refill: 3  Hypertension, unspecified type -     Losartan  Potassium; Take 1 tablet (25 mg total) by mouth daily.  Dispense: 90 tablet; Refill: 3  Palpable thyroid  -     US  THYROID ; Future  Mild intermittent asthma without complication Assessment & Plan: Excellent control.  She is no longer following with pulmonology, Dr. Waymond Hailey.  Refilled albuterol  for as needed use.  Continue Singulair , Xyzal   Depression, unspecified depression type Assessment & Plan: Chronic, stable.  Continue Lexapro  10 mg qd   Other orders -     Albuterol  Sulfate HFA; Inhale 2 puffs into the lungs every 6 (six) hours as needed for wheezing or shortness of breath.  Dispense: 18 g; Refill: 1     Return precautions given.   Risks, benefits, and alternatives of the medications and treatment plan prescribed today were discussed, and patient expressed understanding.   Education regarding symptom management and diagnosis given to patient on AVS either  electronically or printed.  Return in about 3 months (around 05/29/2024).  Bascom Bossier, FNP  Subjective:    [name], Amy Olson    DOB: 02-05-1996, 28 y.o.   MRN: [id number]  CC: Amy Olson is a 28 y.o. Amy Olson who presents today for follow up.   HPI: Feels well today.  No new complaints. She is doing well on Mounjaro  7.5 mg without nausea, constipation.  She does report her weight has plateaued and she is interested in increasing medication.  She remains compliant with metformin  2000 mg daily.    She has felt well on Lexapro  10 mg and feels dose is adequate   she has not taken Qvar  in years. Symptoms controlled singulair  and xyzal.       Allergies: Augmentin  [amoxicillin -pot clavulanate] Current Outpatient Medications on File Prior to Visit  Medication Sig Dispense Refill   levocetirizine (XYZAL) 5 MG tablet Take 5 mg by mouth every evening.     Levonorgestrel-Ethinyl Estradiol  (SIMPESSE) 0.15-0.03 &0.01 MG tablet Take 1 tablet by mouth daily. 91 tablet 3   metFORMIN  (GLUCOPHAGE ) 500 MG tablet TAKE 2 TABLETS(1000 MG) BY MOUTH TWICE DAILY WITH A MEAL 360 tablet 3   No current facility-administered medications on file prior to visit.    Review of Systems  Constitutional:  Negative for chills and fever.  Respiratory:  Negative for cough.  Cardiovascular:  Negative for chest pain and palpitations.  Gastrointestinal:  Negative for abdominal distention, constipation, nausea and vomiting.      Objective:    BP 126/70   Pulse 90   Temp 97.9 F (36.6 C) (Oral)   Ht 5\' 7"  (1.702 m)   Wt 233 lb 6.4 oz (105.9 kg)   SpO2 97%   BMI 36.56 kg/m  BP Readings from Last 3 Encounters:  02/27/24 126/70  08/30/23 124/88  07/25/23 (!) 142/96   Wt Readings from Last 3 Encounters:  02/27/24 233 lb 6.4 oz (105.9 kg)  08/30/23 235 lb 4.8 oz (106.7 kg)  07/25/23 220 lb (99.8 kg)    Physical Exam Vitals reviewed.  Constitutional:      Appearance: She is  well-developed.  Eyes:     Conjunctiva/sclera: Conjunctivae normal.  Neck:     Thyroid : Thyromegaly present. No thyroid  mass or thyroid  tenderness.  Cardiovascular:     Rate and Rhythm: Normal rate and regular rhythm.     Pulses: Normal pulses.     Heart sounds: Normal heart sounds.  Pulmonary:     Effort: Pulmonary effort is normal.     Breath sounds: Normal breath sounds. No wheezing, rhonchi or rales.  Skin:    General: Skin is warm and dry.  Neurological:     Mental Status: She is alert.  Psychiatric:        Speech: Speech normal.        Behavior: Behavior normal.        Thought Content: Thought content normal.

## 2024-02-27 NOTE — Patient Instructions (Signed)
 Increase Mounjaro  to 10 mg weekly.  Nice seeing you today

## 2024-02-27 NOTE — Assessment & Plan Note (Signed)
 Anticipate stable.  Pending A1c.  Increase Mounjaro  to 10 mg for additional benefit of weight loss.  Continue metformin  2000 mg daily

## 2024-02-27 NOTE — Assessment & Plan Note (Signed)
 Excellent control.  She is no longer following with pulmonology, Dr. Waymond Hailey.  Refilled albuterol  for as needed use.  Continue Singulair , Xyzal

## 2024-02-28 ENCOUNTER — Encounter: Payer: Self-pay | Admitting: Family

## 2024-03-04 ENCOUNTER — Ambulatory Visit
Admission: RE | Admit: 2024-03-04 | Discharge: 2024-03-04 | Disposition: A | Discharge status: Home / Self Care | Source: Ambulatory Visit | Attending: Family | Admitting: Family

## 2024-03-04 DIAGNOSIS — E049 Nontoxic goiter, unspecified: Secondary | ICD-10-CM | POA: Diagnosis present

## 2024-03-11 ENCOUNTER — Ambulatory Visit: Payer: Self-pay | Admitting: Family

## 2024-03-12 ENCOUNTER — Other Ambulatory Visit: Payer: Self-pay | Admitting: Family

## 2024-03-12 DIAGNOSIS — E049 Nontoxic goiter, unspecified: Secondary | ICD-10-CM | POA: Insufficient documentation

## 2024-03-31 NOTE — Telephone Encounter (Signed)
 Spoke to pt to inform her that we have reached out to our referral team

## 2024-04-04 ENCOUNTER — Telehealth: Payer: Self-pay

## 2024-04-04 NOTE — Telephone Encounter (Signed)
 Copied from CRM [id number]. Topic: Referral - Question >> Apr 04, 2024  2:14 PM Mesmerise C wrote: Reason for CRM: Timeka from [facility] received referral and was reviewed needing clarification on what endocrin is being addressed and send permanent records. Stated a message was already sent to Dr. Anola Basques and needing response back [phone]

## 2024-04-07 NOTE — Telephone Encounter (Signed)
 Please fax 03/04/24 US  thyroid  to [facility]  Referral in place 03/12/24  Please update pt that [facility] is asking for records  Please call [facility] endocrine and request appt  You may also give patient phone number to schedule. Referral is in place

## 2024-04-07 NOTE — Telephone Encounter (Signed)
 Spoke to [facility] @ [phone] Endocrinology and she stated that they needed more info,  US  Thyroid  results were sent over on 04/07/24 . Will let me know if anything else is needed, Spoke with pt informed her and gave her number to call as well

## 2024-04-07 NOTE — Telephone Encounter (Signed)
 Spoke to pt she was given number to reach out to them and US  Thyroid  was faxed to office as well

## 2024-04-08 ENCOUNTER — Other Ambulatory Visit: Payer: Self-pay | Admitting: Family

## 2024-04-08 DIAGNOSIS — E119 Type 2 diabetes mellitus without complications: Secondary | ICD-10-CM

## 2024-04-08 DIAGNOSIS — E049 Nontoxic goiter, unspecified: Secondary | ICD-10-CM

## 2024-04-18 NOTE — Telephone Encounter (Signed)
 LVM  @ [facility] in [place]  to call me back to inquire about pt referral that was sent  for pt on 6/17. Pt has been notified as well that I have reached out to them

## 2024-05-29 ENCOUNTER — Ambulatory Visit: Admitting: Family

## 2024-06-18 ENCOUNTER — Ambulatory Visit: Admitting: Family

## 2024-06-18 ENCOUNTER — Encounter: Payer: Self-pay | Admitting: Family

## 2024-06-18 ENCOUNTER — Ambulatory Visit: Payer: Self-pay | Admitting: Family

## 2024-06-18 VITALS — BP 130/70 | HR 78 | Temp 98.3°F | Ht 66.0 in | Wt 226.0 lb

## 2024-06-18 DIAGNOSIS — Z7985 Long-term (current) use of injectable non-insulin antidiabetic drugs: Secondary | ICD-10-CM

## 2024-06-18 DIAGNOSIS — E119 Type 2 diabetes mellitus without complications: Secondary | ICD-10-CM | POA: Diagnosis not present

## 2024-06-18 DIAGNOSIS — I10 Essential (primary) hypertension: Secondary | ICD-10-CM | POA: Diagnosis not present

## 2024-06-18 DIAGNOSIS — F32A Depression, unspecified: Secondary | ICD-10-CM

## 2024-06-18 LAB — POCT GLYCOSYLATED HEMOGLOBIN (HGB A1C): Hemoglobin A1C: 7 % — AB (ref 4.0–5.6)

## 2024-06-18 NOTE — Assessment & Plan Note (Signed)
 Chronic, stable. Continue Mounjaro  to 10 mg , metformin  2000 mg daily

## 2024-06-18 NOTE — Progress Notes (Signed)
 Assessment & Plan:  Diabetes mellitus without complication (HCC) Assessment & Plan: Chronic, stable. Continue Mounjaro  to 10 mg , metformin  2000 mg daily  Orders: -     POCT glycosylated hemoglobin (Hb A1C)  Depression, unspecified depression type Assessment & Plan: Chronic, stable.  Continue Lexapro  10 mg qd   Hypertension, unspecified type Assessment & Plan: Chronic,stable. Continue losartan  to 75 mg daily total      Return precautions given.   Risks, benefits, and alternatives of the medications and treatment plan prescribed today were discussed, and patient expressed understanding.   Education regarding symptom management and diagnosis given to patient on AVS either electronically or printed.  No follow-ups on file.  Rollene Northern, FNP  Subjective:    [name], female    DOB: 1996/04/12, 28 y.o.   MRN: [id number]  CC: Amy Olson is a 28 y.o. female who presents today for follow up.   HPI: Feels well today No new complaints She is doing well on mounjaro  10mg  and pleased with weight loss.  Denies constipation, nausea.     Endocrinology appointment scheduled 08/15/2024 for thyroid  nodule, goiter  Denies compressive symptoms   Allergies: Augmentin  [amoxicillin -pot clavulanate] Current Outpatient Medications on File Prior to Visit  Medication Sig Dispense Refill   albuterol  (PROAIR  HFA) 108 (90 Base) MCG/ACT inhaler Inhale 2 puffs into the lungs every 6 (six) hours as needed for wheezing or shortness of breath. 18 g 1   escitalopram  (LEXAPRO ) 10 MG tablet Take 1 tablet (10 mg total) by mouth daily. 90 tablet 3   levocetirizine (XYZAL) 5 MG tablet Take 5 mg by mouth every evening.     Levonorgestrel-Ethinyl Estradiol  (SIMPESSE) 0.15-0.03 &0.01 MG tablet Take 1 tablet by mouth daily. 91 tablet 3   losartan  (COZAAR ) 25 MG tablet Take 1 tablet (25 mg total) by mouth daily. 90 tablet 3   losartan  (COZAAR ) 50 MG tablet TAKE 1 TABLET(50 MG) BY  MOUTH DAILY 90 tablet 3   metFORMIN  (GLUCOPHAGE ) 500 MG tablet TAKE 2 TABLETS(1000 MG) BY MOUTH TWICE DAILY WITH A MEAL 360 tablet 3   montelukast  (SINGULAIR ) 10 MG tablet Take 1 tablet (10 mg total) by mouth at bedtime. 90 tablet 3   tirzepatide  (MOUNJARO ) 10 MG/0.5ML Pen Inject 10 mg into the skin once a week. 6 mL 3   No current facility-administered medications on file prior to visit.    Review of Systems  Constitutional:  Negative for chills and fever.  Respiratory:  Negative for cough.   Cardiovascular:  Negative for chest pain and palpitations.  Gastrointestinal:  Negative for constipation, nausea and vomiting.      Objective:    BP 130/70   Pulse 78   Temp 98.3 F (36.8 C) (Oral)   Ht 5' 6 (1.676 m)   Wt 226 lb (102.5 kg)   LMP  (LMP Unknown)   SpO2 99%   BMI 36.48 kg/m  BP Readings from Last 3 Encounters:  06/18/24 130/70  02/27/24 126/70  08/30/23 124/88   Wt Readings from Last 3 Encounters:  06/18/24 226 lb (102.5 kg)  02/27/24 233 lb 6.4 oz (105.9 kg)  08/30/23 235 lb 4.8 oz (106.7 kg)      02/27/2024   12:36 PM 02/27/2024   12:30 PM 06/01/2023    1:37 PM  Depression screen PHQ 2/9  Decreased Interest 0 0 0  Down, Depressed, Hopeless 0 0 0  PHQ - 2 Score 0 0 0  Altered sleeping  0  1  Tired, decreased energy 0  0  Change in appetite 0  0  Feeling bad or failure about yourself  0  0  Trouble concentrating 0  1  Moving slowly or fidgety/restless 0  0  Suicidal thoughts 0  0  PHQ-9 Score 0  2  Difficult doing work/chores Not difficult at all  Not difficult at all     Physical Exam Vitals reviewed.  Constitutional:      Appearance: She is well-developed.  Eyes:     Conjunctiva/sclera: Conjunctivae normal.  Neck:     Thyroid : Thyromegaly (diffuse) present. No thyroid  mass or thyroid  tenderness.  Cardiovascular:     Rate and Rhythm: Normal rate and regular rhythm.     Pulses: Normal pulses.     Heart sounds: Normal heart sounds.  Pulmonary:      Effort: Pulmonary effort is normal.     Breath sounds: Normal breath sounds. No wheezing, rhonchi or rales.  Skin:    General: Skin is warm and dry.  Neurological:     Mental Status: She is alert.  Psychiatric:        Speech: Speech normal.        Behavior: Behavior normal.        Thought Content: Thought content normal.

## 2024-06-18 NOTE — Assessment & Plan Note (Signed)
Chronic, stable  Continue Lexapro 10 mg qd

## 2024-06-18 NOTE — Assessment & Plan Note (Signed)
Chronic,stable. Continue losartan to 75 mg daily total

## 2024-06-30 ENCOUNTER — Ambulatory Visit: Admitting: Family

## 2024-07-01 ENCOUNTER — Other Ambulatory Visit: Payer: Self-pay | Admitting: Family

## 2024-07-01 DIAGNOSIS — J4520 Mild intermittent asthma, uncomplicated: Secondary | ICD-10-CM

## 2024-07-12 ENCOUNTER — Other Ambulatory Visit: Payer: Self-pay | Admitting: Family

## 2024-07-12 DIAGNOSIS — I10 Essential (primary) hypertension: Secondary | ICD-10-CM

## 2024-08-15 ENCOUNTER — Ambulatory Visit: Admitting: Endocrinology

## 2024-09-02 ENCOUNTER — Ambulatory Visit: Admitting: Endocrinology

## 2024-09-02 ENCOUNTER — Other Ambulatory Visit

## 2024-09-02 ENCOUNTER — Ambulatory Visit: Payer: Self-pay | Admitting: Endocrinology

## 2024-09-02 ENCOUNTER — Encounter: Payer: Self-pay | Admitting: Endocrinology

## 2024-09-02 VITALS — BP 122/84 | HR 83 | Ht 66.0 in | Wt 235.0 lb

## 2024-09-02 DIAGNOSIS — E1165 Type 2 diabetes mellitus with hyperglycemia: Secondary | ICD-10-CM

## 2024-09-02 DIAGNOSIS — Z7985 Long-term (current) use of injectable non-insulin antidiabetic drugs: Secondary | ICD-10-CM

## 2024-09-02 DIAGNOSIS — E042 Nontoxic multinodular goiter: Secondary | ICD-10-CM

## 2024-09-02 DIAGNOSIS — Z7984 Long term (current) use of oral hypoglycemic drugs: Secondary | ICD-10-CM

## 2024-09-02 LAB — POCT GLYCOSYLATED HEMOGLOBIN (HGB A1C): Hemoglobin A1C: 7.6 % — AB (ref 4.0–5.6)

## 2024-09-02 MED ORDER — TIRZEPATIDE 12.5 MG/0.5ML ~~LOC~~ SOAJ
12.5000 mg | SUBCUTANEOUS | 3 refills | Status: AC
Start: 2024-09-02 — End: ?

## 2024-09-02 NOTE — Progress Notes (Signed)
 Outpatient Endocrinology Note Amy Flansburg, MD   Patient's Name: Amy Olson    DOB: 28-Mar-1996    MRN: [id number]                                                    REASON OF VISIT: New consult for type 2 diabetes mellitus / thyroid  nodules  REFERRING PROVIDER: Dineen Rollene MATSU, FNP  PCP: Dineen Rollene MATSU, FNP  HISTORY OF PRESENT ILLNESS:   Amy Olson is a 28 y.o. old female with past medical history listed below, is here for new consult for type 2 diabetes mellitus /thyroid  nodules.  Pertinent Diabetes History: Patient is referred to endocrinology for evaluation and management of type 2 diabetes mellitus and multiple thyroid  nodules, initial consult on September 02, 2024.  Patient was diagnosed with type 2 diabetes mellitus in September 2017, with hemoglobin A1c was 6.5% at that time.  Patient has mildly uncontrolled type 2 diabetes mellitus with hemoglobin A1c mostly in the range of 7 to 7.8%.  History of DKA or diabetes related hospitalizations: none  Previous diabetes education: Yes   Family h/o diabetes mellitus: bother type 1 diabetes, aunt / cousin type 1 DM. Maternal grand father type 2 DM.    No personal history of pancreatitis and / or family history of medullary thyroid  carcinoma or MEN 2B syndrome.   Chronic Diabetes Complications : Retinopathy: no. Last ophthalmology exam was done on annually, following with ophthalmology regularly.  Nephropathy: no, on ACE/ARB / losartan  Peripheral neuropathy: no Coronary artery disease: no Stroke: no  Relevant comorbidities and cardiovascular risk factors: Obesity: no Body mass index is 37.93 kg/m.  Hypertension: Yes  Hyperlipidemia : Yes, on statin   Current / Home Diabetic regimen includes:  Mounjaro  10 mg weekly. Metformin  1000 mg two times a day.   Prior diabetic medications: Ozempic  upto 2mg  dose, not much effective, no side effect. Trulicity  switched to Ozempic . Mounjaro  started in 10/2022.  Glycemic data:    No glucose data to review, has not been checking blood sugar at home.  Hypoglycemia: Patient has denies hypoglycemic episodes. Patient has hypoglycemia awareness.  Factors modifying glucose control: 1.  Diabetic diet assessment: 3 meals a day.  2.  Staying active or exercising:   3.  Medication compliance: compliant all of the time.  # Multiple thyroid  nodules: Patient was noted to have enlarged thyroid  on physical exam and had ultrasound thyroid  in Mar 04, 2024, reviewed images.  Right complex thyroid  nodule measuring 1 cm and 1.2 cm.  Left small hypoechoic solid thyroid  nodule subcentimeter.  No family history of thyroid  cancer.  No radiation exposure to head and neck.  She is euthyroid not on thyroid  medication.  No neck compressive symptoms.  CLINICAL DATA:  Palpable abnormality.   EXAM: Mar 04, 2024 THYROID  ULTRASOUND   TECHNIQUE: Ultrasound examination of the thyroid  gland and adjacent soft tissues was performed.   COMPARISON:  None Available.   FINDINGS: Parenchymal Echotexture: Normal   Isthmus: 0.5 cm   Right lobe: 4.7 x 1.4 x 1.4 cm   Left lobe: 4.7 x 1.4 x 1.8 cm   _________________________________________________________   Estimated total number of nodules >/= 1 cm: 4   Number of spongiform nodules >/=  2 cm not described below (TR1): 0   Number of mixed cystic and solid nodules >/=  1.5 cm not described below (TR2): 0   _________________________________________________________   Nodule # 1: Mixed cystic and solid nodule with a hypoechoic solid component in the right upper gland measures only 1.0 cm. TI-RADS category 3. Given size (<1.4 cm) and appearance, this nodule does NOT meet TI-RADS criteria for biopsy or dedicated follow-up.   Nodule # 2: Mixed cystic and solid nodule in the right mid gland. The solid component is hypoechoic. Maximal size is 1.2 cm. TI-RADS category 3. *Given size (>/= 1.5 - 2.4 cm) and appearance, a follow-up ultrasound in  1 year should be considered based on TI-RADS criteria.   Nodule # 3: Small abutting hypoechoic solid thyroid  nodules in the left mid gland. Neither nodule measures larger than 0.9 cm. Given size (<0.9 cm) and appearance, this nodule does NOT meet TI-RADS criteria for biopsy or dedicated follow-up.   Additional tiny hypoechoic solid nodules are present in the left lower gland. These do not meet criteria for further evaluation.   IMPRESSION: 1. Mildly enlarged thyroid  gland containing multiple small thyroid  nodules most consistent with multinodular goiter. 2. No individual thyroid  nodule meets criteria to consider biopsy or dedicated imaging surveillance.   Interval history  Patient presented for evaluation and management of type 2 diabetes mellitus and multiple thyroid  nodules.  Ultrasound thyroid  images reviewed completed in May 2025.  Initial visit today.  REVIEW OF SYSTEMS As per history of present illness.   PAST MEDICAL HISTORY: Past Medical History:  Diagnosis Date   allergies    Allergy     Asthma    Depression    Diabetes mellitus without complication (HCC)    prediabetic   Hypertension     PAST SURGICAL HISTORY: Past Surgical History:  Procedure Laterality Date   ADENOIDECTOMY     1998- only adenoids    ALLERGIES: Allergies  Allergen Reactions   Augmentin  [Amoxicillin -Pot Clavulanate] Nausea Only    FAMILY HISTORY:  Family History  Problem Relation Age of Onset   Rheum arthritis Maternal Grandmother    Hyperlipidemia Maternal Grandmother    Hypertension Maternal Grandmother    Arthritis Maternal Grandmother    Memory loss Maternal Grandmother    Hyperlipidemia Mother    Hypertension Father    Diabetes Brother    Hyperlipidemia Maternal Grandfather    Hypertension Maternal Grandfather    Cystic kidney disease Maternal Grandfather        polycystic kidney disorder    Colon cancer Paternal Grandmother 63   Hypertension Paternal Grandfather     Dementia Paternal Great-grandmother    Cystic kidney disease Maternal Aunt    Breast cancer Neg Hx    Thyroid  cancer Neg Hx     SOCIAL HISTORY: Social History   Socioeconomic History   Marital status: Single    Spouse name: Not on file   Number of children: Not on file   Years of education: Not on file   Highest education level: Some college, no degree  Occupational History   Occupation: school  Tobacco Use   Smoking status: Never   Smokeless tobacco: Never  Vaping Use   Vaping status: Never Used  Substance and Sexual Activity   Alcohol use: Yes    Alcohol/week: 1.0 - 2.0 standard drink of alcohol    Types: 1 - 2 Standard drinks or equivalent per week    Comment: rare   Drug use: No   Sexual activity: Yes    Birth control/protection: Pill  Other Topics Concern   Not on file  Social History Narrative   Single   Works at [employer]- plans to go back to [employer].   Caffeine- no soda, 1 cup tea, rare coffee   Social Drivers of Corporate Investment Banker Strain: Low Risk  (02/27/2024)   Overall Financial Resource Strain (CARDIA)    Difficulty of Paying Living Expenses: Not very hard  Food Insecurity: No Food Insecurity (02/27/2024)   Hunger Vital Sign    Worried About Running Out of Food in the Last Year: Never true    Ran Out of Food in the Last Year: Never true  Transportation Needs: No Transportation Needs (02/27/2024)   PRAPARE - Administrator, Civil Service (Medical): No    Lack of Transportation (Non-Medical): No  Physical Activity: Unknown (02/27/2024)   Exercise Vital Sign    Days of Exercise per Week: 0 days    Minutes of Exercise per Session: Not on file  Stress: No Stress Concern Present (02/27/2024)   [employer] of Occupational Health - Occupational Stress Questionnaire    Feeling of Stress : Not at all  Social Connections: Moderately Integrated (02/27/2024)   Social Connection and Isolation Panel    Frequency of Communication with  Friends and Family: More than three times a week    Frequency of Social Gatherings with Friends and Family: Three times a week    Attends Religious Services: 1 to 4 times per year    Active Member of Clubs or Organizations: Yes    Attends Engineer, Structural: More than 4 times per year    Marital Status: Never married    MEDICATIONS:  Current Outpatient Medications  Medication Sig Dispense Refill   albuterol  (PROAIR  HFA) 108 (90 Base) MCG/ACT inhaler Inhale 2 puffs into the lungs every 6 (six) hours as needed for wheezing or shortness of breath. 18 g 1   escitalopram  (LEXAPRO ) 10 MG tablet Take 1 tablet (10 mg total) by mouth daily. 90 tablet 3   levocetirizine (XYZAL) 5 MG tablet Take 5 mg by mouth every evening.     Levonorgestrel-Ethinyl Estradiol  (SIMPESSE) 0.15-0.03 &0.01 MG tablet Take 1 tablet by mouth daily. 91 tablet 3   losartan  (COZAAR ) 25 MG tablet TAKE 1 TABLET(25 MG) BY MOUTH DAILY 90 tablet 3   losartan  (COZAAR ) 50 MG tablet TAKE 1 TABLET(50 MG) BY MOUTH DAILY 90 tablet 3   metFORMIN  (GLUCOPHAGE ) 500 MG tablet TAKE 2 TABLETS(1000 MG) BY MOUTH TWICE DAILY WITH A MEAL 360 tablet 3   montelukast  (SINGULAIR ) 10 MG tablet TAKE 1 TABLET(10 MG) BY MOUTH AT BEDTIME 90 tablet 0   tirzepatide  (MOUNJARO ) 12.5 MG/0.5ML Pen Inject 12.5 mg into the skin once a week. 6 mL 3   No current facility-administered medications for this visit.    PHYSICAL EXAM: Vitals:   09/02/24 1458  BP: 122/84  Pulse: 83  SpO2: 97%  Weight: 235 lb (106.6 kg)  Height: 5' 6 (1.676 m)   Body mass index is 37.93 kg/m.  Wt Readings from Last 3 Encounters:  09/02/24 235 lb (106.6 kg)  06/18/24 226 lb (102.5 kg)  02/27/24 233 lb 6.4 oz (105.9 kg)    General: Well developed, well nourished female in no apparent distress.  HEENT: AT/[place], no external lesions.  Eyes: Conjunctiva clear and no icterus. Neck: Neck supple  Lungs: Respirations not labored Neurologic: Alert, oriented, normal  speech Extremities / Skin: Dry.  Psychiatric: Does not appear depressed or anxious  Diabetic Foot Exam -  Simple   Simple Foot Form Diabetic Foot exam was performed with the following findings: Yes 09/02/2024  3:19 PM  Visual Inspection No deformities, no ulcerations, no other skin breakdown bilaterally: Yes Sensation Testing Intact to touch and monofilament testing bilaterally: Yes Pulse Check Posterior Tibialis and Dorsalis pulse intact bilaterally: Yes Comments Left great toe with callus.     LABS Reviewed Lab Results  Component Value Date   HGBA1C 7.6 (A) 09/02/2024   HGBA1C 7.0 (A) 06/18/2024   HGBA1C 7.8 (H) 02/27/2024   No results found for: FRUCTOSAMINE Lab Results  Component Value Date   CHOL 161 02/02/2022   HDL 37.50 (L) 02/02/2022   LDLDIRECT 86.0 02/02/2022   TRIG 344.0 (H) 02/02/2022   CHOLHDL 4 02/02/2022   Lab Results  Component Value Date   MICRALBCREAT 10.5 02/27/2024   MICRALBCREAT 6 01/21/2021   Lab Results  Component Value Date   CREATININE 0.73 02/27/2024   Lab Results  Component Value Date   GFR 110.96 02/27/2024    ASSESSMENT / PLAN  1. Uncontrolled type 2 diabetes mellitus with hyperglycemia (HCC)   2. Type 2 diabetes mellitus with hyperglycemia, without long-term current use of insulin (HCC)   3. Multiple thyroid  nodules     Diabetes Mellitus type 2, complicated by no known other complications. - Diabetic status / severity: Uncontrolled.  Lab Results  Component Value Date   HGBA1C 7.6 (A) 09/02/2024    - Hemoglobin A1c goal : <6.5%  Discussed optimal goal of controlled type 2 diabetes mellitus with hemoglobin A1c below 6.5%.  Discussed about type 2 diabetes mellitus and potential chronic complications including diabetic retinopathy, neuropathy and nephropathy.  Discussed about importance of controlling blood sugar and diabetes.  Discussed that is important to control and maintain diet, limit carbohydrate and portion  control.  Adjusted diabetes regimen as follows.  - Medications: See below.  I) increase Mounjaro  from 10 to 12.5 mg weekly.  Will maximize dose of Mounjaro  over time. II) continue metformin  1000 mg 2 times a day.  Patient is asked to call our clinic after a month if she feels comfortable, no side effects will increase the dose of Mounjaro  to 50 mg weekly in between the visits.  - Home glucose testing: At least check blood sugar in the morning fasting. - Discussed/ Gave Hypoglycemia treatment plan.  # Consult : not required at this time.  Patient has seen dietitian/diabetic educator in the past.  # Annual urine for microalbuminuria/ creatinine ratio, no microalbuminuria currently, continue ACE/ARB/losartan .  Will check today along with BMP. Last  Lab Results  Component Value Date   MICRALBCREAT 10.5 02/27/2024    # Foot check nightly.  # Annual dilated diabetic eye exams.  She had appointment in December.  - Diet: Make healthy diabetic food choices, discussed. - Life style / activity / exercise: Discussed.  2. Blood pressure  -  BP Readings from Last 1 Encounters:  09/02/24 122/84    - Control is in target.  - No change in current plans.  3. Lipid status / Hyperlipidemia - Last No results found for: LDLCALC - No indication of statin at this time.  Will check fasting lipid panel today.  # Multiple thyroid  nodules - Mildly complex thyroid  nodule measuring 1 and 1.2 cm on the right side no worrisome features.  Left subcentimeter thyroid  nodule, no no worrisome features.  Ultrasound thyroid  was completed in May 2025.   - No indication of needle biopsy. -Will continue to monitor these  nodules with serial ultrasound.  Will plan for ultrasound thyroid  around summer 2026. - Check thyroid  function test today.  Temari was seen today for new patient (initial visit).  Diagnoses and all orders for this visit:  Uncontrolled type 2 diabetes mellitus with hyperglycemia  (HCC)  Type 2 diabetes mellitus with hyperglycemia, without long-term current use of insulin (HCC) -     POCT glycosylated hemoglobin (Hb A1C) -     Basic metabolic panel with GFR -     Microalbumin / creatinine urine ratio -     Lipid panel -     tirzepatide  (MOUNJARO ) 12.5 MG/0.5ML Pen; Inject 12.5 mg into the skin once a week.  Multiple thyroid  nodules -     T4, free -     TSH   DISPOSITION Follow up in clinic in 3  months suggested.   All questions answered and patient verbalized understanding of the plan.  Tahmid Stonehocker, MD [facility] Endocrinology [facility] [address] [place], [address] [place], [place] [address] Phone # [phone]  At least part of this note was generated using voice recognition software. Inadvertent word errors may have occurred, which were not recognized during the proofreading process.

## 2024-09-03 LAB — BASIC METABOLIC PANEL WITH GFR
BUN: 14 mg/dL (ref 7–25)
CO2: 20 mmol/L (ref 20–32)
Calcium: 9.4 mg/dL (ref 8.6–10.2)
Chloride: 102 mmol/L (ref 98–110)
Creat: 0.7 mg/dL (ref 0.50–0.97)
Glucose, Bld: 232 mg/dL — ABNORMAL HIGH (ref 65–139)
Potassium: 4.5 mmol/L (ref 3.5–5.3)
Sodium: 135 mmol/L (ref 135–146)
eGFR: 119 mL/min/1.73m2 (ref >=60)

## 2024-09-03 LAB — LIPID PANEL
Cholesterol: 216 mg/dL — ABNORMAL HIGH (ref <200)
HDL: 49 mg/dL — ABNORMAL LOW (ref >=50)
Non-HDL Cholesterol (Calc): 167 mg/dL — ABNORMAL HIGH (ref <130)
Total CHOL/HDL Ratio: 4.4 (calc) (ref <5.0)
Triglycerides: 438 mg/dL — ABNORMAL HIGH (ref <150)

## 2024-09-03 LAB — MICROALBUMIN / CREATININE URINE RATIO
Creatinine, Urine: 52 mg/dL (ref 20–275)
Microalb Creat Ratio: 10 mg/g{creat} (ref <30)
Microalb, Ur: 0.5 mg/dL

## 2024-09-03 LAB — TSH: TSH: 1.86 m[IU]/L

## 2024-09-03 LAB — T4, FREE: Free T4: 1 ng/dL (ref 0.8–1.8)

## 2024-09-17 ENCOUNTER — Ambulatory Visit: Admitting: Endocrinology

## 2024-09-24 ENCOUNTER — Other Ambulatory Visit: Payer: Self-pay | Admitting: Obstetrics and Gynecology

## 2024-09-24 DIAGNOSIS — Z01419 Encounter for gynecological examination (general) (routine) without abnormal findings: Secondary | ICD-10-CM

## 2024-09-25 ENCOUNTER — Ambulatory Visit: Admitting: Family

## 2024-09-30 ENCOUNTER — Encounter: Payer: Self-pay | Admitting: Family

## 2024-09-30 DIAGNOSIS — Z01419 Encounter for gynecological examination (general) (routine) without abnormal findings: Secondary | ICD-10-CM

## 2024-09-30 NOTE — Telephone Encounter (Unsigned)
 Copied from CRM #[id number]. Topic: Clinical - Prescription Issue >> Sep 30, 2024  3:19 PM Frederich PARAS wrote: Reason for CRM: pt calling in to check status of her new script for birth control, pt needs a new script for birth control she is completely out

## 2024-10-01 MED ORDER — LEVONORGEST-ETH ESTRAD 91-DAY 0.15-0.03 &0.01 MG PO TABS: 1.0000 | ORAL_TABLET | Freq: Every day | ORAL | 3 refills | Status: AC

## 2024-10-01 NOTE — Telephone Encounter (Signed)
 LVM and sent my chart to inform pt that her appt in Jan has been changed to CPE per [facility]

## 2024-10-05 ENCOUNTER — Other Ambulatory Visit: Payer: Self-pay | Admitting: Family

## 2024-10-05 DIAGNOSIS — J4520 Mild intermittent asthma, uncomplicated: Secondary | ICD-10-CM

## 2024-10-08 LAB — OPHTHALMOLOGY REPORT-SCANNED

## 2024-11-03 ENCOUNTER — Other Ambulatory Visit (HOSPITAL_COMMUNITY)
Admission: RE | Admit: 2024-11-03 | Discharge: 2024-11-03 | Disposition: A | Discharge status: Home / Self Care | Source: Ambulatory Visit | Attending: Family | Admitting: Family

## 2024-11-03 ENCOUNTER — Ambulatory Visit: Admitting: Family

## 2024-11-03 VITALS — BP 130/76 | HR 87 | Temp 98.2°F | Ht 66.0 in | Wt 218.2 lb

## 2024-11-03 DIAGNOSIS — Z124 Encounter for screening for malignant neoplasm of cervix: Secondary | ICD-10-CM | POA: Diagnosis not present

## 2024-11-03 DIAGNOSIS — Z0000 Encounter for general adult medical examination without abnormal findings: Secondary | ICD-10-CM

## 2024-11-03 NOTE — Progress Notes (Signed)
 "  Assessment & Plan:  Annual physical exam Assessment & Plan: Clinical breast exam performed today.  Pap smear obtained.  Encouraged regular exercise.  Orders: -     Hepatitis B surface antibody,quantitative -     Hepatitis C antibody  Screening for cervical cancer -     Cytology - PAP     Return precautions given.   Risks, benefits, and alternatives of the medications and treatment plan prescribed today were discussed, and patient expressed understanding.   Education regarding symptom management and diagnosis given to patient on AVS either electronically or printed.  Return in about 6 months (around 05/03/2025).  Rollene Northern, FNP  Subjective:    [name], female    DOB: [birth year], 29 y.o.   MRN: [id number]  CC: Amy Olson is a 29 y.o. female who presents today for physical exam.    HPI: Feels well today.  No new complaints She has seen endocrinology, increased dose of mounjaro  12.5mg  She is seeing a dietitian.  She is pleased with her weight loss.     No first-degree relatives with breast or colon cancer.  Cervical Cancer Screening: due Prior Pap smear 02/08/2021 negative malignancy          Tetanus - UTD         Exercise: No regular exercise.   Alcohol use:  rare Smoking/tobacco use: Nonsmoker.    Health Maintenance  Topic Date Due   Hepatitis C Screening  Never done   Hepatitis B Vaccine (1 of 3 - 19+ 3-dose series) Never done   COVID-19 Vaccine (3 - 2025-26 season) 06/23/2024   Flu Shot  01/20/2025*   Pneumococcal Vaccine (1 of 2 - PCV) 11/03/2025*   Kidney health urinalysis for diabetes  03/02/2025   Hemoglobin A1C  03/02/2025   Yearly kidney function blood test for diabetes  09/02/2025   Complete foot exam   09/02/2025   Eye exam for diabetics  10/08/2025   DTaP/Tdap/Td vaccine (2 - Td or Tdap) 09/05/2028   Pap with HPV screening  11/03/2029   HPV Vaccine  Completed   HIV Screening  Completed   Meningitis B Vaccine  Aged  Out  *Topic was postponed. The date shown is not the original due date.    ALLERGIES: Augmentin  [amoxicillin -pot clavulanate]  Medications Ordered Prior to Encounter[1]  Review of Systems  Constitutional:  Negative for chills, fever and unexpected weight change.  HENT:  Negative for congestion.   Respiratory:  Negative for cough.   Cardiovascular:  Negative for chest pain, palpitations and leg swelling.  Gastrointestinal:  Negative for nausea and vomiting.  Musculoskeletal:  Negative for arthralgias and myalgias.  Skin:  Negative for rash.  Neurological:  Negative for headaches.  Hematological:  Negative for adenopathy.  Psychiatric/Behavioral:  Negative for confusion.       Objective:    BP 130/76   Pulse 87   Temp 98.2 F (36.8 C) (Oral)   Ht 5' 6 (1.676 m)   Wt 218 lb 3.2 oz (99 kg)   LMP  (LMP Unknown)   SpO2 98%   BMI 35.22 kg/m   BP Readings from Last 3 Encounters:  11/03/24 130/76  09/02/24 122/84  06/18/24 130/70   Wt Readings from Last 3 Encounters:  11/03/24 218 lb 3.2 oz (99 kg)  09/02/24 235 lb (106.6 kg)  06/18/24 226 lb (102.5 kg)    Physical Exam Vitals reviewed.  Constitutional:      Appearance: Normal appearance.  She is well-developed.  Eyes:     Conjunctiva/sclera: Conjunctivae normal.  Neck:     Thyroid : No thyroid  mass or thyromegaly.  Cardiovascular:     Rate and Rhythm: Normal rate and regular rhythm.     Pulses: Normal pulses.     Heart sounds: Normal heart sounds.  Pulmonary:     Effort: Pulmonary effort is normal.     Breath sounds: Normal breath sounds. No wheezing, rhonchi or rales.  Chest:  Breasts:    Breasts are symmetrical.     Right: No inverted nipple, mass, nipple discharge, skin change or tenderness.     Left: No inverted nipple, mass, nipple discharge, skin change or tenderness.  Abdominal:     General: Bowel sounds are normal. There is no distension.     Palpations: Abdomen is soft. Abdomen is not rigid. There is  no fluid wave or mass.     Tenderness: There is no abdominal tenderness. There is no guarding or rebound.  Genitourinary:    Cervix: No cervical motion tenderness, discharge or friability.     Uterus: Not enlarged, not fixed and not tender.      Adnexa:        Right: No mass, tenderness or fullness.         Left: No mass, tenderness or fullness.       Comments: Pap performed. No CMT. Unable to appreciated ovaries. Lymphadenopathy:     Head:     Right side of head: No submental, submandibular, tonsillar, preauricular, posterior auricular or occipital adenopathy.     Left side of head: No submental, submandibular, tonsillar, preauricular, posterior auricular or occipital adenopathy.     Cervical:     Right cervical: No superficial, deep or posterior cervical adenopathy.    Left cervical: No superficial, deep or posterior cervical adenopathy.     Upper Body:     Right upper body: No pectoral adenopathy.     Left upper body: No pectoral adenopathy.  Skin:    General: Skin is warm and dry.  Neurological:     Mental Status: She is alert.  Psychiatric:        Speech: Speech normal.        Behavior: Behavior normal.        Thought Content: Thought content normal.            [1]  Current Outpatient Medications on File Prior to Visit  Medication Sig Dispense Refill   albuterol  (PROAIR  HFA) 108 (90 Base) MCG/ACT inhaler Inhale 2 puffs into the lungs every 6 (six) hours as needed for wheezing or shortness of breath. 18 g 1   escitalopram  (LEXAPRO ) 10 MG tablet Take 1 tablet (10 mg total) by mouth daily. 90 tablet 3   levocetirizine (XYZAL) 5 MG tablet Take 5 mg by mouth every evening.     Levonorgestrel-Ethinyl Estradiol  (SIMPESSE) 0.15-0.03 &0.01 MG tablet Take 1 tablet by mouth daily. 91 tablet 3   losartan  (COZAAR ) 25 MG tablet TAKE 1 TABLET(25 MG) BY MOUTH DAILY 90 tablet 3   losartan  (COZAAR ) 50 MG tablet TAKE 1 TABLET(50 MG) BY MOUTH DAILY 90 tablet 3   metFORMIN  (GLUCOPHAGE ) 500  MG tablet TAKE 2 TABLETS(1000 MG) BY MOUTH TWICE DAILY WITH A MEAL 360 tablet 3   montelukast  (SINGULAIR ) 10 MG tablet TAKE 1 TABLET(10 MG) BY MOUTH AT BEDTIME 90 tablet 3   tirzepatide  (MOUNJARO ) 12.5 MG/0.5ML Pen Inject 12.5 mg into the skin once a week. 6 mL 3  No current facility-administered medications on file prior to visit.   "

## 2024-11-03 NOTE — Patient Instructions (Signed)

## 2024-11-04 LAB — CYTOLOGY - PAP
Comment: NEGATIVE
Diagnosis: NEGATIVE
High risk HPV: NEGATIVE

## 2024-11-05 ENCOUNTER — Ambulatory Visit: Payer: Self-pay | Admitting: Family

## 2024-11-07 NOTE — Assessment & Plan Note (Signed)
 Clinical breast exam performed today.  Pap smear obtained.  Encouraged regular exercise.

## 2024-11-08 LAB — SPECIMEN STATUS REPORT

## 2024-11-08 LAB — HEPATITIS B SURFACE ANTIBODY, QUANTITATIVE: Hepatitis B Surf Ab Quant: <3.5 m[IU]/mL — ABNORMAL LOW

## 2024-11-08 LAB — HEPATITIS C ANTIBODY: Hep C Virus Ab: NONREACTIVE

## 2024-11-11 NOTE — Telephone Encounter (Signed)
 Scheduled pt to get 1st dose on 11/17/24 @ 2pm

## 2024-11-17 ENCOUNTER — Ambulatory Visit

## 2024-11-21 ENCOUNTER — Ambulatory Visit

## 2024-11-21 DIAGNOSIS — Z23 Encounter for immunization: Secondary | ICD-10-CM

## 2024-11-21 NOTE — Progress Notes (Signed)
 Pt received Hepsilav B  injection in Left  deltoid muscle. Pt tolerated it well with no complaints or concerns.

## 2024-11-24 ENCOUNTER — Ambulatory Visit

## 2024-12-02 ENCOUNTER — Ambulatory Visit: Admitting: Endocrinology

## 2025-05-14 ENCOUNTER — Ambulatory Visit: Admitting: Family
